# Patient Record
Sex: Male | Born: 1950
Health system: Southern US, Community
[De-identification: ages and names within clinical notes are randomized; demographics above are authoritative.]

## PROBLEM LIST (undated history)

## (undated) DIAGNOSIS — G8929 Other chronic pain: Secondary | ICD-10-CM

## (undated) DIAGNOSIS — I10 Essential (primary) hypertension: Secondary | ICD-10-CM

## (undated) DIAGNOSIS — Z9981 Dependence on supplemental oxygen: Secondary | ICD-10-CM

## (undated) DIAGNOSIS — F419 Anxiety disorder, unspecified: Secondary | ICD-10-CM

## (undated) DIAGNOSIS — Z87891 Personal history of nicotine dependence: Secondary | ICD-10-CM

## (undated) DIAGNOSIS — J449 Chronic obstructive pulmonary disease, unspecified: Secondary | ICD-10-CM

## (undated) DIAGNOSIS — F1120 Opioid dependence, uncomplicated: Secondary | ICD-10-CM

## (undated) DIAGNOSIS — J9690 Respiratory failure, unspecified, unspecified whether with hypoxia or hypercapnia: Secondary | ICD-10-CM

## (undated) DIAGNOSIS — E785 Hyperlipidemia, unspecified: Secondary | ICD-10-CM

## (undated) DIAGNOSIS — I2510 Atherosclerotic heart disease of native coronary artery without angina pectoris: Secondary | ICD-10-CM

## (undated) HISTORY — DX: Atherosclerotic heart disease of native coronary artery without angina pectoris: I25.10

## (undated) HISTORY — DX: Respiratory failure, unspecified, unspecified whether with hypoxia or hypercapnia: J96.90

## (undated) HISTORY — DX: Anxiety disorder, unspecified: F41.9

## (undated) HISTORY — DX: Opioid dependence, uncomplicated: F11.20

## (undated) HISTORY — DX: Hyperlipidemia, unspecified: E78.5

## (undated) HISTORY — DX: Essential (primary) hypertension: I10

## (undated) HISTORY — DX: Other chronic pain: G89.29

## (undated) HISTORY — DX: Personal history of nicotine dependence: Z87.891

---

## 2002-07-22 ENCOUNTER — Encounter: Payer: Self-pay | Admitting: Family Medicine

## 2002-07-22 ENCOUNTER — Ambulatory Visit (HOSPITAL_COMMUNITY): Admission: RE | Admit: 2002-07-22 | Discharge: 2002-07-22 | Payer: Self-pay | Admitting: Family Medicine

## 2002-12-10 ENCOUNTER — Emergency Department (HOSPITAL_COMMUNITY): Admission: EM | Admit: 2002-12-10 | Discharge: 2002-12-10 | Payer: Self-pay | Admitting: *Deleted

## 2003-11-08 ENCOUNTER — Emergency Department (HOSPITAL_COMMUNITY): Admission: EM | Admit: 2003-11-08 | Discharge: 2003-11-08 | Payer: Self-pay | Admitting: Emergency Medicine

## 2004-11-17 ENCOUNTER — Emergency Department (HOSPITAL_COMMUNITY): Admission: EM | Admit: 2004-11-17 | Discharge: 2004-11-17 | Payer: Self-pay | Admitting: Emergency Medicine

## 2005-05-05 ENCOUNTER — Ambulatory Visit: Payer: Self-pay | Admitting: Cardiology

## 2005-05-06 ENCOUNTER — Ambulatory Visit (HOSPITAL_COMMUNITY): Admission: AD | Admit: 2005-05-06 | Discharge: 2005-05-06 | Payer: Self-pay | Admitting: Cardiology

## 2005-05-06 ENCOUNTER — Ambulatory Visit: Payer: Self-pay | Admitting: Cardiology

## 2006-12-14 ENCOUNTER — Ambulatory Visit (HOSPITAL_COMMUNITY): Admission: RE | Admit: 2006-12-14 | Discharge: 2006-12-14 | Payer: Self-pay | Admitting: Family Medicine

## 2007-06-15 ENCOUNTER — Ambulatory Visit (HOSPITAL_COMMUNITY): Admission: RE | Admit: 2007-06-15 | Discharge: 2007-06-15 | Payer: Self-pay | Admitting: Family Medicine

## 2008-06-22 ENCOUNTER — Ambulatory Visit (HOSPITAL_COMMUNITY): Admission: RE | Admit: 2008-06-22 | Discharge: 2008-06-22 | Payer: Self-pay | Admitting: Family Medicine

## 2008-07-20 HISTORY — PX: NM MYOCAR PERF WALL MOTION: HXRAD629

## 2008-07-20 HISTORY — PX: TRANSTHORACIC ECHOCARDIOGRAM: SHX275

## 2008-08-10 ENCOUNTER — Ambulatory Visit (HOSPITAL_COMMUNITY): Admission: RE | Admit: 2008-08-10 | Discharge: 2008-08-10 | Payer: Self-pay | Admitting: Cardiology

## 2008-08-10 HISTORY — PX: CARDIAC CATHETERIZATION: SHX172

## 2008-08-16 ENCOUNTER — Ambulatory Visit: Payer: Self-pay | Admitting: Orthopedic Surgery

## 2008-08-16 DIAGNOSIS — M7580 Other shoulder lesions, unspecified shoulder: Secondary | ICD-10-CM

## 2008-08-16 DIAGNOSIS — M25519 Pain in unspecified shoulder: Secondary | ICD-10-CM

## 2008-08-21 ENCOUNTER — Encounter: Payer: Self-pay | Admitting: Orthopedic Surgery

## 2008-08-21 ENCOUNTER — Encounter (HOSPITAL_COMMUNITY): Admission: RE | Admit: 2008-08-21 | Discharge: 2008-09-29 | Payer: Self-pay | Admitting: Orthopedic Surgery

## 2008-08-23 ENCOUNTER — Encounter: Payer: Self-pay | Admitting: Orthopedic Surgery

## 2008-09-20 ENCOUNTER — Encounter: Payer: Self-pay | Admitting: Orthopedic Surgery

## 2008-10-02 ENCOUNTER — Encounter (HOSPITAL_COMMUNITY): Admission: RE | Admit: 2008-10-02 | Discharge: 2008-11-01 | Payer: Self-pay | Admitting: Orthopedic Surgery

## 2008-10-04 ENCOUNTER — Encounter: Payer: Self-pay | Admitting: Orthopedic Surgery

## 2008-10-19 ENCOUNTER — Ambulatory Visit: Payer: Self-pay | Admitting: Orthopedic Surgery

## 2009-02-08 ENCOUNTER — Ambulatory Visit (HOSPITAL_COMMUNITY): Admission: RE | Admit: 2009-02-08 | Discharge: 2009-02-08 | Payer: Self-pay | Admitting: Family Medicine

## 2009-07-23 ENCOUNTER — Ambulatory Visit (HOSPITAL_COMMUNITY): Admission: RE | Admit: 2009-07-23 | Discharge: 2009-07-23 | Payer: Self-pay | Admitting: Family Medicine

## 2009-09-17 ENCOUNTER — Emergency Department (HOSPITAL_COMMUNITY): Admission: EM | Admit: 2009-09-17 | Discharge: 2009-09-17 | Payer: Self-pay | Admitting: Emergency Medicine

## 2010-02-04 ENCOUNTER — Ambulatory Visit (HOSPITAL_COMMUNITY)
Admission: RE | Admit: 2010-02-04 | Discharge: 2010-02-04 | Payer: Self-pay | Source: Home / Self Care | Admitting: General Surgery

## 2010-04-29 ENCOUNTER — Ambulatory Visit (HOSPITAL_COMMUNITY)
Admission: RE | Admit: 2010-04-29 | Discharge: 2010-04-29 | Payer: Self-pay | Source: Home / Self Care | Attending: Family Medicine | Admitting: Family Medicine

## 2010-07-04 LAB — CBC
Hemoglobin: 13.5 g/dL (ref 13.0–17.0)
MCH: 31.4 pg (ref 26.0–34.0)
MCHC: 34 g/dL (ref 30.0–36.0)
MCV: 92.4 fL (ref 78.0–100.0)
RBC: 4.31 MIL/uL (ref 4.22–5.81)

## 2010-07-04 LAB — BASIC METABOLIC PANEL
CO2: 27 mEq/L (ref 19–32)
Chloride: 105 mEq/L (ref 96–112)
GFR calc Af Amer: >60 mL/min (ref >60)
Glucose, Bld: 98 mg/dL (ref 70–99)
Sodium: 139 mEq/L (ref 135–145)

## 2010-07-31 LAB — POCT I-STAT 3, ART BLOOD GAS (G3+)
Bicarbonate: 23.1 mEq/L (ref 20.0–24.0)
O2 Saturation: 97 %
TCO2: 24 mmol/L (ref 0–100)
pCO2 arterial: 42.3 mmHg (ref 35.0–45.0)
pO2, Arterial: 99 mmHg (ref 80.0–100.0)

## 2010-07-31 LAB — POCT I-STAT 3, VENOUS BLOOD GAS (G3P V)
Acid-base deficit: 4 mmol/L — ABNORMAL HIGH (ref 0.0–2.0)
O2 Saturation: 68 %
TCO2: 24 mmol/L (ref 0–100)

## 2010-09-06 NOTE — Cardiovascular Report (Signed)
NAME:  [name], [name] NO.:  [id number]   MEDICAL RECORD NO.:  [id number]          PATIENT TYPE:  INP   LOCATION:  2857                         FACILITY:  [facility]   PHYSICIAN:  Rollene Rotunda, M.D.   DATE OF BIRTH:  1950-11-11   DATE OF PROCEDURE:  05/06/2005  DATE OF DISCHARGE:                              CARDIAC CATHETERIZATION   PROCEDURE:  Left heart catheterization/coronary arteriography.   INDICATIONS FOR PROCEDURE:  Patient with a chest pain suggestive of unstable  angina.  He had multiple cardiovascular risk factors.   PROCEDURE NOTE:  Left heart catheterization performed via the right femoral  artery.  The artery was cannulated using anterior wall puncture.  A #6  French arterial sheath was inserted via modified Seldinger technique.  Preformed Judkins and a pigtail catheter were utilized.  The groin was  closed.  The patient tolerated the procedure well and left the lab in stable  condition.   RESULTS:  Hemodynamics:  LV 108/12, AO 113/82.   Coronaries:  The left main was normal.  The LAD was a large vessel wrapping  the apex.  It was normal.  There was a  mid diagonal which was moderate size  and normal.  The circumflex was moderate size and normal throughout its  course.  There was an OM1 and OM2 which were both moderate size and normal.  The right coronary artery was a large dominant vessel.  The PDA was large  and normal.  The posterolateral was small and normal.   Left ventriculogram:  The left ventriculogram was obtained in the RAO  projection.  The EF was 60% and normal.   CONCLUSION:  Normal coronaries.  Normal left ventricular function.   PLAN:  No further cardiac workup is suggested.  The patient will follow up  with Dr. Dimas Aguas for primary risk reduction and discussion of non-anginal  chest pain.           ______________________________  Rollene Rotunda, M.D.     JH/MEDQ  D:  05/06/2005  T:  05/06/2005  Job:  [id number]   cc:   Selinda Flavin, M.D.

## 2010-09-06 NOTE — Discharge Summary (Signed)
NAME:  [name], [name]                 ACCOUNT NO.:  [id number]   MEDICAL RECORD NO.:  [id number]          PATIENT TYPE:  INP   LOCATION:  2857                         FACILITY:  [facility]   PHYSICIAN:  Gene Serpe, P.A. [facility]   DATE OF BIRTH:  July 14, 1950   DATE OF ADMISSION:  05/06/2005  DATE OF DISCHARGE:  05/06/2005                                 DISCHARGE SUMMARY   PRIMARY CARDIOLOGIST:  Learta Codding, M.D. [facility]   PRINCIPAL DIAGNOSES:  1.  Non-cardiac chest pain.      1.  Normal coronary angiogram on May 06, 2005.   SECONDARY DIAGNOSES:  1.  Chronic obstructive pulmonary disease/emphysema.  2.  Ongoing tobacco.   REASON FOR ADMISSION:  Mr. Schloemer is a 60 year old male, with no prior  cardiac history, who initially presented to [facility] with a  complaint of dizziness and chest pain. He was referred to Dr. Lewayne Bunting in  consultation. Serial cardiac markers were negative, but resting  electrocardiogram was abnormal with question of prior anterior septal  myocardial infarction. Dr. Andee Lineman, therefore, recommended subsequent  transfer for diagnostic coronary angiography.   Following transfer, the patient underwent coronary angiography by Dr. Rollene Rotunda (see report for full details) revealing normal coronary arteries  and normal left ventricular function.   No further cardiac workup was recommended by Dr. Antoine Poche. The patient was  cleared for discharge later the same day.   The patient was not on any regular medications prior to admission; he will  be discharged on the same.   FOLLOWUP:  The patient will follow up with Dr. Lewayne Bunting in [place] on January  30th at 2:30 p.m.   The patient also instructed to arrange follow-up with Dr. Selinda Flavin, to  establish as her primary care physician.   The patient is strongly urged to stop smoking tobacco.   DISCHARGE DURATION:  Less than 30 minutes.      Gene Serpe, P.A. [facility]     GS/MEDQ  D:  05/06/2005  T:   05/06/2005  Job:  [id number]

## 2011-06-03 DIAGNOSIS — G8929 Other chronic pain: Secondary | ICD-10-CM | POA: Diagnosis not present

## 2011-06-03 DIAGNOSIS — Z6825 Body mass index (BMI) 25.0-25.9, adult: Secondary | ICD-10-CM | POA: Diagnosis not present

## 2011-06-25 DIAGNOSIS — J449 Chronic obstructive pulmonary disease, unspecified: Secondary | ICD-10-CM | POA: Diagnosis not present

## 2011-06-25 DIAGNOSIS — E782 Mixed hyperlipidemia: Secondary | ICD-10-CM | POA: Diagnosis not present

## 2011-06-30 DIAGNOSIS — M542 Cervicalgia: Secondary | ICD-10-CM | POA: Diagnosis not present

## 2011-06-30 DIAGNOSIS — Z6825 Body mass index (BMI) 25.0-25.9, adult: Secondary | ICD-10-CM | POA: Diagnosis not present

## 2011-06-30 DIAGNOSIS — I2510 Atherosclerotic heart disease of native coronary artery without angina pectoris: Secondary | ICD-10-CM | POA: Diagnosis not present

## 2011-06-30 DIAGNOSIS — J449 Chronic obstructive pulmonary disease, unspecified: Secondary | ICD-10-CM | POA: Diagnosis not present

## 2011-06-30 DIAGNOSIS — G8929 Other chronic pain: Secondary | ICD-10-CM | POA: Diagnosis not present

## 2011-09-22 DIAGNOSIS — Z6826 Body mass index (BMI) 26.0-26.9, adult: Secondary | ICD-10-CM | POA: Diagnosis not present

## 2011-09-22 DIAGNOSIS — I2510 Atherosclerotic heart disease of native coronary artery without angina pectoris: Secondary | ICD-10-CM | POA: Diagnosis not present

## 2011-09-22 DIAGNOSIS — J449 Chronic obstructive pulmonary disease, unspecified: Secondary | ICD-10-CM | POA: Diagnosis not present

## 2011-09-22 DIAGNOSIS — G8929 Other chronic pain: Secondary | ICD-10-CM | POA: Diagnosis not present

## 2011-11-28 DIAGNOSIS — M6200 Separation of muscle (nontraumatic), unspecified site: Secondary | ICD-10-CM | POA: Diagnosis not present

## 2011-11-28 DIAGNOSIS — I2510 Atherosclerotic heart disease of native coronary artery without angina pectoris: Secondary | ICD-10-CM | POA: Diagnosis not present

## 2011-12-23 DIAGNOSIS — J449 Chronic obstructive pulmonary disease, unspecified: Secondary | ICD-10-CM | POA: Diagnosis not present

## 2011-12-23 DIAGNOSIS — I2510 Atherosclerotic heart disease of native coronary artery without angina pectoris: Secondary | ICD-10-CM | POA: Diagnosis not present

## 2012-01-22 DIAGNOSIS — G8929 Other chronic pain: Secondary | ICD-10-CM | POA: Diagnosis not present

## 2012-01-22 DIAGNOSIS — Z23 Encounter for immunization: Secondary | ICD-10-CM | POA: Diagnosis not present

## 2012-01-22 DIAGNOSIS — B9789 Other viral agents as the cause of diseases classified elsewhere: Secondary | ICD-10-CM | POA: Diagnosis not present

## 2012-01-22 DIAGNOSIS — J069 Acute upper respiratory infection, unspecified: Secondary | ICD-10-CM | POA: Diagnosis not present

## 2012-03-29 DIAGNOSIS — I2510 Atherosclerotic heart disease of native coronary artery without angina pectoris: Secondary | ICD-10-CM | POA: Diagnosis not present

## 2012-03-29 DIAGNOSIS — R42 Dizziness and giddiness: Secondary | ICD-10-CM | POA: Diagnosis not present

## 2012-03-29 DIAGNOSIS — M549 Dorsalgia, unspecified: Secondary | ICD-10-CM | POA: Diagnosis not present

## 2012-03-29 DIAGNOSIS — G8929 Other chronic pain: Secondary | ICD-10-CM | POA: Diagnosis not present

## 2012-06-21 DIAGNOSIS — J0100 Acute maxillary sinusitis, unspecified: Secondary | ICD-10-CM | POA: Diagnosis not present

## 2012-06-21 DIAGNOSIS — J209 Acute bronchitis, unspecified: Secondary | ICD-10-CM | POA: Diagnosis not present

## 2012-07-13 DIAGNOSIS — E782 Mixed hyperlipidemia: Secondary | ICD-10-CM | POA: Diagnosis not present

## 2012-07-13 DIAGNOSIS — J449 Chronic obstructive pulmonary disease, unspecified: Secondary | ICD-10-CM | POA: Diagnosis not present

## 2012-07-14 DIAGNOSIS — E782 Mixed hyperlipidemia: Secondary | ICD-10-CM | POA: Diagnosis not present

## 2012-09-20 DIAGNOSIS — Z0000 Encounter for general adult medical examination without abnormal findings: Secondary | ICD-10-CM | POA: Diagnosis not present

## 2012-09-20 DIAGNOSIS — G43909 Migraine, unspecified, not intractable, without status migrainosus: Secondary | ICD-10-CM | POA: Diagnosis not present

## 2012-09-20 DIAGNOSIS — Z79899 Other long term (current) drug therapy: Secondary | ICD-10-CM | POA: Diagnosis not present

## 2012-09-20 DIAGNOSIS — J449 Chronic obstructive pulmonary disease, unspecified: Secondary | ICD-10-CM | POA: Diagnosis not present

## 2012-09-20 DIAGNOSIS — G8929 Other chronic pain: Secondary | ICD-10-CM | POA: Diagnosis not present

## 2012-09-20 DIAGNOSIS — J45909 Unspecified asthma, uncomplicated: Secondary | ICD-10-CM | POA: Diagnosis not present

## 2012-09-20 DIAGNOSIS — Z125 Encounter for screening for malignant neoplasm of prostate: Secondary | ICD-10-CM | POA: Diagnosis not present

## 2012-09-20 DIAGNOSIS — Z6827 Body mass index (BMI) 27.0-27.9, adult: Secondary | ICD-10-CM | POA: Diagnosis not present

## 2012-10-25 ENCOUNTER — Other Ambulatory Visit: Payer: Self-pay | Admitting: *Deleted

## 2012-10-25 MED ORDER — SIMVASTATIN 20 MG PO TABS: 20.0000 mg | ORAL_TABLET | Freq: Every day | ORAL | Status: DC

## 2013-01-04 DIAGNOSIS — I2510 Atherosclerotic heart disease of native coronary artery without angina pectoris: Secondary | ICD-10-CM | POA: Diagnosis not present

## 2013-01-04 DIAGNOSIS — J449 Chronic obstructive pulmonary disease, unspecified: Secondary | ICD-10-CM | POA: Diagnosis not present

## 2013-01-04 DIAGNOSIS — Z6826 Body mass index (BMI) 26.0-26.9, adult: Secondary | ICD-10-CM | POA: Diagnosis not present

## 2013-01-04 DIAGNOSIS — R259 Unspecified abnormal involuntary movements: Secondary | ICD-10-CM | POA: Diagnosis not present

## 2013-01-04 DIAGNOSIS — G259 Extrapyramidal and movement disorder, unspecified: Secondary | ICD-10-CM | POA: Diagnosis not present

## 2013-01-20 DIAGNOSIS — Z23 Encounter for immunization: Secondary | ICD-10-CM | POA: Diagnosis not present

## 2013-01-22 ENCOUNTER — Emergency Department (HOSPITAL_COMMUNITY)
Admission: EM | Admit: 2013-01-22 | Discharge: 2013-01-22 | Disposition: A | Discharge status: Home / Self Care | Payer: Medicare Other | Attending: Emergency Medicine | Admitting: Emergency Medicine

## 2013-01-22 ENCOUNTER — Encounter (HOSPITAL_COMMUNITY): Payer: Self-pay | Admitting: *Deleted

## 2013-01-22 DIAGNOSIS — Z79899 Other long term (current) drug therapy: Secondary | ICD-10-CM | POA: Insufficient documentation

## 2013-01-22 DIAGNOSIS — J449 Chronic obstructive pulmonary disease, unspecified: Secondary | ICD-10-CM | POA: Insufficient documentation

## 2013-01-22 DIAGNOSIS — Z87891 Personal history of nicotine dependence: Secondary | ICD-10-CM | POA: Insufficient documentation

## 2013-01-22 DIAGNOSIS — R42 Dizziness and giddiness: Secondary | ICD-10-CM | POA: Diagnosis not present

## 2013-01-22 DIAGNOSIS — J40 Bronchitis, not specified as acute or chronic: Secondary | ICD-10-CM

## 2013-01-22 DIAGNOSIS — J209 Acute bronchitis, unspecified: Secondary | ICD-10-CM | POA: Diagnosis not present

## 2013-01-22 DIAGNOSIS — J4489 Other specified chronic obstructive pulmonary disease: Secondary | ICD-10-CM | POA: Insufficient documentation

## 2013-01-22 HISTORY — DX: Chronic obstructive pulmonary disease, unspecified: J44.9

## 2013-01-22 MED ORDER — AZITHROMYCIN 250 MG PO TABS: ORAL_TABLET | ORAL | Status: DC

## 2013-01-22 MED ORDER — GUAIFENESIN-CODEINE 100-10 MG/5ML PO SYRP: 10.0000 mL | ORAL_SOLUTION | Freq: Three times a day (TID) | ORAL | Status: DC | PRN

## 2013-01-22 MED ORDER — ALBUTEROL SULFATE HFA 108 (90 BASE) MCG/ACT IN AERS
2.0000 | INHALATION_SPRAY | Freq: Once | RESPIRATORY_TRACT | Status: AC
  Administered 2013-01-22: 2 via RESPIRATORY_TRACT
  Filled 2013-01-22: qty 6.7

## 2013-01-22 NOTE — ED Notes (Signed)
Started w/cough Wednesday, went to [facility] Thursday and recv'd flu shot.  Denies fever, chills.  Productive cough w/yellow blood streaked phlegm. No night sweats.  Also reports dizziness.

## 2013-01-24 NOTE — ED Provider Notes (Signed)
CSN: [id number]     Arrival date & time 01/22/13  1315 History   First MD Initiated Contact with Patient 01/22/13 1347     Chief Complaint  Patient presents with  . Cough  . Dizziness   (Consider location/radiation/quality/duration/timing/severity/associated sxs/prior Treatment) Patient is a 62 y.o. male presenting with cough. The history is provided by the patient.  Cough Cough characteristics:  Productive Sputum characteristics:  Yellow (mixed with few streaks of brigh red blood) Severity:  Moderate Onset quality:  Gradual Duration:  3 days Timing:  Constant Progression:  Unchanged Chronicity:  New Smoker: former smoker.   Context: upper respiratory infection   Context: not sick contacts   Worsened by:  Activity Ineffective treatments:  None tried Associated symptoms: rhinorrhea   Associated symptoms: no chest pain, no chills, no diaphoresis, no ear pain, no fever, no headaches, no myalgias, no rash, no shortness of breath, no sinus congestion, no sore throat, no weight loss and no wheezing   Associated symptoms comment:  Patient also reports dizziness with excessive cough.  He denies night sweats, wt loss or hempytosis Rhinorrhea:    Quality:  Clear   Severity:  Mild   Timing:  Intermittent   Progression:  Unchanged   Past Medical History  Diagnosis Date  . COPD (chronic obstructive pulmonary disease)     emphysema   History reviewed. No pertinent past surgical history. History reviewed. No pertinent family history. History  Substance Use Topics  . Smoking status: Former Games developer  . Smokeless tobacco: Not on file  . Alcohol Use: No    Review of Systems  Constitutional: Negative for fever, chills, weight loss, diaphoresis, activity change and appetite change.  HENT: Positive for congestion and rhinorrhea. Negative for ear pain, sore throat, facial swelling, trouble swallowing, neck pain and neck stiffness.   Eyes: Negative for visual disturbance.  Respiratory:  Positive for cough. Negative for chest tightness, shortness of breath, wheezing and stridor.   Cardiovascular: Negative for chest pain and leg swelling.  Gastrointestinal: Negative for nausea, vomiting, abdominal pain and abdominal distention.  Genitourinary: Negative for flank pain.  Musculoskeletal: Negative for myalgias.  Skin: Negative.  Negative for rash.  Neurological: Positive for dizziness. Negative for seizures, syncope, weakness, numbness and headaches.  Hematological: Negative for adenopathy.  Psychiatric/Behavioral: Negative for confusion.  All other systems reviewed and are negative.    Allergies  Review of patient's allergies indicates no known allergies.  Home Medications   Current Outpatient Rx  Name  Route  Sig  Dispense  Refill  . albuterol (PROVENTIL HFA;VENTOLIN HFA) 108 (90 BASE) MCG/ACT inhaler   Inhalation   Inhale 2 puffs into the lungs every 6 (six) hours as needed for wheezing or shortness of breath.         Marland Kitchen albuterol (PROVENTIL) 2 MG tablet   Oral   Take 2 mg by mouth 2 (two) times daily.         Marland Kitchen omeprazole (PRILOSEC) 40 MG capsule   Oral   Take 40 mg by mouth daily.          Marland Kitchen oxyCODONE-acetaminophen (PERCOCET/ROXICET) 5-325 MG per tablet   Oral   Take 1-2 tablets by mouth every 6 (six) hours as needed.          . simvastatin (ZOCOR) 20 MG tablet   Oral   Take 1 tablet (20 mg total) by mouth at bedtime.   30 tablet   6   . zolpidem (AMBIEN) 10 MG tablet  Oral   Take 10 mg by mouth at bedtime as needed.          Marland Kitchen azithromycin (ZITHROMAX Z-PAK) 250 MG tablet      Take two tablets on day one, then one tab qd days 2-5   6 tablet   0   . guaiFENesin-codeine (ROBITUSSIN AC) 100-10 MG/5ML syrup   Oral   Take 10 mLs by mouth 3 (three) times daily as needed for cough.   120 mL   0    BP 125/86  Pulse 87  Temp(Src) 98.2 F (36.8 C) (Oral)  Resp 17  Ht 5\' 9"  (1.753 m)  Wt 183 lb (83.008 kg)  BMI 27.01 kg/m2  SpO2  94% Physical Exam  Nursing note and vitals reviewed. Constitutional: He is oriented to person, place, and time. He appears well-developed and well-nourished. No distress.  HENT:  Head: Normocephalic and atraumatic.  Right Ear: Tympanic membrane and ear canal normal.  Left Ear: Tympanic membrane and ear canal normal.  Nose: Rhinorrhea present. No mucosal edema. Right sinus exhibits no frontal sinus tenderness. Left sinus exhibits no frontal sinus tenderness.  Mouth/Throat: Uvula is midline, oropharynx is clear and moist and mucous membranes are normal. No oropharyngeal exudate.  Eyes: EOM are normal. Pupils are equal, round, and reactive to light.  Neck: Normal range of motion, full passive range of motion without pain and phonation normal. Neck supple.  Cardiovascular: Normal rate, regular rhythm, normal heart sounds and intact distal pulses.   No murmur heard. Pulmonary/Chest: Effort normal. No stridor. No respiratory distress. He has no wheezes. He has no rales. He exhibits no tenderness.  Coarse lungs sounds bilaterally. No wheezing or rales  Abdominal: Soft. He exhibits no distension and no mass. There is no tenderness. There is no rebound and no guarding.  Musculoskeletal: He exhibits no edema.  Lymphadenopathy:    He has no cervical adenopathy.  Neurological: He is alert and oriented to person, place, and time. He exhibits normal muscle tone. Coordination normal.  Skin: Skin is warm and dry.    ED Course  Procedures (including critical care time) Labs Review Labs Reviewed - No data to display Imaging Review No results found.  MDM   1. Bronchitis      VSS on recheck.  No concerning sx's for TB or PE.  Former smoker with hx of COPD and likely bronchitis.  Will treat with inhaler, zithromax and robitussin AC.  Pt agrees to care plan and appears stable for discharge.  Also agrees to close f/u with his PMD or to return here if his sx's worsen   Vernesha Talbot L. Airica Schwartzkopf,  PA-C 01/24/13 1506

## 2013-01-26 NOTE — ED Provider Notes (Signed)
Medical screening examination/treatment/procedure(s) were performed by non-physician practitioner and as supervising physician I was immediately available for consultation/collaboration.   Roney Marion, MD 01/26/13 [phone]

## 2013-04-05 DIAGNOSIS — J45909 Unspecified asthma, uncomplicated: Secondary | ICD-10-CM | POA: Diagnosis not present

## 2013-04-05 DIAGNOSIS — G4700 Insomnia, unspecified: Secondary | ICD-10-CM | POA: Diagnosis not present

## 2013-04-05 DIAGNOSIS — Z6826 Body mass index (BMI) 26.0-26.9, adult: Secondary | ICD-10-CM | POA: Diagnosis not present

## 2013-04-05 DIAGNOSIS — J449 Chronic obstructive pulmonary disease, unspecified: Secondary | ICD-10-CM | POA: Diagnosis not present

## 2013-04-05 DIAGNOSIS — I2510 Atherosclerotic heart disease of native coronary artery without angina pectoris: Secondary | ICD-10-CM | POA: Diagnosis not present

## 2013-04-12 DIAGNOSIS — Z6826 Body mass index (BMI) 26.0-26.9, adult: Secondary | ICD-10-CM | POA: Diagnosis not present

## 2013-04-12 DIAGNOSIS — I10 Essential (primary) hypertension: Secondary | ICD-10-CM | POA: Diagnosis not present

## 2013-05-20 DIAGNOSIS — Z6826 Body mass index (BMI) 26.0-26.9, adult: Secondary | ICD-10-CM | POA: Diagnosis not present

## 2013-05-20 DIAGNOSIS — G8929 Other chronic pain: Secondary | ICD-10-CM | POA: Diagnosis not present

## 2013-06-21 ENCOUNTER — Encounter: Payer: Self-pay | Admitting: *Deleted

## 2013-06-22 ENCOUNTER — Encounter: Payer: Self-pay | Admitting: Internal Medicine

## 2013-06-23 ENCOUNTER — Encounter: Payer: Self-pay | Admitting: Internal Medicine

## 2013-06-23 ENCOUNTER — Ambulatory Visit (INDEPENDENT_AMBULATORY_CARE_PROVIDER_SITE_OTHER): Payer: Medicare Other | Admitting: Internal Medicine

## 2013-06-23 VITALS — BP 144/86 | HR 71 | Ht 69.0 in | Wt 186.5 lb

## 2013-06-23 DIAGNOSIS — E785 Hyperlipidemia, unspecified: Secondary | ICD-10-CM | POA: Diagnosis not present

## 2013-06-23 DIAGNOSIS — J449 Chronic obstructive pulmonary disease, unspecified: Secondary | ICD-10-CM | POA: Diagnosis not present

## 2013-06-23 MED ORDER — SIMVASTATIN 20 MG PO TABS: 20.0000 mg | ORAL_TABLET | Freq: Every day | ORAL | Status: DC

## 2013-06-23 NOTE — Patient Instructions (Signed)
Your physician wants you to follow-up in: 1 year. You will receive a reminder letter in the mail two months in advance. If you don't receive a letter, please call our office to schedule the follow-up appointment.  Please have Dr. Sherwood GamblerFusco send us your lab results - fax is [phone]

## 2013-06-23 NOTE — Progress Notes (Signed)
OFFICE NOTE  Chief Complaint:  Occasional dyspnea  Primary Care Physician: Cassell SmilesFUSCO,[name], MD  HPI:  [name]  is a 63 year old gentleman with history of COPD, prior smoker who quit in 2009, also cardiac catheterization which was negative in 2010, and dyslipidemia. He has had problems with sleep at night and is on Ambien, basically dependent on that medication now. Otherwise, he has had no chest pain, worsening shortness of breath, palpitations, presyncope, or syncopal symptoms.   PMHx:  Past Medical History  Diagnosis Date  . COPD (chronic obstructive pulmonary disease)     emphysema  . History of tobacco abuse     80 pack year history   . Dyslipidemia     Past Surgical History  Procedure Laterality Date  . Cardiac catheterization  08/10/2008    right & left - normal coronaries (Dr. Claudia DesanctisH. Solomon)  . Transthoracic echocardiogram  07/2008    EF=>55%; RV mildly dilated; RA mild-mod dilated; mild mitral annular calcification & mild MR; AV mildly sclerotic  . Nm myocar perf wall motion  07/2008    bruce myoview - mild ischemia in basal inferoseptal & mid inferoseptal regions; EF 63%    FAMHx:  Family History  Problem Relation Age of Onset  . Leukemia Mother   . Suicidality Father     SOCHx:   reports that he quit smoking about 6 years ago. He has never used smokeless tobacco. He reports that he does not drink alcohol or use illicit drugs.  ALLERGIES:  No Known Allergies  ROS: A comprehensive review of systems was negative except for: Respiratory: positive for chronic bronchitis and dyspnea on exertion  HOME MEDS: Current Outpatient Prescriptions  Medication Sig Dispense Refill  . albuterol (PROVENTIL HFA;VENTOLIN HFA) 108 (90 BASE) MCG/ACT inhaler Inhale 2 puffs into the lungs every 6 (six) hours as needed for wheezing or shortness of breath.      Marland Kitchen. albuterol (PROVENTIL) 2 MG tablet Take 2 mg by mouth 2 (two) times daily.      Marland Kitchen. omeprazole (PRILOSEC) 40 MG  capsule Take 40 mg by mouth daily.       Marland Kitchen. oxyCODONE-acetaminophen (PERCOCET/ROXICET) 5-325 MG per tablet Take 1-2 tablets by mouth every 6 (six) hours as needed.       . simvastatin (ZOCOR) 20 MG tablet Take 1 tablet (20 mg total) by mouth at bedtime.  30 tablet  3  . zolpidem (AMBIEN) 10 MG tablet Take 10 mg by mouth at bedtime as needed.        No current facility-administered medications for this visit.    LABS/IMAGING: No results found for this or any previous visit (from the past 48 hour(s)). No results found.  VITALS: BP 144/86  Pulse 71  Ht 5\' 9"  (1.753 m)  Wt 186 lb 8 oz (84.596 kg)  BMI 27.53 kg/m2  EXAM: General appearance: alert and no distress Neck: no carotid bruit and no JVD Lungs: clear to auscultation bilaterally Heart: regular rate and rhythm, S1, S2 normal, no murmur, click, rub or gallop Extremities: extremities normal, atraumatic, no cyanosis or edema Pulses: 2+ and symmetric Skin: Skin color, texture, turgor normal. No rashes or lesions Neurologic: Grossly normal  EKG: Normal sinus rhythm at 71  ASSESSMENT: 1. Dyslipidemia 2. COPD  PLAN: 1.   Mr. Harriette BouillonStarnes is doing fairly well. He is scheduled to see his primary care provider next week and will have another lipid profile done. I've asked them to send us a copy of  those results. He is having no chest pain and is physically active. He reports his shortness of breath is much better than it was several years ago, but he was smoking at the time. Plan to see him back annually or sooner as necessary.  Chrystie Nose, MD, [facility] Attending Cardiologist [facility]  HILTY,Kenneth C 06/23/2013, 9:55 AM

## 2013-06-30 DIAGNOSIS — I2510 Atherosclerotic heart disease of native coronary artery without angina pectoris: Secondary | ICD-10-CM | POA: Diagnosis not present

## 2013-06-30 DIAGNOSIS — G8929 Other chronic pain: Secondary | ICD-10-CM | POA: Diagnosis not present

## 2013-06-30 DIAGNOSIS — G4700 Insomnia, unspecified: Secondary | ICD-10-CM | POA: Diagnosis not present

## 2013-06-30 DIAGNOSIS — Z6826 Body mass index (BMI) 26.0-26.9, adult: Secondary | ICD-10-CM | POA: Diagnosis not present

## 2013-09-17 ENCOUNTER — Emergency Department (HOSPITAL_COMMUNITY)
Admission: EM | Admit: 2013-09-17 | Discharge: 2013-09-17 | Disposition: A | Discharge status: Home / Self Care | Payer: Medicare HMO | Attending: Emergency Medicine | Admitting: Emergency Medicine

## 2013-09-17 ENCOUNTER — Other Ambulatory Visit: Payer: Self-pay

## 2013-09-17 ENCOUNTER — Encounter (HOSPITAL_COMMUNITY): Payer: Self-pay | Admitting: Emergency Medicine

## 2013-09-17 DIAGNOSIS — E785 Hyperlipidemia, unspecified: Secondary | ICD-10-CM | POA: Insufficient documentation

## 2013-09-17 DIAGNOSIS — F411 Generalized anxiety disorder: Secondary | ICD-10-CM | POA: Insufficient documentation

## 2013-09-17 DIAGNOSIS — J438 Other emphysema: Secondary | ICD-10-CM | POA: Insufficient documentation

## 2013-09-17 DIAGNOSIS — Z9889 Other specified postprocedural states: Secondary | ICD-10-CM | POA: Insufficient documentation

## 2013-09-17 DIAGNOSIS — R1012 Left upper quadrant pain: Secondary | ICD-10-CM | POA: Diagnosis not present

## 2013-09-17 DIAGNOSIS — R109 Unspecified abdominal pain: Secondary | ICD-10-CM

## 2013-09-17 DIAGNOSIS — R002 Palpitations: Secondary | ICD-10-CM | POA: Diagnosis not present

## 2013-09-17 DIAGNOSIS — F419 Anxiety disorder, unspecified: Secondary | ICD-10-CM

## 2013-09-17 DIAGNOSIS — Z79899 Other long term (current) drug therapy: Secondary | ICD-10-CM | POA: Insufficient documentation

## 2013-09-17 DIAGNOSIS — Z87891 Personal history of nicotine dependence: Secondary | ICD-10-CM | POA: Insufficient documentation

## 2013-09-17 LAB — COMPREHENSIVE METABOLIC PANEL
ALBUMIN: 3.7 g/dL (ref 3.5–5.2)
ALK PHOS: 90 U/L (ref 39–117)
ALT: 27 U/L (ref 0–53)
AST: 19 U/L (ref 0–37)
BILIRUBIN TOTAL: 0.4 mg/dL (ref 0.3–1.2)
BUN: 13 mg/dL (ref 6–23)
CHLORIDE: 103 meq/L (ref 96–112)
CO2: 23 mEq/L (ref 19–32)
CREATININE: 0.95 mg/dL (ref 0.50–1.35)
Calcium: 9.1 mg/dL (ref 8.4–10.5)
GFR calc non Af Amer: 87 mL/min — ABNORMAL LOW (ref >90)
GLUCOSE: 142 mg/dL — AB (ref 70–99)
POTASSIUM: 3.8 meq/L (ref 3.7–5.3)
Sodium: 140 mEq/L (ref 137–147)
Total Protein: 6.6 g/dL (ref 6.0–8.3)

## 2013-09-17 LAB — CBC WITH DIFFERENTIAL/PLATELET
BASOS PCT: 1 % (ref 0–1)
Basophils Absolute: 0 10*3/uL (ref 0.0–0.1)
EOS ABS: 0.1 10*3/uL (ref 0.0–0.7)
Eosinophils Relative: 2 % (ref 0–5)
HEMATOCRIT: 40 % (ref 39.0–52.0)
HEMOGLOBIN: 13.8 g/dL (ref 13.0–17.0)
LYMPHS ABS: 1 10*3/uL (ref 0.7–4.0)
Lymphocytes Relative: 19 % (ref 12–46)
MCH: 30.9 pg (ref 26.0–34.0)
MCHC: 34.5 g/dL (ref 30.0–36.0)
MCV: 89.7 fL (ref 78.0–100.0)
MONO ABS: 0.4 10*3/uL (ref 0.1–1.0)
MONOS PCT: 7 % (ref 3–12)
NEUTROS ABS: 3.9 10*3/uL (ref 1.7–7.7)
Neutrophils Relative %: 71 % (ref 43–77)
Platelets: 271 10*3/uL (ref 150–400)
RBC: 4.46 MIL/uL (ref 4.22–5.81)
RDW: 12.7 % (ref 11.5–15.5)
WBC: 5.5 10*3/uL (ref 4.0–10.5)

## 2013-09-17 LAB — LIPASE, BLOOD: LIPASE: 36 U/L (ref 11–59)

## 2013-09-17 LAB — TROPONIN I

## 2013-09-17 MED ORDER — LORAZEPAM 1 MG PO TABS: 1.0000 mg | ORAL_TABLET | Freq: Two times a day (BID) | ORAL | Status: DC | PRN

## 2013-09-17 MED ORDER — LORAZEPAM 1 MG PO TABS
1.0000 mg | ORAL_TABLET | Freq: Once | ORAL | Status: AC
  Administered 2013-09-17: 1 mg via ORAL
  Filled 2013-09-17: qty 1
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
## 2013-09-17 NOTE — Discharge Instructions (Signed)
Tests were normal. Small prescription for anxiety medicine.  Uses medication sparingly as it can be addictive.

## 2013-09-17 NOTE — ED Notes (Signed)
Pt c/o left upper quad abd pain that started thirty minutes ago with suden onset, pt also states that he felt like his heart was beating fast and became sob when the pain started,

## 2013-10-13 ENCOUNTER — Ambulatory Visit (INDEPENDENT_AMBULATORY_CARE_PROVIDER_SITE_OTHER): Payer: Medicare HMO | Admitting: Cardiovascular Disease

## 2013-10-13 ENCOUNTER — Encounter: Payer: Self-pay | Admitting: Cardiovascular Disease

## 2013-10-13 ENCOUNTER — Encounter: Payer: Self-pay | Admitting: *Deleted

## 2013-10-13 VITALS — BP 130/80 | HR 70 | Resp 11 | Ht 69.0 in | Wt 191.0 lb

## 2013-10-13 DIAGNOSIS — F411 Generalized anxiety disorder: Secondary | ICD-10-CM

## 2013-10-13 DIAGNOSIS — R079 Chest pain, unspecified: Secondary | ICD-10-CM

## 2013-10-13 DIAGNOSIS — E785 Hyperlipidemia, unspecified: Secondary | ICD-10-CM

## 2013-10-13 DIAGNOSIS — J42 Unspecified chronic bronchitis: Secondary | ICD-10-CM

## 2013-10-13 DIAGNOSIS — I2510 Atherosclerotic heart disease of native coronary artery without angina pectoris: Secondary | ICD-10-CM | POA: Insufficient documentation

## 2013-10-13 DIAGNOSIS — F419 Anxiety disorder, unspecified: Secondary | ICD-10-CM

## 2013-10-13 MED ORDER — SIMVASTATIN 20 MG PO TABS: 20.0000 mg | ORAL_TABLET | Freq: Every day | ORAL | Status: DC
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
## 2013-10-13 NOTE — Assessment & Plan Note (Signed)
Chronic issue  Continue ambian and valium  F/U [employer]co

## 2013-10-13 NOTE — Assessment & Plan Note (Signed)
Zocor not on primary med list.  No recent labs  Started by Dr Rennis GoldenHilty  Script called in will get fasting lipids when he comes for myovue

## 2013-10-13 NOTE — Assessment & Plan Note (Signed)
Clear lungs with no wheezing  Quit smoking Yearly CXR proair and albuterol as needed
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
## 2013-10-13 NOTE — Patient Instructions (Signed)
Your physician wants you to follow-up in:   1 year with Dr.Hilty You will receive a reminder letter in the mail two months in advance. If you don't receive a letter, please call our office to schedule the follow-up appointment.     Your physician recommends that you continue on your current medications as directed. Please refer to the Current Medication list given to you today.    Your physician has requested that you have en exercise stress myoview. For further information please visit [email or web address]. Please follow instruction sheet, as given.     Thank you for choosing [facility] !

## 2013-10-19 ENCOUNTER — Encounter (HOSPITAL_COMMUNITY)
Admission: RE | Admit: 2013-10-19 | Discharge: 2013-10-19 | Disposition: A | Discharge status: Home / Self Care | Payer: Medicare HMO | Source: Ambulatory Visit | Attending: Cardiovascular Disease | Admitting: Cardiovascular Disease

## 2013-10-19 ENCOUNTER — Encounter (HOSPITAL_COMMUNITY): Payer: Self-pay

## 2013-10-19 ENCOUNTER — Ambulatory Visit (HOSPITAL_COMMUNITY)
Admission: RE | Admit: 2013-10-19 | Discharge: 2013-10-19 | Disposition: A | Discharge status: Home / Self Care | Payer: Medicare HMO | Source: Ambulatory Visit | Attending: Cardiovascular Disease | Admitting: Cardiovascular Disease

## 2013-10-19 DIAGNOSIS — F411 Generalized anxiety disorder: Secondary | ICD-10-CM | POA: Insufficient documentation

## 2013-10-19 DIAGNOSIS — R079 Chest pain, unspecified: Secondary | ICD-10-CM

## 2013-10-19 LAB — LIPID PANEL
CHOL/HDL RATIO: 3.3 ratio
CHOLESTEROL: 137 mg/dL (ref 0–200)
HDL: 42 mg/dL (ref >39)
LDL Cholesterol: 77 mg/dL (ref 0–99)
TRIGLYCERIDES: 91 mg/dL (ref <150)
VLDL: 18 mg/dL (ref 0–40)

## 2013-10-19 MED ORDER — SODIUM CHLORIDE 0.9 % IJ SOLN
INTRAMUSCULAR | Status: AC
  Administered 2013-10-19: 10 mL via INTRAVENOUS
  Filled 2013-10-19: qty 10

## 2013-10-19 MED ORDER — REGADENOSON 0.4 MG/5ML IV SOLN
0.4000 mg | Freq: Once | INTRAVENOUS | Status: AC | PRN
  Administered 2013-10-19: 0.4 mg via INTRAVENOUS

## 2013-10-19 MED ORDER — TECHNETIUM TC 99M SESTAMIBI GENERIC - CARDIOLITE
10.0000 | Freq: Once | INTRAVENOUS | Status: AC | PRN
  Administered 2013-10-19: 10 via INTRAVENOUS

## 2013-10-19 MED ORDER — REGADENOSON 0.4 MG/5ML IV SOLN
INTRAVENOUS | Status: AC
  Administered 2013-10-19: 0.4 mg via INTRAVENOUS
  Filled 2013-10-19: qty 5

## 2013-10-19 MED ORDER — TECHNETIUM TC 99M SESTAMIBI - CARDIOLITE
30.0000 | Freq: Once | INTRAVENOUS | Status: AC | PRN
  Administered 2013-10-19: 11:00:00 30 via INTRAVENOUS

## 2013-10-19 MED ORDER — SODIUM CHLORIDE 0.9 % IJ SOLN
10.0000 mL | INTRAMUSCULAR | Status: DC | PRN
  Administered 2013-10-19: 10 mL via INTRAVENOUS

## 2013-10-19 NOTE — Progress Notes (Signed)
Stress Lab Nurses Notes - Jeani Hawkingnnie Penn  Fabio NeighborsRoy L Tarter 10/19/2013 Reason for doing test: Chest Pain and Anxiety Type of test: Marlane HatcherLexiscan Cardiolite Nurse performing test: Parke PoissonPhyllis Billingsly, RN Nuclear Medicine Tech: Lyndel Pleasureyan Liles Echo Tech: Not Applicable MD performing test: S. McDowell/M.Geni BersLenze PA Family MD: [name] Test explained and consent signed: Yes.   IV started: 22g jelco, Saline lock flushed, No redness or edema and Saline lock started in radiology Symptoms: Dizziness Treatment/Intervention: None Reason test stopped: protocol completed After recovery IV was: Discontinued via X-ray tech and No redness or edema Patient to return to Nuc. Med at :11:30 Patient discharged: Home Patient's Condition upon discharge was: stable Comments: During test BP 114/77 & HR 107.  Recovery BP 109/81 & HR 94.  Symptoms resolved in recovery. Erskine SpeedBillingsley, Lorijean Husser T

## 2014-06-23 ENCOUNTER — Ambulatory Visit (INDEPENDENT_AMBULATORY_CARE_PROVIDER_SITE_OTHER): Payer: Medicare HMO | Admitting: Internal Medicine

## 2014-06-23 ENCOUNTER — Encounter: Payer: Self-pay | Admitting: Internal Medicine

## 2014-06-23 VITALS — BP 130/96 | HR 84 | Ht 69.0 in | Wt 193.5 lb

## 2014-06-23 DIAGNOSIS — F419 Anxiety disorder, unspecified: Secondary | ICD-10-CM | POA: Diagnosis not present

## 2014-06-23 DIAGNOSIS — J438 Other emphysema: Secondary | ICD-10-CM | POA: Diagnosis not present

## 2014-06-23 DIAGNOSIS — I2583 Coronary atherosclerosis due to lipid rich plaque: Principal | ICD-10-CM

## 2014-06-23 DIAGNOSIS — E785 Hyperlipidemia, unspecified: Secondary | ICD-10-CM | POA: Diagnosis not present

## 2014-06-23 DIAGNOSIS — I2510 Atherosclerotic heart disease of native coronary artery without angina pectoris: Secondary | ICD-10-CM

## 2014-06-23 MED ORDER — SIMVASTATIN 20 MG PO TABS: 20.0000 mg | ORAL_TABLET | Freq: Every day | ORAL | Status: DC

## 2014-06-23 NOTE — Patient Instructions (Addendum)
Your physician wants you to follow-up in: 1 year with Dr. Rennis GoldenHilty. You will receive a reminder letter in the mail two months in advance. If you don't receive a letter, please call our office to schedule the follow-up appointment.  Your simvastatin has been refilled.  Dr. Rennis GoldenHilty recommends trying magnesium oxide over the counter for cramps.

## 2014-06-23 NOTE — Progress Notes (Signed)
OFFICE NOTE  Chief Complaint:  Occasional dyspnea  Primary Care Physician: Cassell Smiles., MD  HPI:  [name]  is a 64 year old gentleman with history of COPD, prior smoker who quit in 2009, also cardiac catheterization which was negative in 2010, and dyslipidemia. He has had problems with sleep at night and is on Ambien, basically dependent on that medication now. Otherwise, he has had no chest pain, worsening shortness of breath, palpitations, presyncope, or syncopal symptoms.   I saw [name] back in the office today. He really has no significant coronary disease. He is on simvastatin for cholesterol reduction and had a very reasonable cholesterol profile which was a goal this past summer. For some reason he saw Dr. Eden Emms, but I followed him in the past. He recently was in the hospital for an anxiety attack and was placed on Valium which she's taking 3 times a day. Although its said as needed, he takes it very irregularly. He denies any complaints. He does occasionally gets some leg cramps.  PMHx:  Past Medical History  Diagnosis Date  . COPD (chronic obstructive pulmonary disease)     emphysema  . History of tobacco abuse     80 pack year history   . Dyslipidemia     Past Surgical History  Procedure Laterality Date  . Cardiac catheterization  08/10/2008    right & left - normal coronaries (Dr. Claudia Desanctis)  . Transthoracic echocardiogram  07/2008    EF=>55%; RV mildly dilated; RA mild-mod dilated; mild mitral annular calcification & mild MR; AV mildly sclerotic  . Nm myocar perf wall motion  07/2008    bruce myoview - mild ischemia in basal inferoseptal & mid inferoseptal regions; EF 63%    FAMHx:  Family History  Problem Relation Age of Onset  . Leukemia Mother   . Suicidality Father     SOCHx:   reports that he quit smoking about 7 years ago. He has never used smokeless tobacco. He reports that he does not drink alcohol or use illicit drugs.  ALLERGIES:    Allergies  Allergen Reactions  . Ativan [Lorazepam] Other (See Comments)    Dry mouth sinus and breathing problems     ROS: A comprehensive review of systems was negative except for: Respiratory: positive for chronic bronchitis and dyspnea on exertion  HOME MEDS: Current Outpatient Prescriptions  Medication Sig Dispense Refill  . albuterol (PROVENTIL HFA;VENTOLIN HFA) 108 (90 BASE) MCG/ACT inhaler Inhale 2 puffs into the lungs every 6 (six) hours as needed for wheezing or shortness of breath.    Marland Kitchen albuterol (PROVENTIL) 2 MG tablet Take 2 mg by mouth 2 (two) times daily.    . diazepam (VALIUM) 10 MG tablet Take 10 mg by mouth every 8 (eight) hours as needed for anxiety.     Marland Kitchen omeprazole (PRILOSEC) 40 MG capsule Take 40 mg by mouth 2 (two) times daily as needed.     Marland Kitchen oxyCODONE-acetaminophen (PERCOCET) 7.5-325 MG per tablet Take 1 tablet by mouth every 4 (four) hours as needed for pain.    . simvastatin (ZOCOR) 20 MG tablet Take 1 tablet (20 mg total) by mouth at bedtime. 90 tablet 3   No current facility-administered medications for this visit.    LABS/IMAGING: No results found for this or any previous visit (from the past 48 hour(s)). No results found.  VITALS: BP 130/96 mmHg  Pulse 84  Ht  (1.753 m)  Wt 193 lb 8 oz (87.771  kg)  BMI 28.56 kg/m2  EXAM: General appearance: alert and no distress Neck: no carotid bruit and no JVD Lungs: clear to auscultation bilaterally Heart: regular rate and rhythm, S1, S2 normal, no murmur, click, rub or gallop Extremities: extremities normal, atraumatic, no cyanosis or edema Pulses: 2+ and symmetric Skin: Skin color, texture, turgor normal. No rashes or lesions Neurologic: Grossly normal  EKG: Normal sinus rhythm at 63  ASSESSMENT: 1. Dyslipidemia 2. COPD 3. Leg cramps 4. Anxiety  PLAN: 1.   [name] says that his shortness of breath is improved with treatment for his anxiety. He is also using inhalers for COPD. His  cholesterol is well controlled on simvastatin and we'll refill that medication today. His leg cramps are mostly at night and I recommended him taking magnesium for that. From a cardiac standpoint there is no active coronary disease and I'm aware of. Plan to see him back annually or sooner as necessary.  Chrystie NoseKenneth C. Bryann Gentz, MD, [facility] Center[facility]ACC Attending Cardiologist [facility]  Eveline Sauve C 06/23/2014, 9:52 AM

## 2014-07-04 DIAGNOSIS — F419 Anxiety disorder, unspecified: Secondary | ICD-10-CM | POA: Diagnosis not present

## 2014-07-04 DIAGNOSIS — M2662 Arthralgia of temporomandibular joint: Secondary | ICD-10-CM | POA: Diagnosis not present

## 2014-07-04 DIAGNOSIS — R52 Pain, unspecified: Secondary | ICD-10-CM | POA: Diagnosis not present

## 2014-07-04 DIAGNOSIS — Z6827 Body mass index (BMI) 27.0-27.9, adult: Secondary | ICD-10-CM | POA: Diagnosis not present

## 2014-07-10 DIAGNOSIS — J069 Acute upper respiratory infection, unspecified: Secondary | ICD-10-CM | POA: Diagnosis not present

## 2014-07-10 DIAGNOSIS — J209 Acute bronchitis, unspecified: Secondary | ICD-10-CM | POA: Diagnosis not present

## 2014-07-10 DIAGNOSIS — Z6827 Body mass index (BMI) 27.0-27.9, adult: Secondary | ICD-10-CM | POA: Diagnosis not present

## 2014-07-27 ENCOUNTER — Telehealth: Payer: Self-pay | Admitting: Internal Medicine

## 2014-07-27 NOTE — Telephone Encounter (Signed)
Advised magnesium oxide per last OV note. Pt voiced understanding

## 2014-07-27 NOTE — Telephone Encounter (Signed)
Pt said Dr Rennis GoldenHilty told him a medicine he could get over the counter for cramps. Pt says he forgot what he said.

## 2015-03-22 ENCOUNTER — Encounter (HOSPITAL_COMMUNITY): Payer: Self-pay

## 2015-03-22 ENCOUNTER — Emergency Department (HOSPITAL_COMMUNITY): Payer: Medicare Other

## 2015-03-22 ENCOUNTER — Emergency Department (HOSPITAL_COMMUNITY)
Admission: EM | Admit: 2015-03-22 | Discharge: 2015-03-22 | Discharge status: Against Medical Advice | Payer: Medicare Other | Attending: Emergency Medicine | Admitting: Emergency Medicine

## 2015-03-22 DIAGNOSIS — J441 Chronic obstructive pulmonary disease with (acute) exacerbation: Secondary | ICD-10-CM | POA: Diagnosis not present

## 2015-03-22 DIAGNOSIS — Z79899 Other long term (current) drug therapy: Secondary | ICD-10-CM | POA: Diagnosis not present

## 2015-03-22 DIAGNOSIS — Z9889 Other specified postprocedural states: Secondary | ICD-10-CM | POA: Insufficient documentation

## 2015-03-22 DIAGNOSIS — E785 Hyperlipidemia, unspecified: Secondary | ICD-10-CM | POA: Insufficient documentation

## 2015-03-22 DIAGNOSIS — Z87891 Personal history of nicotine dependence: Secondary | ICD-10-CM | POA: Diagnosis not present

## 2015-03-22 DIAGNOSIS — R05 Cough: Secondary | ICD-10-CM | POA: Diagnosis not present

## 2015-03-22 LAB — CBC WITH DIFFERENTIAL/PLATELET
BASOS PCT: 0 %
Basophils Absolute: 0 10*3/uL (ref 0.0–0.1)
Eosinophils Absolute: 0.1 10*3/uL (ref 0.0–0.7)
Eosinophils Relative: 1 %
HCT: 43 % (ref 39.0–52.0)
HEMOGLOBIN: 14.8 g/dL (ref 13.0–17.0)
LYMPHS ABS: 1.5 10*3/uL (ref 0.7–4.0)
Lymphocytes Relative: 13 %
MCH: 31 pg (ref 26.0–34.0)
MCHC: 34.4 g/dL (ref 30.0–36.0)
MCV: 90.1 fL (ref 78.0–100.0)
MONOS PCT: 8 %
Monocytes Absolute: 0.9 10*3/uL (ref 0.1–1.0)
NEUTROS ABS: 8.6 10*3/uL — AB (ref 1.7–7.7)
NEUTROS PCT: 78 %
Platelets: 265 10*3/uL (ref 150–400)
RBC: 4.77 MIL/uL (ref 4.22–5.81)
RDW: 12.5 % (ref 11.5–15.5)
WBC: 11.1 10*3/uL — ABNORMAL HIGH (ref 4.0–10.5)

## 2015-03-22 LAB — COMPREHENSIVE METABOLIC PANEL
ALBUMIN: 4 g/dL (ref 3.5–5.0)
ALT: 20 U/L (ref 17–63)
ANION GAP: 6 (ref 5–15)
AST: 16 U/L (ref 15–41)
Alkaline Phosphatase: 110 U/L (ref 38–126)
BUN: 9 mg/dL (ref 6–20)
CHLORIDE: 105 mmol/L (ref 101–111)
CO2: 27 mmol/L (ref 22–32)
Calcium: 8.9 mg/dL (ref 8.9–10.3)
Creatinine, Ser: 1.14 mg/dL (ref 0.61–1.24)
GFR calc Af Amer: >60 mL/min (ref >60)
GFR calc non Af Amer: >60 mL/min (ref >60)
GLUCOSE: 110 mg/dL — AB (ref 65–99)
POTASSIUM: 4.1 mmol/L (ref 3.5–5.1)
SODIUM: 138 mmol/L (ref 135–145)
Total Bilirubin: 0.8 mg/dL (ref 0.3–1.2)
Total Protein: 7.2 g/dL (ref 6.5–8.1)

## 2015-03-22 MED ORDER — ALBUTEROL (5 MG/ML) CONTINUOUS INHALATION SOLN
15.0000 mg/h | INHALATION_SOLUTION | Freq: Once | RESPIRATORY_TRACT | Status: AC
  Administered 2015-03-22: 15 mg/h via RESPIRATORY_TRACT
  Filled 2015-03-22: qty 20

## 2015-03-22 MED ORDER — PREDNISONE 50 MG PO TABS
60.0000 mg | ORAL_TABLET | Freq: Once | ORAL | Status: AC
  Administered 2015-03-22: 60 mg via ORAL
  Filled 2015-03-22: qty 1

## 2015-03-22 MED ORDER — IPRATROPIUM BROMIDE 0.02 % IN SOLN
0.5000 mg | Freq: Once | RESPIRATORY_TRACT | Status: AC
  Administered 2015-03-22: 0.5 mg via RESPIRATORY_TRACT
  Filled 2015-03-22: qty 2.5

## 2015-03-22 NOTE — ED Notes (Signed)
Pt ambulated on RA sats 88-91%. Findings reported to Dr. Clarene DukeMcManus.

## 2015-03-22 NOTE — ED Notes (Signed)
C/o productive cough X4 days.

## 2015-03-22 NOTE — ED Provider Notes (Signed)
CSN: [id number]     Arrival date & time 03/22/15  0536 History   First MD Initiated Contact with Patient 03/22/15 [phone]   Chief Complaint  Patient presents with  . Cough     (Consider location/radiation/quality/duration/timing/severity/associated sxs/prior Treatment) HPI patient reports on November 28 he started having a cough with yellow sputum production. He denies feeling short of breath but states he does have some wheezing. He states if he uses his inhaler or takes his albuterol pills his breathing improves. He denies any fever or chills, sore throat, nausea or vomiting, diarrhea, or chest pain. He states he has a mild sore throat. He has mild chest tightness. He does report some posttussive gagging but no actual vomiting. Patient states he's not on oxygen at home and he's never had to be admitted for breathing difficulty. He states he did get the flu shot in September and he has had the pneumonia shot but he does not recall how long ago.  PCP Dr Sherwood GamblerFusco  Past Medical History  Diagnosis Date  . 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 History of tobacco abuse     80 pack year history   . Dyslipidemia    Past Surgical History  Procedure Laterality Date  . Cardiac catheterization  08/10/2008    right & left - normal coronaries (Dr. Claudia DesanctisH. Solomon)  . Transthoracic echocardiogram  07/2008    EF=>55%; RV mildly dilated; RA mild-mod dilated; mild mitral annular calcification & mild MR; AV mildly sclerotic  . Nm myocar perf wall motion  07/2008    bruce myoview - mild ischemia in basal inferoseptal & mid inferoseptal regions; EF 63%   Family History  Problem Relation Age of Onset  . Leukemia Mother   . Suicidality Father    Social History  Substance Use Topics  . Smoking status: Former Smoker    Quit date: 06/22/2007  . Smokeless tobacco: Never Used  . Alcohol Use: No  lives at home Lives with spouse who is also in the ED with similar symptoms  Review of  Systems  All other systems reviewed and are negative.     Allergies  Ativan  Home Medications   Prior to Admission medications   Medication Sig Start Date End Date Taking? Authorizing Provider  albuterol (PROVENTIL HFA;VENTOLIN HFA) 108 (90 BASE) MCG/ACT inhaler Inhale 2 puffs into the lungs every 6 (six) hours as needed for wheezing or shortness of breath.   Yes Historical Provider, MD  albuterol (PROVENTIL) 2 MG tablet Take 2 mg by mouth 2 (two) times daily.   Yes Historical Provider, MD  diazepam (VALIUM) 10 MG tablet Take 10 mg by mouth every 8 (eight) hours as needed for anxiety.    Yes Historical Provider, MD  omeprazole (PRILOSEC) 40 MG capsule Take 40 mg by mouth 2 (two) times daily as needed.  01/10/13  Yes Historical Provider, MD  oxyCODONE-acetaminophen (PERCOCET) 7.5-325 MG per tablet Take 1 tablet by mouth every 4 (four) hours as needed for pain.   Yes Historical Provider, MD  simvastatin (ZOCOR) 20 MG tablet Take 1 tablet (20 mg total) by mouth at bedtime. 06/23/14  Yes Chrystie NoseKenneth C Hilty, MD   BP 160/94 mmHg  Pulse 125  Temp(Src) 98.4 F (36.9 C) (Oral)  Resp 24  Ht 5\' 9"  (1.753 m)  Wt 190 lb (86.183 kg)  BMI 28.05 kg/m2  SpO2 94%  Vital signs normal except for tachycardia and hypertension  Physical Exam  Constitutional: He is oriented to person, place, and time. He appears well-developed and well-nourished.  Non-toxic appearance. He does not appear ill. No distress.  HENT:  Head: Normocephalic and atraumatic.  Right Ear: External ear normal.  Left Ear: External ear normal.  Nose: Nose normal. No mucosal edema or rhinorrhea.  Mouth/Throat: Oropharynx is clear and moist and mucous membranes are normal. No dental abscesses or uvula swelling.  Eyes: Conjunctivae and EOM are normal. Pupils are equal, round, and reactive to light.  Neck: Normal range of motion and full passive range of motion without pain. Neck supple.  Cardiovascular: Normal rate, regular rhythm and  normal heart sounds.  Exam reveals no gallop and no friction rub.   No murmur heard. Pulmonary/Chest: He is in respiratory distress. He has decreased breath sounds in the right upper field. He has no wheezes. He has no rhonchi. He has no rales. He exhibits no tenderness and no crepitus.  Has tight cough  Abdominal: Soft. Normal appearance and bowel sounds are normal. He exhibits no distension. There is no tenderness. There is no rebound and no guarding.  Musculoskeletal: Normal range of motion. He exhibits no edema or tenderness.  Moves all extremities well.   Neurological: He is alert and oriented to person, place, and time. He has normal strength. No cranial nerve deficit.  Skin: Skin is warm, dry and intact. No rash noted. No erythema. No pallor.  Psychiatric: He has a normal mood and affect. His speech is normal and behavior is normal. His mood appears not anxious.  Nursing note and vitals reviewed.   ED Course  Procedures (including critical care time)  Medications  albuterol (PROVENTIL,VENTOLIN) solution continuous neb (15 mg/hr Nebulization Given 03/22/15 0633)  ipratropium (ATROVENT) nebulizer solution 0.5 mg (0.5 mg Nebulization Given 03/22/15 0633)  predniSONE (DELTASONE) tablet 60 mg (60 mg Oral Given 03/22/15 0630)    Patient was started on a continuous nebulizer and was given oral prednisone.  Recheck at 6:53 AM patient is in the middle of his continuous nebulizer treatment. He states he's feeling better. On lung exam he is able to take bigger deeper breaths without coughing. I do not hear any more wheezing or rhonchi. He has not had his blood drawn or his chest x-ray done yet.   Labs Review  Pending   Imaging Review  pending  I have personally reviewed and evaluated these images and lab results as part of my medical decision-making.   EKG Interpretation None      MDM   Final diagnoses:  COPD with exacerbation (HCC)    Disposition pending  Devoria Albe, MD,  Concha Pyo, MD 03/22/15 0700

## 2015-03-22 NOTE — ED Provider Notes (Signed)
Pt received at sign out. Continuous neb completed. Lungs diminished bilat, Sats 94% R/A at rest. Pt ambulated with Sats dropping to 88% R/A. Admission recommended. Pt states he "needs to leave" and "will come back." Pt informed re: dx testing results, including hypoxia with ambulation, and that I recommend observation admission for further evaluation.  Pt refuses admission.  I encouraged pt to stay, continues to refuse.  Pt makes his own medical decisions.  Risks of AMA explained to pt, including, but not limited to:  stroke, heart attack, cardiac arrythmia ("irregular heart rate/beat"), "passing out," temporary and/or permanent disability, death.  Pt verb understanding and continues to refuse admission, understanding the consequences of his decision.  I encouraged pt to follow up with his PMD today and return to the ED immediately if symptoms return, he changes his mind, or for any other concerns.  Pt verb understanding, agreeable.   Samuel JesterKathleen Cian Costanzo, DO 03/22/15 [phone]

## 2015-03-22 NOTE — ED Notes (Signed)
Pt made aware to return if symptoms worsen or if any life threatening symptoms occur.   

## 2015-03-23 ENCOUNTER — Encounter (HOSPITAL_COMMUNITY): Payer: Self-pay

## 2015-03-23 ENCOUNTER — Emergency Department (HOSPITAL_COMMUNITY)
Admission: EM | Admit: 2015-03-23 | Discharge: 2015-03-23 | Disposition: A | Discharge status: Home / Self Care | Payer: Medicare Other | Attending: Emergency Medicine | Admitting: Emergency Medicine

## 2015-03-23 ENCOUNTER — Emergency Department (HOSPITAL_COMMUNITY): Payer: Medicare Other

## 2015-03-23 DIAGNOSIS — R0602 Shortness of breath: Secondary | ICD-10-CM | POA: Diagnosis present

## 2015-03-23 DIAGNOSIS — Z9889 Other specified postprocedural states: Secondary | ICD-10-CM | POA: Diagnosis not present

## 2015-03-23 DIAGNOSIS — E785 Hyperlipidemia, unspecified: Secondary | ICD-10-CM | POA: Diagnosis not present

## 2015-03-23 DIAGNOSIS — J441 Chronic obstructive pulmonary disease with (acute) exacerbation: Secondary | ICD-10-CM | POA: Diagnosis not present

## 2015-03-23 DIAGNOSIS — R0902 Hypoxemia: Secondary | ICD-10-CM | POA: Diagnosis not present

## 2015-03-23 DIAGNOSIS — Z79899 Other long term (current) drug therapy: Secondary | ICD-10-CM | POA: Insufficient documentation

## 2015-03-23 DIAGNOSIS — Z87891 Personal history of nicotine dependence: Secondary | ICD-10-CM | POA: Insufficient documentation

## 2015-03-23 MED ORDER — IOHEXOL 300 MG/ML  SOLN: 100.0000 mL | Freq: Once | INTRAMUSCULAR | Status: DC | PRN

## 2015-03-23 MED ORDER — IOHEXOL 350 MG/ML SOLN
100.0000 mL | Freq: Once | INTRAVENOUS | Status: AC | PRN
  Administered 2015-03-23: 100 mL via INTRAVENOUS

## 2015-03-23 MED ORDER — PREDNISONE 20 MG PO TABS: 40.0000 mg | ORAL_TABLET | Freq: Every day | ORAL | Status: DC

## 2015-03-23 MED ORDER — METHYLPREDNISOLONE SODIUM SUCC 125 MG IJ SOLR
125.0000 mg | Freq: Once | INTRAMUSCULAR | Status: AC
  Administered 2015-03-23: 125 mg via INTRAVENOUS
  Filled 2015-03-23: qty 2

## 2015-03-23 MED ORDER — IPRATROPIUM-ALBUTEROL 0.5-2.5 (3) MG/3ML IN SOLN
3.0000 mL | Freq: Once | RESPIRATORY_TRACT | Status: AC
  Administered 2015-03-23: 3 mL via RESPIRATORY_TRACT
  Filled 2015-03-23: qty 3

## 2015-03-23 NOTE — ED Notes (Signed)
Pt reports was here yesterday for SOB and reports was told he needed to be admitted.  Pt says he had to take wife home so he couldn't stay.

## 2015-03-23 NOTE — ED Notes (Signed)
MD at bedside. 

## 2015-03-23 NOTE — Discharge Instructions (Signed)
Chronic Obstructive Pulmonary Disease Chronic obstructive pulmonary disease (COPD) is a common lung condition in which airflow from the lungs is limited. COPD is a general term that can be used to describe many different lung problems that limit airflow, including both chronic bronchitis and emphysema. If you have COPD, your lung function will probably never return to normal, but there are measures you can take to improve lung function and make yourself feel better. CAUSES   Smoking (common).  Exposure to secondhand smoke.  Genetic problems.  Chronic inflammatory lung diseases or recurrent infections. SYMPTOMS  Shortness of breath, especially with physical activity.  Deep, persistent (chronic) cough with a large amount of thick mucus.  Wheezing.  Rapid breaths (tachypnea).  Gray or bluish discoloration (cyanosis) of the skin, especially in your fingers, toes, or lips.  Fatigue.  Weight loss.  Frequent infections or episodes when breathing symptoms become much worse (exacerbations).  Chest tightness. DIAGNOSIS Your health care provider will take a medical history and perform a physical examination to diagnose COPD. Additional tests for COPD may include:  Lung (pulmonary) function tests.  Chest X-ray.  CT scan.  Blood tests. TREATMENT  Treatment for COPD may include:  Inhaler and nebulizer medicines. These help manage the symptoms of COPD and make your breathing more comfortable.  Supplemental oxygen. Supplemental oxygen is only helpful if you have a low oxygen level in your blood.  Exercise and physical activity. These are beneficial for nearly all people with COPD.  Lung surgery or transplant.  Nutrition therapy to gain weight, if you are underweight.  Pulmonary rehabilitation. This may involve working with a team of health care providers and specialists, such as respiratory, occupational, and physical therapists. HOME CARE INSTRUCTIONS  Take all medicines  (inhaled or pills) as directed by your health care provider.  Avoid over-the-counter medicines or cough syrups that dry up your airway (such as antihistamines) and slow down the elimination of secretions unless instructed otherwise by your health care provider.  If you are a smoker, the most important thing that you can do is stop smoking. Continuing to smoke will cause further lung damage and breathing trouble. Ask your health care provider for help with quitting smoking. He or she can direct you to community resources or hospitals that provide support.  Avoid exposure to irritants such as smoke, chemicals, and fumes that aggravate your breathing.  Use oxygen therapy and pulmonary rehabilitation if directed by your health care provider. If you require home oxygen therapy, ask your health care provider whether you should purchase a pulse oximeter to measure your oxygen level at home.  Avoid contact with individuals who have a contagious illness.  Avoid extreme temperature and humidity changes.  Eat healthy foods. Eating smaller, more frequent meals and resting before meals may help you maintain your strength.  Stay active, but balance activity with periods of rest. Exercise and physical activity will help you maintain your ability to do things you want to do.  Preventing infection and hospitalization is very important when you have COPD. Make sure to receive all the vaccines your health care provider recommends, especially the pneumococcal and influenza vaccines. Ask your health care provider whether you need a pneumonia vaccine.  Learn and use relaxation techniques to manage stress.  Learn and use controlled breathing techniques as directed by your health care provider. Controlled breathing techniques include:  Pursed lip breathing. Start by breathing in (inhaling) through your nose for 1 second. Then, purse your lips as if you were   going to whistle and breathe out (exhale) through the  pursed lips for 2 seconds.  Diaphragmatic breathing. Start by putting one hand on your abdomen just above your waist. Inhale slowly through your nose. The hand on your abdomen should move out. Then purse your lips and exhale slowly. You should be able to feel the hand on your abdomen moving in as you exhale.  Learn and use controlled coughing to clear mucus from your lungs. Controlled coughing is a series of short, progressive coughs. The steps of controlled coughing are: 1. Lean your head slightly forward. 2. Breathe in deeply using diaphragmatic breathing. 3. Try to hold your breath for 3 seconds. 4. Keep your mouth slightly open while coughing twice. 5. Spit any mucus out into a tissue. 6. Rest and repeat the steps once or twice as needed. SEEK MEDICAL CARE IF:  You are coughing up more mucus than usual.  There is a change in the color or thickness of your mucus.  Your breathing is more labored than usual.  Your breathing is faster than usual. SEEK IMMEDIATE MEDICAL CARE IF:  You have shortness of breath while you are resting.  You have shortness of breath that prevents you from:  Being able to talk.  Performing your usual physical activities.  You have chest pain lasting longer than 5 minutes.  Your skin color is more cyanotic than usual.  You measure low oxygen saturations for longer than 5 minutes with a pulse oximeter. MAKE SURE YOU:  Understand these instructions.  Will watch your condition.  Will get help right away if you are not doing well or get worse.   This information is not intended to replace advice given to you by your health care provider. Make sure you discuss any questions you have with your health care provider.   Document Released: 01/15/2005 Document Revised: 04/28/2014 Document Reviewed: 12/02/2012 Elsevier Interactive Patient Education 2016 [employer]  

## 2015-03-23 NOTE — ED Provider Notes (Signed)
CSN: [id number]     Arrival date & time 03/23/15  1014 History  By signing my name below, I, Elon SpannerGarrett Cook, attest that this documentation has been prepared under the direction and in the presence of Raeford RazorStephen Yeraldi Fidler, MD. Electronically Signed: Elon SpannerGarrett Cook, ED Scribe. 03/23/2015. 10:44 AM.    Chief Complaint  Patient presents with  . Shortness of Breath   The history is provided by the patient. No language interpreter was used.   HPI Comments: Fabio NeighborsRoy L Pinnix is a 64 y.o. male with hx of COPD who presents to the Emergency Department complaining of constant, unchanged SOB onset 5 days ago.  Associated symptoms include cough productive of yellow phlegm, wheezing.  He has used his albuterol inhaler at home without relief. The patient report he was seen in the ED yesterday and declined admission.  Patient is a former smoker; quite date: 2009.  He denies CP, fever, chills.   Past Medical History  Diagnosis Date  . 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 History of tobacco abuse     80 pack year history   . Dyslipidemia    Past Surgical History  Procedure Laterality Date  . Cardiac catheterization  08/10/2008    right & left - normal coronaries (Dr. Claudia DesanctisH. Solomon)  . Transthoracic echocardiogram  07/2008    EF=>55%; RV mildly dilated; RA mild-mod dilated; mild mitral annular calcification & mild MR; AV mildly sclerotic  . Nm myocar perf wall motion  07/2008    bruce myoview - mild ischemia in basal inferoseptal & mid inferoseptal regions; EF 63%   Family History  Problem Relation Age of Onset  . Leukemia Mother   . Suicidality Father    Social History  Substance Use Topics  . Smoking status: Former Smoker    Quit date: 06/22/2007  . Smokeless tobacco: Never Used  . Alcohol Use: No    Review of Systems 10 Systems reviewed and all are negative for acute change except as noted in the HPI.  Allergies  Ativan  Home Medications   Prior to Admission medications    Medication Sig Start Date End Date Taking? Authorizing Provider  albuterol (PROVENTIL HFA;VENTOLIN HFA) 108 (90 BASE) MCG/ACT inhaler Inhale 2 puffs into the lungs every 6 (six) hours as needed for wheezing or shortness of breath.   Yes Historical Provider, MD  albuterol (PROVENTIL) 2 MG tablet Take 2 mg by mouth 2 (two) times daily.   Yes Historical Provider, MD  diazepam (VALIUM) 10 MG tablet Take 10 mg by mouth every 8 (eight) hours as needed for anxiety.    Yes Historical Provider, MD  omeprazole (PRILOSEC) 40 MG capsule Take 40 mg by mouth 2 (two) times daily as needed (heartburn).  01/10/13  Yes Historical Provider, MD  oxyCODONE-acetaminophen (PERCOCET) 7.5-325 MG per tablet Take 1 tablet by mouth every 4 (four) hours as needed for pain.   Yes Historical Provider, MD  simvastatin (ZOCOR) 20 MG tablet Take 1 tablet (20 mg total) by mouth at bedtime. 06/23/14  Yes Chrystie NoseKenneth C Hilty, MD   BP 161/108 mmHg  Pulse 125  Temp(Src) 98.1 F (36.7 C) (Oral)  Resp 28  Ht 5\' 9"  (1.753 m)  Wt 190 lb (86.183 kg)  BMI 28.05 kg/m2  SpO2 94% Physical Exam  Constitutional: He is oriented to person, place, and time. He appears well-developed and well-nourished.  HENT:  Head: Normocephalic and atraumatic.  Eyes: EOM are normal.  Neck: Normal range  of motion.  Cardiovascular: Normal rate, regular rhythm, normal heart sounds and intact distal pulses.   Pulmonary/Chest: Effort normal and breath sounds normal. No respiratory distress.  Mild tachypnea.  Mild inspiratory wheezing.  Prolonged expiratory phase.  Distant breath sounds but no discernable wheezing on inspiration.    Abdominal: Soft. He exhibits no distension. There is no tenderness.  Musculoskeletal: Normal range of motion.  Neurological: He is alert and oriented to person, place, and time.  Skin: Skin is warm and dry.  Psychiatric: He has a normal mood and affect. Judgment normal.  Nursing note and vitals reviewed.   ED Course  Procedures  (including critical care time)  DIAGNOSTIC STUDIES: Oxygen Saturation is 94% on RA, normal by my interpretation.    COORDINATION OF CARE:  10:45 AM Will order breathing treatment.  Patient acknowledges and agrees with plan.    Labs Review Labs Reviewed - No data to display  Imaging Review Dg Chest 2 View  03/22/2015  CLINICAL DATA:  Productive cough for 4 days. EXAM: CHEST - 2 VIEW COMPARISON:  Two-view chest x-ray 04/29/2010 FINDINGS: The heart size is normal. There is no edema or effusion to suggest failure. An azygos fissure is noted. Emphysematous changes are again noted. No focal airspace disease is present. Degenerative changes are again seen in the thoracic spine. IMPRESSION: 1. Emphysema. 2. No acute cardiopulmonary disease. Electronically Signed   By: Marin Roberts M.D.   On: 03/22/2015 08:21   I have personally reviewed and evaluated these images and lab results as part of my medical decision-making.   EKG Interpretation   Date/Time:  Friday March 23 2015 10:28:19 EST Ventricular Rate:  123 PR Interval:  142 QRS Duration: 114 QT Interval:  331 QTC Calculation: 473 R Axis:   -44 Text Interpretation:  Sinus tachycardia Multiform ventricular premature  complexes Borderline IVCD with LAD Anterior infarct, old ED PHYSICIAN  INTERPRETATION AVAILABLE IN [facility] Confirmed by TEST, Record  ([id number]) on 03/25/2015 8:59:55 AM      MDM   Final diagnoses:  COPD exacerbation (HCC)    64 year male with dyspnea. He was seen the emergency room yesterday for the same. He declined admission at that time. Continued treatment for COPD exacerbation today. He is maintaining his oxygen saturations. He does not have significant increased work of breathing. At this point I don't feel that he requires admission to the hospital. Patient is comfortable with discharge home. Course of steroids. Return precautions were discussed.  I personally preformed the services scribed in my  presence. The recorded information has been reviewed is accurate. Raeford Razor, MD.    Raeford Razor, MD 03/30/15 [phone]

## 2015-04-09 DIAGNOSIS — F419 Anxiety disorder, unspecified: Secondary | ICD-10-CM | POA: Diagnosis not present

## 2015-04-09 DIAGNOSIS — Z1389 Encounter for screening for other disorder: Secondary | ICD-10-CM | POA: Diagnosis not present

## 2015-04-09 DIAGNOSIS — G894 Chronic pain syndrome: Secondary | ICD-10-CM | POA: Diagnosis not present

## 2015-04-09 DIAGNOSIS — J209 Acute bronchitis, unspecified: Secondary | ICD-10-CM | POA: Diagnosis not present

## 2015-04-09 DIAGNOSIS — Z6827 Body mass index (BMI) 27.0-27.9, adult: Secondary | ICD-10-CM | POA: Diagnosis not present

## 2015-04-09 DIAGNOSIS — J449 Chronic obstructive pulmonary disease, unspecified: Secondary | ICD-10-CM | POA: Diagnosis not present

## 2015-04-21 ENCOUNTER — Other Ambulatory Visit: Payer: Self-pay | Admitting: Internal Medicine

## 2015-04-24 NOTE — Telephone Encounter (Signed)
Rx request sent to pharmacy.  

## 2015-07-03 ENCOUNTER — Encounter (HOSPITAL_COMMUNITY): Payer: Self-pay | Admitting: Emergency Medicine

## 2015-07-03 ENCOUNTER — Emergency Department (HOSPITAL_COMMUNITY): Payer: Medicare Other

## 2015-07-03 ENCOUNTER — Inpatient Hospital Stay (HOSPITAL_COMMUNITY)
Admission: EM | Admit: 2015-07-03 | Discharge: 2015-07-06 | DRG: 871 | Disposition: A | Discharge status: Home / Self Care | Payer: Medicare Other | Attending: Family Medicine | Admitting: Family Medicine

## 2015-07-03 DIAGNOSIS — K219 Gastro-esophageal reflux disease without esophagitis: Secondary | ICD-10-CM | POA: Diagnosis present

## 2015-07-03 DIAGNOSIS — J438 Other emphysema: Secondary | ICD-10-CM | POA: Diagnosis not present

## 2015-07-03 DIAGNOSIS — A419 Sepsis, unspecified organism: Secondary | ICD-10-CM | POA: Diagnosis not present

## 2015-07-03 DIAGNOSIS — Z79891 Long term (current) use of opiate analgesic: Secondary | ICD-10-CM | POA: Diagnosis not present

## 2015-07-03 DIAGNOSIS — Z87891 Personal history of nicotine dependence: Secondary | ICD-10-CM | POA: Diagnosis not present

## 2015-07-03 DIAGNOSIS — F419 Anxiety disorder, unspecified: Secondary | ICD-10-CM | POA: Diagnosis present

## 2015-07-03 DIAGNOSIS — J9621 Acute and chronic respiratory failure with hypoxia: Secondary | ICD-10-CM | POA: Diagnosis present

## 2015-07-03 DIAGNOSIS — R531 Weakness: Secondary | ICD-10-CM | POA: Diagnosis not present

## 2015-07-03 DIAGNOSIS — E785 Hyperlipidemia, unspecified: Secondary | ICD-10-CM | POA: Diagnosis not present

## 2015-07-03 DIAGNOSIS — Z23 Encounter for immunization: Secondary | ICD-10-CM

## 2015-07-03 DIAGNOSIS — R509 Fever, unspecified: Secondary | ICD-10-CM | POA: Diagnosis not present

## 2015-07-03 DIAGNOSIS — J440 Chronic obstructive pulmonary disease with acute lower respiratory infection: Secondary | ICD-10-CM | POA: Diagnosis present

## 2015-07-03 DIAGNOSIS — I2510 Atherosclerotic heart disease of native coronary artery without angina pectoris: Secondary | ICD-10-CM | POA: Diagnosis present

## 2015-07-03 DIAGNOSIS — J449 Chronic obstructive pulmonary disease, unspecified: Secondary | ICD-10-CM | POA: Diagnosis present

## 2015-07-03 DIAGNOSIS — J9601 Acute respiratory failure with hypoxia: Secondary | ICD-10-CM | POA: Diagnosis not present

## 2015-07-03 DIAGNOSIS — Z806 Family history of leukemia: Secondary | ICD-10-CM | POA: Diagnosis not present

## 2015-07-03 DIAGNOSIS — J189 Pneumonia, unspecified organism: Secondary | ICD-10-CM | POA: Diagnosis not present

## 2015-07-03 DIAGNOSIS — R000 Tachycardia, unspecified: Secondary | ICD-10-CM | POA: Diagnosis not present

## 2015-07-03 DIAGNOSIS — J441 Chronic obstructive pulmonary disease with (acute) exacerbation: Secondary | ICD-10-CM | POA: Diagnosis not present

## 2015-07-03 DIAGNOSIS — R110 Nausea: Secondary | ICD-10-CM | POA: Diagnosis not present

## 2015-07-03 DIAGNOSIS — Z7982 Long term (current) use of aspirin: Secondary | ICD-10-CM

## 2015-07-03 DIAGNOSIS — R05 Cough: Secondary | ICD-10-CM | POA: Diagnosis not present

## 2015-07-03 DIAGNOSIS — R0602 Shortness of breath: Secondary | ICD-10-CM | POA: Diagnosis not present

## 2015-07-03 DIAGNOSIS — R404 Transient alteration of awareness: Secondary | ICD-10-CM | POA: Diagnosis not present

## 2015-07-03 DIAGNOSIS — J439 Emphysema, unspecified: Secondary | ICD-10-CM | POA: Diagnosis not present

## 2015-07-03 LAB — COMPREHENSIVE METABOLIC PANEL
ALK PHOS: 84 U/L (ref 38–126)
ALT: 32 U/L (ref 17–63)
AST: 27 U/L (ref 15–41)
Albumin: 4.2 g/dL (ref 3.5–5.0)
Anion gap: 9 (ref 5–15)
BUN: 14 mg/dL (ref 6–20)
CALCIUM: 8.9 mg/dL (ref 8.9–10.3)
CHLORIDE: 105 mmol/L (ref 101–111)
CO2: 23 mmol/L (ref 22–32)
CREATININE: 1.22 mg/dL (ref 0.61–1.24)
Glucose, Bld: 115 mg/dL — ABNORMAL HIGH (ref 65–99)
Potassium: 4.4 mmol/L (ref 3.5–5.1)
Sodium: 137 mmol/L (ref 135–145)
Total Bilirubin: 1 mg/dL (ref 0.3–1.2)
Total Protein: 7.1 g/dL (ref 6.5–8.1)

## 2015-07-03 LAB — INFLUENZA PANEL BY PCR (TYPE A & B)
H1N1FLUPCR: NOT DETECTED
INFLBPCR: NEGATIVE
Influenza A By PCR: NEGATIVE

## 2015-07-03 LAB — EXPECTORATED SPUTUM ASSESSMENT W REFEX TO RESP CULTURE

## 2015-07-03 LAB — GLUCOSE, CAPILLARY: GLUCOSE-CAPILLARY: 175 mg/dL — AB (ref 65–99)

## 2015-07-03 LAB — URINALYSIS, ROUTINE W REFLEX MICROSCOPIC
BILIRUBIN URINE: NEGATIVE
GLUCOSE, UA: NEGATIVE mg/dL
HGB URINE DIPSTICK: NEGATIVE
Ketones, ur: NEGATIVE mg/dL
Leukocytes, UA: NEGATIVE
Nitrite: NEGATIVE
Protein, ur: NEGATIVE mg/dL
pH: 5.5 (ref 5.0–8.0)

## 2015-07-03 LAB — CBC WITH DIFFERENTIAL/PLATELET
BASOS PCT: 0 %
Basophils Absolute: 0 10*3/uL (ref 0.0–0.1)
EOS ABS: 0 10*3/uL (ref 0.0–0.7)
Eosinophils Relative: 0 %
HCT: 48.5 % (ref 39.0–52.0)
HEMOGLOBIN: 16.7 g/dL (ref 13.0–17.0)
Lymphocytes Relative: 4 %
Lymphs Abs: 0.5 10*3/uL — ABNORMAL LOW (ref 0.7–4.0)
MCH: 30.9 pg (ref 26.0–34.0)
MCHC: 34.4 g/dL (ref 30.0–36.0)
MCV: 89.6 fL (ref 78.0–100.0)
Monocytes Absolute: 1.2 10*3/uL — ABNORMAL HIGH (ref 0.1–1.0)
Monocytes Relative: 8 %
NEUTROS PCT: 88 %
Neutro Abs: 13.9 10*3/uL — ABNORMAL HIGH (ref 1.7–7.7)
Platelets: 268 10*3/uL (ref 150–400)
RBC: 5.41 MIL/uL (ref 4.22–5.81)
RDW: 12.9 % (ref 11.5–15.5)
WBC: 15.6 10*3/uL — AB (ref 4.0–10.5)

## 2015-07-03 LAB — EXPECTORATED SPUTUM ASSESSMENT W GRAM STAIN, RFLX TO RESP C

## 2015-07-03 LAB — MRSA PCR SCREENING: MRSA by PCR: NEGATIVE

## 2015-07-03 LAB — BRAIN NATRIURETIC PEPTIDE: B Natriuretic Peptide: 20 pg/mL (ref 0.0–100.0)

## 2015-07-03 LAB — I-STAT CG4 LACTIC ACID, ED: Lactic Acid, Venous: 2.6 mmol/L (ref 0.5–2.0)

## 2015-07-03 LAB — TROPONIN I: Troponin I: <0.03 ng/mL (ref <0.031)

## 2015-07-03 LAB — LACTIC ACID, PLASMA: Lactic Acid, Venous: 1.9 mmol/L (ref 0.5–2.0)

## 2015-07-03 MED ORDER — PANTOPRAZOLE SODIUM 40 MG PO TBEC
40.0000 mg | DELAYED_RELEASE_TABLET | Freq: Two times a day (BID) | ORAL | Status: DC
  Administered 2015-07-03 – 2015-07-06 (×6): 40 mg via ORAL
  Filled 2015-07-03 (×6): qty 1

## 2015-07-03 MED ORDER — PIPERACILLIN-TAZOBACTAM 3.375 G IVPB 30 MIN
3.3750 g | Freq: Once | INTRAVENOUS | Status: AC
  Administered 2015-07-03: 3.375 g via INTRAVENOUS
  Filled 2015-07-03: qty 50

## 2015-07-03 MED ORDER — MAGNESIUM OXIDE 400 (241.3 MG) MG PO TABS
400.0000 mg | ORAL_TABLET | Freq: Every day | ORAL | Status: DC
  Administered 2015-07-03 – 2015-07-06 (×4): 400 mg via ORAL
  Filled 2015-07-03 (×4): qty 1

## 2015-07-03 MED ORDER — ENOXAPARIN SODIUM 40 MG/0.4ML ~~LOC~~ SOLN
40.0000 mg | SUBCUTANEOUS | Status: DC
  Administered 2015-07-03 – 2015-07-06 (×4): 40 mg via SUBCUTANEOUS
  Filled 2015-07-03 (×4): qty 0.4

## 2015-07-03 MED ORDER — SODIUM CHLORIDE 0.9 % IV SOLN: INTRAVENOUS | Status: DC

## 2015-07-03 MED ORDER — OXYCODONE-ACETAMINOPHEN 7.5-325 MG PO TABS
1.0000 | ORAL_TABLET | ORAL | Status: DC | PRN
  Administered 2015-07-03 – 2015-07-05 (×2): 1 via ORAL
  Filled 2015-07-03 (×3): qty 1

## 2015-07-03 MED ORDER — VANCOMYCIN HCL IN DEXTROSE 1-5 GM/200ML-% IV SOLN
1000.0000 mg | Freq: Once | INTRAVENOUS | Status: AC
  Administered 2015-07-03: 1000 mg via INTRAVENOUS
  Filled 2015-07-03: qty 200

## 2015-07-03 MED ORDER — SODIUM CHLORIDE 0.9 % IV SOLN: 1000.0000 mL | INTRAVENOUS | Status: DC

## 2015-07-03 MED ORDER — ASPIRIN EC 81 MG PO TBEC
81.0000 mg | DELAYED_RELEASE_TABLET | Freq: Every day | ORAL | Status: DC
  Administered 2015-07-03 – 2015-07-06 (×4): 81 mg via ORAL
  Filled 2015-07-03 (×4): qty 1

## 2015-07-03 MED ORDER — DEXTROSE 5 % IV SOLN
500.0000 mg | INTRAVENOUS | Status: DC
  Administered 2015-07-03 – 2015-07-05 (×3): 500 mg via INTRAVENOUS
  Filled 2015-07-03 (×4): qty 500

## 2015-07-03 MED ORDER — SODIUM CHLORIDE 0.9 % IV BOLUS (SEPSIS)
1000.0000 mL | Freq: Once | INTRAVENOUS | Status: AC
  Administered 2015-07-03: 1000 mL via INTRAVENOUS

## 2015-07-03 MED ORDER — POLYVINYL ALCOHOL 1.4 % OP SOLN
1.0000 [drp] | OPHTHALMIC | Status: DC | PRN
  Filled 2015-07-03: qty 15

## 2015-07-03 MED ORDER — SODIUM CHLORIDE 0.9 % IV SOLN
INTRAVENOUS | Status: DC
  Administered 2015-07-03: 12:00:00 via INTRAVENOUS

## 2015-07-03 MED ORDER — IPRATROPIUM-ALBUTEROL 0.5-2.5 (3) MG/3ML IN SOLN
3.0000 mL | Freq: Four times a day (QID) | RESPIRATORY_TRACT | Status: DC
  Administered 2015-07-03 – 2015-07-05 (×6): 3 mL via RESPIRATORY_TRACT
  Filled 2015-07-03 (×6): qty 3

## 2015-07-03 MED ORDER — PROPYLENE GLYCOL 0.6 % OP SOLN: 1.0000 [drp] | Freq: Two times a day (BID) | OPHTHALMIC | Status: DC | PRN

## 2015-07-03 MED ORDER — SIMVASTATIN 20 MG PO TABS
20.0000 mg | ORAL_TABLET | Freq: Every day | ORAL | Status: DC
  Administered 2015-07-03 – 2015-07-05 (×3): 20 mg via ORAL
  Filled 2015-07-03 (×3): qty 1

## 2015-07-03 MED ORDER — GUAIFENESIN ER 600 MG PO TB12
1200.0000 mg | ORAL_TABLET | Freq: Two times a day (BID) | ORAL | Status: DC
  Administered 2015-07-03 – 2015-07-06 (×7): 1200 mg via ORAL
  Filled 2015-07-03 (×7): qty 2

## 2015-07-03 MED ORDER — PNEUMOCOCCAL VAC POLYVALENT 25 MCG/0.5ML IJ INJ
0.5000 mL | INJECTION | INTRAMUSCULAR | Status: AC
  Administered 2015-07-04: 0.5 mL via INTRAMUSCULAR
  Filled 2015-07-03: qty 0.5

## 2015-07-03 MED ORDER — CETYLPYRIDINIUM CHLORIDE 0.05 % MT LIQD
7.0000 mL | Freq: Two times a day (BID) | OROMUCOSAL | Status: DC
  Administered 2015-07-03 – 2015-07-05 (×3): 7 mL via OROMUCOSAL

## 2015-07-03 MED ORDER — ACETAMINOPHEN 500 MG PO TABS
1000.0000 mg | ORAL_TABLET | Freq: Once | ORAL | Status: AC
  Administered 2015-07-03: 1000 mg via ORAL
  Filled 2015-07-03: qty 2

## 2015-07-03 MED ORDER — DEXTROSE 5 % IV SOLN
1.0000 g | INTRAVENOUS | Status: DC
  Administered 2015-07-03 – 2015-07-05 (×3): 1 g via INTRAVENOUS
  Filled 2015-07-03 (×4): qty 10

## 2015-07-03 MED ORDER — DIAZEPAM 5 MG PO TABS
10.0000 mg | ORAL_TABLET | Freq: Three times a day (TID) | ORAL | Status: DC
  Administered 2015-07-03 – 2015-07-06 (×10): 10 mg via ORAL
  Filled 2015-07-03 (×11): qty 2

## 2015-07-03 MED ORDER — IBUPROFEN 800 MG PO TABS
800.0000 mg | ORAL_TABLET | Freq: Once | ORAL | Status: AC
  Administered 2015-07-03: 800 mg via ORAL
  Filled 2015-07-03: qty 1

## 2015-07-03 MED ORDER — SODIUM CHLORIDE 0.9 % IV BOLUS (SEPSIS)
1000.0000 mL | INTRAVENOUS | Status: AC
  Administered 2015-07-03 (×2): 1000 mL via INTRAVENOUS

## 2015-07-03 NOTE — ED Notes (Signed)
Attempted report x1. 

## 2015-07-03 NOTE — Progress Notes (Signed)
Pt arrived to unit rm# [id number].  [facility].

## 2015-07-03 NOTE — Progress Notes (Signed)
Report called to Bear StearnsJason RN. Patient going to room 304.

## 2015-07-03 NOTE — ED Notes (Signed)
Asked pt for urine x3.

## 2015-07-03 NOTE — ED Notes (Signed)
Pt c/o cough, fever and weakness that started last night.

## 2015-07-03 NOTE — Progress Notes (Signed)
Pharmacy:  Renal monitoring for antibiotics ordered.  Currently receiving Rocephin and Zithromax which do not require any adjustment or pharmacokinetic monitoring.  Sign off. Mady GemmaHayes, Kaliopi Blyden R, [facility] Asc[facility]PH 07/03/2015 10:59 AM

## 2015-07-03 NOTE — H&P (Signed)
[facility] History and Physical  [name] ZOX:[id number] DOB: November 16, 1950 DOA: 07/03/2015  Referring physician: Erick Blinks PCP: Cassell Smiles., MD    Chief Complaint: Fever, cough, generalized weakness  HPI: [name] is a 65 y.o. male with a hx of COPD, CAD, and anxiety, who presents with complaints of fever and cough onset last night. He reports that has has had an influenza vaccination in September of last year. He has COPD and has nebs at home. He reports that he was in his usual state of health yesterday when he went to bed. He woke up at 10:30 with complaints of SOB, cough, and fever. His SOB continued to persist and progress. Results ultimately resulted in his ER visit. He denies any associated chest pain or discomfort, V/D, diaphoresis, or rash. He does not require any home O2. He no longer smokes.    In ED, he was found to be febrile and tachycardic and tachypnic. Tachypnea required supplemental O2. Workup revealed WBC 15.6, lactic acid 2.6, and a CXR revealed multilobar right lung PNA. Hospitalist was asked to admit for tx of sepsis and CAP.   Review of Systems:  Constitutional:  No weight loss, night sweats, chills, fatigue. Positive for fever and generalized weakness. HEENT:  No headaches, Difficulty swallowing,Tooth/dental problems,Sore throat,  No sneezing, itching, ear ache, nasal congestion, post nasal drip,  Cardio-vascular:  No chest pain, Orthopnea, PND, swelling in lower extremities, anasarca, dizziness, palpitations  GI:  No heartburn, indigestion, abdominal pain, nausea, vomiting, diarrhea, change in bowel habits, loss of appetite  Resp:  No excess mucus, no productive cough, No non-productive cough, No coughing up of blood.No change in color of mucus.No wheezing.No chest wall deformity. Positive for SOB  Skin:  no rash or lesions.  GU:  no dysuria, change in color of urine, no urgency or frequency. No flank pain.  Musculoskeletal:  No  joint pain or swelling. No decreased range of motion. No back pain.  Psych:  No change in mood or affect. No depression or anxiety. No memory loss.   Past Medical History  Diagnosis Date  . 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 History of tobacco abuse     80 pack year history   . Dyslipidemia    Past Surgical History  Procedure Laterality Date  . Cardiac catheterization  08/10/2008    right & left - normal coronaries (Dr. Claudia Desanctis)  . Transthoracic echocardiogram  07/2008    EF=>55%; RV mildly dilated; RA mild-mod dilated; mild mitral annular calcification & mild MR; AV mildly sclerotic  . Nm myocar perf wall motion  07/2008    bruce myoview - mild ischemia in basal inferoseptal & mid inferoseptal regions; EF 63%   Social History:  reports that he quit smoking about 8 years ago. He has never used smokeless tobacco. He reports that he does not drink alcohol or use illicit drugs.  Allergies  Allergen Reactions  . Ativan [Lorazepam] Other (See Comments)    Dry mouth sinus and breathing problems     Family History  Problem Relation Age of Onset  . Leukemia Mother   . Suicidality Father     do not leave blank  Prior to Admission medications   Medication Sig Start Date End Date Taking? Authorizing Provider  albuterol (PROVENTIL HFA;VENTOLIN HFA) 108 (90 BASE) MCG/ACT inhaler Inhale 2 puffs into the lungs every 6 (six) hours as needed for wheezing or shortness of breath.  Yes Historical Provider, MD  albuterol (PROVENTIL) 2 MG tablet Take 2 mg by mouth 2 (two) times daily.   Yes Historical Provider, MD  Aromatic Inhalants (VICKS VAPOR INHALER IN) Place 1 puff into both nostrils daily as needed (congestion).   Yes Historical Provider, MD  aspirin EC 81 MG tablet Take 81 mg by mouth daily.   Yes Historical Provider, MD  diazepam (VALIUM) 10 MG tablet Take 10 mg by mouth 3 (three) times daily.    Yes Historical Provider, MD  magnesium oxide (MAG-OX) 400  MG tablet Take 400 mg by mouth daily.   Yes Historical Provider, MD  omeprazole (PRILOSEC) 40 MG capsule Take 40 mg by mouth 2 (two) times daily as needed (heartburn).  01/10/13  Yes Historical Provider, MD  oxyCODONE-acetaminophen (PERCOCET) 7.5-325 MG per tablet Take 1 tablet by mouth every 4 (four) hours as needed for pain.   Yes Historical Provider, MD  Propylene Glycol (SYSTANE BALANCE) 0.6 % SOLN Place 1 drop into both eyes 2 (two) times daily as needed (dry eyes).   Yes Historical Provider, MD  simvastatin (ZOCOR) 20 MG tablet TAKE 1 TABLET BY MOUTH DAILY AT BEDTIME. 04/24/15  Yes Chrystie NoseKenneth C Hilty, MD  predniSONE (DELTASONE) 20 MG tablet Take 2 tablets (40 mg total) by mouth daily. Patient not taking: Reported on 07/03/2015 03/23/15   Raeford RazorStephen Kohut, MD   Physical Exam: Filed Vitals:   07/03/15 0845 07/03/15 0900 07/03/15 0925 07/03/15 1000  BP:  112/84  127/82  Pulse: 109 110  108  Temp:      TempSrc:      Resp: 25 21  14   Height:   5\' 9"  (1.753 m)   Weight:   91.4 kg (201 lb 8 oz)   SpO2: 96% 97%  97%    Wt Readings from Last 3 Encounters:  07/03/15 91.4 kg (201 lb 8 oz)  03/23/15 86.183 kg (190 lb)  03/22/15 86.183 kg (190 lb)    General:  Appears calm and comfortable Eyes: PERRL, normal lids, irises & conjunctiva ENT: grossly normal hearing, lips & tongue Neck: no LAD, masses or thyromegaly Cardiovascular: RRR, no m/r/g. No LE edema. Telemetry: SR, no arrhythmias  Respiratory: faint wheeze bilaterally Abdomen: soft, ntnd Skin: no rash or induration seen on limited exam Musculoskeletal: grossly normal tone BUE/BLE Psychiatric: grossly normal mood and affect, speech fluent and appropriate Neurologic: grossly non-focal.          Labs on Admission:  Basic Metabolic Panel:  Recent Labs Lab 07/03/15 0618  NA 137  K 4.4  CL 105  CO2 23  GLUCOSE 115*  BUN 14  CREATININE 1.22  CALCIUM 8.9   Liver Function Tests:  Recent Labs Lab 07/03/15 0618  AST 27  ALT 32    ALKPHOS 84  BILITOT 1.0  PROT 7.1  ALBUMIN 4.2   No results for input(s): LIPASE, AMYLASE in the last 168 hours. No results for input(s): AMMONIA in the last 168 hours. CBC:  Recent Labs Lab 07/03/15 0618  WBC 15.6*  NEUTROABS 13.9*  HGB 16.7  HCT 48.5  MCV 89.6  PLT 268   Cardiac Enzymes:  Recent Labs Lab 07/03/15 0618  TROPONINI <0.03    BNP (last 3 results)  Recent Labs  07/03/15 0618  BNP 20.0    ProBNP (last 3 results) No results for input(s): PROBNP in the last 8760 hours.  CBG: No results for input(s): GLUCAP in the last 168 hours.  Radiological Exams on Admission: Dg  Chest Port 1 View  07/03/2015  CLINICAL DATA:  Cough, fever and shortness of Breath. No time course given. EXAM: PORTABLE CHEST 1 VIEW COMPARISON:  Chest x-ray 03/22/2015. FINDINGS: The cardiac silhouette, mediastinal and hilar contours are within normal limits and stable. There is mild tortuosity of the thoracic aorta. Right lung airspace process most likely pneumonia. No pleural effusion. The left lung is clear. IMPRESSION: Multilobar right lung pneumonia. Electronically Signed   By: Rudie Meyer M.D.   On: 07/03/2015 07:10    EKG: Independently reviewed. Sinus tachycardia  Assessment/Plan Principal Problem:   CAP (community acquired pneumonia) Active Problems:   Dyslipidemia   COPD (chronic obstructive pulmonary disease) (HCC)   Anxiety   Sepsis (HCC)   Acute respiratory failure with hypoxia (HCC)   GERD (gastroesophageal reflux disease)   1. Sepsis, secondary to PNA. WBC 15.6 and lactic acid 2.6. Febrile and tachycardic. Blood cx results pending. Influenza tests negative. He has received 2L IV fluids in ER, and will be continued on maintenance fluids. Start on abx. Tachycardia has improved with resolution of fever and IV fluids.  2. CAP. CXR revealed multilobar right lung PNA. WBC 15.6 and lactic acid 2.6. Start on abx and pulmonary hygiene. Check urinary antigens. 3. Acute  respiratory failure. Continue on [place] and wean O2 as tolerated. 4. COPD. Stable. Continue on bronchodilators and pulmonary hygiene. 5. Anxiety. Continue current meds 6. HLD. Continue statins 7. GERD. Continue PPI  Code Status: Full  DVT Prophylaxis: Lovenox Family Communication: no family at bedside Disposition Plan: anticipate discharge once improved  Time spent: 55 minutes  Erick Blinks, MD. [facility] Pager [phone]   By signing my name below, I, Adron Bene, attest that this documentation has been prepared under the direction and in the presence of Erick Blinks, MD. Electronically Signed: Adron Bene 07/03/2015  10:30am  I, Dr. Erick Blinks, personally performed the services described in this documentaiton. All medical record entries made by the scribe were at my direction and in my presence. I have reviewed the chart and agree that the record reflects my personal performance and is accurate and complete  Erick Blinks, MD, 07/03/2015 10:52 AM

## 2015-07-03 NOTE — ED Provider Notes (Signed)
TIME SEEN: 6:10 AM  CHIEF COMPLAINT: Fever, cough, generalized weakness  HPI: Pt is a 65 y.o. male with history of COPD who presents to the emergency department with complaints of fever and cough that started last night. Denies chest pain or chest discomfort. No vomiting or diarrhea. No rash. Did have an influenza vaccination in September last year. No known sick contacts or recent travel.  States he does not wear oxygen at home.  PCP [name]  ROS: See HPI Constitutional:  fever  Eyes: no drainage  ENT: no runny nose   Cardiovascular:  no chest pain  Resp:  SOB  GI: no vomiting GU: no dysuria Integumentary: no rash  Allergy: no hives  Musculoskeletal: no leg swelling  Neurological: no slurred speech ROS otherwise negative  PAST MEDICAL HISTORY/PAST SURGICAL HISTORY:  Past Medical History  Diagnosis Date  . 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 History of tobacco abuse     80 pack year history   . Dyslipidemia     MEDICATIONS:  Prior to Admission medications   Medication Sig Start Date End Date Taking? Authorizing Provider  albuterol (PROVENTIL HFA;VENTOLIN HFA) 108 (90 BASE) MCG/ACT inhaler Inhale 2 puffs into the lungs every 6 (six) hours as needed for wheezing or shortness of breath.   Yes Historical Provider, MD  albuterol (PROVENTIL) 2 MG tablet Take 2 mg by mouth 2 (two) times daily.   Yes Historical Provider, MD  diazepam (VALIUM) 10 MG tablet Take 10 mg by mouth every 8 (eight) hours as needed for anxiety.    Yes Historical Provider, MD  omeprazole (PRILOSEC) 40 MG capsule Take 40 mg by mouth 2 (two) times daily as needed (heartburn).  01/10/13  Yes Historical Provider, MD  oxyCODONE-acetaminophen (PERCOCET) 7.5-325 MG per tablet Take 1 tablet by mouth every 4 (four) hours as needed for pain.   Yes Historical Provider, MD  simvastatin (ZOCOR) 20 MG tablet TAKE 1 TABLET BY MOUTH DAILY AT BEDTIME. 04/24/15  Yes Chrystie NoseKenneth C Hilty, MD  predniSONE  (DELTASONE) 20 MG tablet Take 2 tablets (40 mg total) by mouth daily. 03/23/15   Raeford RazorStephen Kohut, MD    ALLERGIES:  Allergies  Allergen Reactions  . Ativan [Lorazepam] Other (See Comments)    Dry mouth sinus and breathing problems     SOCIAL HISTORY:  Social History  Substance Use Topics  . Smoking status: Former Smoker    Quit date: 06/22/2007  . Smokeless tobacco: Never Used  . Alcohol Use: No    FAMILY HISTORY: Family History  Problem Relation Age of Onset  . Leukemia Mother   . Suicidality Father     EXAM: BP 130/94 mmHg  Pulse 161  Temp(Src) 103.3 F (39.6 C) (Rectal)  Resp 21  Ht 5\' 9"  (1.753 m)  Wt 195 lb (88.451 kg)  BMI 28.78 kg/m2  SpO2 92% CONSTITUTIONAL: Alert and oriented and responds appropriately to questions. Chronically ill-appearing, febrile, nontoxic HEAD: Normocephalic EYES: Conjunctivae clear, PERRL ENT: normal nose; no rhinorrhea; moist mucous membranes; No pharyngeal erythema or petechiae, no tonsillar hypertrophy or exudate, no uvular deviation, no trismus or drooling, normal phonation, no stridor, no dental caries or abscess noted, no Ludwig's angina, tongue sits flat in the bottom of the mouth; patient has yellow mucus in his mouth NECK: Supple, no meningismus, no LAD  CARD: Regular and tachycardic; S1 and S2 appreciated; no murmurs, no clicks, no rubs, no gallops RESP: Normal chest excursion without splinting or tachypnea;  breath sounds clear and equal bilaterally; no wheezes, no rhonchi, no rales, no hypoxia or respiratory distress, speaking full sentences ABD/GI: Normal bowel sounds; non-distended; soft, non-tender, no rebound, no guarding, no peritoneal signs BACK:  The back appears normal and is non-tender to palpation, there is no CVA tenderness EXT: Normal ROM in all joints; non-tender to palpation; no edema; normal capillary refill; no cyanosis, no calf tenderness or swelling    SKIN: Normal color for age and race; warm; no rash NEURO:  Moves all extremities equally, sensation to light touch intact diffusely, cranial nerves II through XII intact PSYCH: The patient's mood and manner are appropriate. Grooming and personal hygiene are appropriate.  MEDICAL DECISION MAKING: Patient here with fever, tachycardia. Code sepsis initiated. We'll give broad-spectrum antibiotics. Concern for possible flu versus pneumonia. I feel he will need admission. PCP is Dr. Sherwood Gambler. Will obtain labs, cultures, chest x-ray, flu swab, urine. Will give IV fluids.  ED PROGRESS: 7:15 AM  Pt's labs show leukocytosis of 15.6 with left shift. Lactate elevated at 2.6. Chest x-ray shows multilobar right lung pneumonia. He is normotensive. Heart rate is improving after antipyretics and IV fluids. Will discuss with hospitalist for admission.  8:20 AM  Pt's heart rate is improving as his temperature is coming down. Blood pressure is still stable. He has received 2 L of IV fluids. We'll give a third liter bolus. He has received broad-spectrum antibiotics. Discussed patient's case with hospitalist, Dr. Kerry Hough.  Recommend admission to step down, inpatient bed.  I will place holding orders per their request. Patient and family (if present) updated with plan. Care transferred to hospitalist service.     EKG Interpretation  Date/Time:  Tuesday July 03 2015 06:07:40 EDT Ventricular Rate:  154 PR Interval:  152 QRS Duration: 97 QT Interval:  255 QTC Calculation: 408 R Axis:   -93 Text Interpretation:  Sinus tachycardia Left anterior fascicular block Anterior infarct, old No significant change since last tracing other than rate is faster Confirmed by WARD,  DO, KRISTEN ([id number]) on 07/03/2015 6:13:14 AM        CRITICAL CARE Performed by: Raelyn Number   Total critical care time: 40 minutes  Critical care time was exclusive of separately billable procedures and treating other patients.  Critical care was necessary to treat or prevent imminent or life-threatening  deterioration.  Critical care was time spent personally by me on the following activities: development of treatment plan with patient and/or surrogate as well as nursing, discussions with consultants, evaluation of patient's response to treatment, examination of patient, obtaining history from patient or surrogate, ordering and performing treatments and interventions, ordering and review of laboratory studies, ordering and review of radiographic studies, pulse oximetry and re-evaluation of patient's condition.   Layla Maw Ward, DO 07/03/15 [phone]

## 2015-07-04 DIAGNOSIS — K219 Gastro-esophageal reflux disease without esophagitis: Secondary | ICD-10-CM

## 2015-07-04 DIAGNOSIS — A419 Sepsis, unspecified organism: Secondary | ICD-10-CM | POA: Diagnosis not present

## 2015-07-04 LAB — CBC
HCT: 42.5 % (ref 39.0–52.0)
Hemoglobin: 14.1 g/dL (ref 13.0–17.0)
MCH: 30.5 pg (ref 26.0–34.0)
MCHC: 33.2 g/dL (ref 30.0–36.0)
MCV: 91.8 fL (ref 78.0–100.0)
PLATELETS: 231 10*3/uL (ref 150–400)
RBC: 4.63 MIL/uL (ref 4.22–5.81)
RDW: 13.3 % (ref 11.5–15.5)
WBC: 18.4 10*3/uL — ABNORMAL HIGH (ref 4.0–10.5)

## 2015-07-04 LAB — COMPREHENSIVE METABOLIC PANEL
ALT: 21 U/L (ref 17–63)
ANION GAP: 7 (ref 5–15)
AST: 16 U/L (ref 15–41)
Albumin: 3.6 g/dL (ref 3.5–5.0)
Alkaline Phosphatase: 67 U/L (ref 38–126)
BUN: 12 mg/dL (ref 6–20)
CHLORIDE: 103 mmol/L (ref 101–111)
CO2: 26 mmol/L (ref 22–32)
Calcium: 8.5 mg/dL — ABNORMAL LOW (ref 8.9–10.3)
Creatinine, Ser: 1.05 mg/dL (ref 0.61–1.24)
Glucose, Bld: 93 mg/dL (ref 65–99)
Potassium: 3.9 mmol/L (ref 3.5–5.1)
Sodium: 136 mmol/L (ref 135–145)
Total Bilirubin: 1.1 mg/dL (ref 0.3–1.2)
Total Protein: 6.2 g/dL — ABNORMAL LOW (ref 6.5–8.1)

## 2015-07-04 LAB — URINE CULTURE: Culture: NO GROWTH

## 2015-07-04 LAB — HIV ANTIBODY (ROUTINE TESTING W REFLEX): HIV Screen 4th Generation wRfx: NONREACTIVE

## 2015-07-04 LAB — STREP PNEUMONIAE URINARY ANTIGEN: Strep Pneumo Urinary Antigen: NEGATIVE

## 2015-07-04 MED ORDER — SODIUM CHLORIDE 0.9 % IV SOLN
INTRAVENOUS | Status: DC
  Administered 2015-07-04 – 2015-07-05 (×2): via INTRAVENOUS

## 2015-07-04 NOTE — Plan of Care (Signed)
End of shift. Reported off to Megan Bullins, RN. 

## 2015-07-04 NOTE — Plan of Care (Signed)
Received report from night nurse. Assumed care of pt. [name], [place].

## 2015-07-04 NOTE — Care Management Note (Signed)
Case Management Note  Patient Details  Name: [name] MRN: [id number] Date of Birth: 1950/12/14  Subjective/Objective:                  Pt admitted with CAP. Pt is from home, lives with wife and is ind with ADL's. Pt has PCP, transportation and no problems obtaining medications. Pt has no home O2, HH or other DME prior to admission. Pt says he uses inhalers. He used to have  A neb machine and is unsure if he still has one or not. Pt plans to return home with self care at DC.   Action/Plan: No CM needs anticipated. Will cont to follow.   Expected Discharge Date:      07/06/2015            Expected Discharge Plan:  Home/Self Care  In-House Referral:  NA  Discharge planning Services  CM Consult  Post Acute Care Choice:  NA Choice offered to:  NA  DME Arranged:    DME Agency:     HH Arranged:    HH Agency:     Status of Service:  In process, will continue to follow  Medicare Important Message Given:    Date Medicare IM Given:    Medicare IM give by:    Date Additional Medicare IM Given:    Additional Medicare Important Message give by:     If discussed at Long Length of Stay Meetings, dates discussed:    Additional Comments:  [name], Edward Gholson Demske, RN 07/04/2015, 10:37 AM

## 2015-07-04 NOTE — Progress Notes (Signed)
[facility] PROGRESS NOTE  [name]:[id number] DOB: 1950-08-11 DOA: 07/03/2015 PCP: Cassell Smiles., MD  Assessment/Plan: 1. Sepsis. Related to pneumonia. Lactic acid is trended down. Hemodynamics are stabilizing. Fever is resolving. Follow blood cultures. Influenza test was negative. Continue antibiotics. 2. Community-acquired pneumonia. Chest x-ray revealed multilobar right lung pneumonia. Continue on intravenous antibiotics. Follow-up urinary antigens. 3. Acute respiratory failure with hypoxia. Continue to wean off oxygen as tolerated. 4. Anxiety. Continue diazepam. 5. Hyperlipidemia. Continue statin. 6. GERD. Continue PPI.  Code Status: full code Family Communication: discussed with family at the bedside Disposition Plan: discharge home in the next 1-2 days   Consultants:    Procedures:    Antibiotics:  Rocephin 3/14  Azithromycin 3/14  HPI/Subjective: Overall breathing is improving. He has productive cough, wheezing is improving  Objective: Filed Vitals:   07/03/15 2210 07/04/15 0445  BP: 151/92 150/88  Pulse: 114 114  Temp: 98.7 F (37.1 C) 98.6 F (37 C)  Resp: 22 20    Intake/Output Summary (Last 24 hours) at 07/04/15 1151 Last data filed at 07/04/15 1123  Gross per 24 hour  Intake    240 ml  Output   2275 ml  Net  -2035 ml   Filed Weights   07/03/15 0616 07/03/15 0925  Weight: 88.451 kg (195 lb) 91.4 kg (201 lb 8 oz)    Exam:   General:  NAD  Cardiovascular: S1, S2 RRR  Respiratory: cta b  Abdomen: soft, nt, nd, bs+  Musculoskeletal: no edema b/l   Data Reviewed: Basic Metabolic Panel:  Recent Labs Lab 07/03/15 0618 07/04/15 0420  NA 137 136  K 4.4 3.9  CL 105 103  CO2 23 26  GLUCOSE 115* 93  BUN 14 12  CREATININE 1.22 1.05  CALCIUM 8.9 8.5*   Liver Function Tests:  Recent Labs Lab 07/03/15 0618 07/04/15 0420  AST 27 16  ALT 32 21  ALKPHOS 84 67  BILITOT 1.0 1.1  PROT 7.1 6.2*  ALBUMIN 4.2 3.6    No results for input(s): LIPASE, AMYLASE in the last 168 hours. No results for input(s): AMMONIA in the last 168 hours. CBC:  Recent Labs Lab 07/03/15 0618 07/04/15 0420  WBC 15.6* 18.4*  NEUTROABS 13.9*  --   HGB 16.7 14.1  HCT 48.5 42.5  MCV 89.6 91.8  PLT 268 231   Cardiac Enzymes:  Recent Labs Lab 07/03/15 0618  TROPONINI <0.03   BNP (last 3 results)  Recent Labs  07/03/15 0618  BNP 20.0    ProBNP (last 3 results) No results for input(s): PROBNP in the last 8760 hours.  CBG:  Recent Labs Lab 07/03/15 1149  GLUCAP 175*    Recent Results (from the past 240 hour(s))  Blood Culture (routine x 2)     Status: None (Preliminary result)   Collection Time: 07/03/15  6:23 AM  Result Value Ref Range Status   Specimen Description BLOOD LEFT ANTECUBITAL  Final   Special Requests BOTTLES DRAWN AEROBIC AND ANAEROBIC 10CC EACH  Final   Culture NO GROWTH 1 DAY  Final   Report Status PENDING  Incomplete  Blood Culture (routine x 2)     Status: None (Preliminary result)   Collection Time: 07/03/15  6:26 AM  Result Value Ref Range Status   Specimen Description BLOOD LEFT HAND  Final   Special Requests BOTTLES DRAWN AEROBIC AND ANAEROBIC 10CC  Final   Culture NO GROWTH 1 DAY  Final   Report Status PENDING  Incomplete  MRSA PCR Screening     Status: None   Collection Time: 07/03/15  9:30 AM  Result Value Ref Range Status   MRSA by PCR NEGATIVE NEGATIVE Final    Comment:        The GeneXpert MRSA Assay (FDA approved for NASAL specimens only), is one component of a comprehensive MRSA colonization surveillance program. It is not intended to diagnose MRSA infection nor to guide or monitor treatment for MRSA infections.   Urine culture     Status: None   Collection Time: 07/03/15 10:28 AM  Result Value Ref Range Status   Specimen Description URINE, CLEAN CATCH  Final   Special Requests NONE  Final   Culture   Final    NO GROWTH 1 DAY [facility]    Report Status 07/04/2015 FINAL  Final  Culture, sputum-assessment     Status: None   Collection Time: 07/03/15  6:11 PM  Result Value Ref Range Status   Specimen Description SPUTUM EXPECTORATED  Final   Special Requests NONE  Final   Sputum evaluation   Final    THIS SPECIMEN IS ACCEPTABLE. RESPIRATORY CULTURE REPORT TO FOLLOW. Performed at [facility]    Report Status 07/03/2015 FINAL  Final     Studies: Dg Chest Port 1 View  07/03/2015  CLINICAL DATA:  Cough, fever and shortness of Breath. No time course given. EXAM: PORTABLE CHEST 1 VIEW COMPARISON:  Chest x-ray 03/22/2015. FINDINGS: The cardiac silhouette, mediastinal and hilar contours are within normal limits and stable. There is mild tortuosity of the thoracic aorta. Right lung airspace process most likely pneumonia. No pleural effusion. The left lung is clear. IMPRESSION: Multilobar right lung pneumonia. Electronically Signed   By: Rudie MeyerP.  Gallerani M.D.   On: 07/03/2015 07:10    Scheduled Meds: . antiseptic oral rinse  7 mL Mouth Rinse BID  . aspirin EC  81 mg Oral Daily  . azithromycin  500 mg Intravenous Q24H  . cefTRIAXone (ROCEPHIN)  IV  1 g Intravenous Q24H  . diazepam  10 mg Oral TID  . enoxaparin (LOVENOX) injection  40 mg Subcutaneous Q24H  . guaiFENesin  1,200 mg Oral BID  . ipratropium-albuterol  3 mL Nebulization Q6H  . magnesium oxide  400 mg Oral Daily  . pantoprazole  40 mg Oral BID AC  . simvastatin  20 mg Oral q1800   Continuous Infusions: . sodium chloride      Principal Problem:   CAP (community acquired pneumonia) Active Problems:   Dyslipidemia   COPD (chronic obstructive pulmonary disease) (HCC)   Anxiety   Sepsis (HCC)   Acute respiratory failure with hypoxia (HCC)   GERD (gastroesophageal reflux disease)    Time spent: 25mins    [facility] HospitalMEMON,JEHANZEB  [facility] Pager [phone]. If 7PM-7AM, please contact night-coverage at [email or web address], password [email or web address] 07/04/2015,  11:51 AM  LOS: 1 day

## 2015-07-05 DIAGNOSIS — J439 Emphysema, unspecified: Secondary | ICD-10-CM

## 2015-07-05 DIAGNOSIS — J441 Chronic obstructive pulmonary disease with (acute) exacerbation: Secondary | ICD-10-CM | POA: Insufficient documentation

## 2015-07-05 LAB — BASIC METABOLIC PANEL
ANION GAP: 7 (ref 5–15)
BUN: 11 mg/dL (ref 6–20)
CALCIUM: 8.3 mg/dL — AB (ref 8.9–10.3)
CO2: 27 mmol/L (ref 22–32)
Chloride: 104 mmol/L (ref 101–111)
Creatinine, Ser: 0.97 mg/dL (ref 0.61–1.24)
GLUCOSE: 106 mg/dL — AB (ref 65–99)
Potassium: 3.8 mmol/L (ref 3.5–5.1)
SODIUM: 138 mmol/L (ref 135–145)

## 2015-07-05 LAB — CBC
HCT: 38.6 % — ABNORMAL LOW (ref 39.0–52.0)
HCT: 41.7 % (ref 39.0–52.0)
HEMOGLOBIN: 13 g/dL (ref 13.0–17.0)
HEMOGLOBIN: 14 g/dL (ref 13.0–17.0)
MCH: 30.8 pg (ref 26.0–34.0)
MCH: 31 pg (ref 26.0–34.0)
MCHC: 33.7 g/dL (ref 30.0–36.0)
MCHC: 33.6 g/dL (ref 30.0–36.0)
MCV: 91.5 fL (ref 78.0–100.0)
MCV: 92.5 fL (ref 78.0–100.0)
PLATELETS: 200 10*3/uL (ref 150–400)
Platelets: 170 10*3/uL (ref 150–400)
RBC: 4.22 MIL/uL (ref 4.22–5.81)
RBC: 4.51 MIL/uL (ref 4.22–5.81)
RDW: 12.9 % (ref 11.5–15.5)
RDW: 12.9 % (ref 11.5–15.5)
WBC: 11.5 10*3/uL — ABNORMAL HIGH (ref 4.0–10.5)
WBC: 7.9 10*3/uL (ref 4.0–10.5)

## 2015-07-05 LAB — LEGIONELLA PNEUMOPHILA SEROGP 1 UR AG: L. PNEUMOPHILA SEROGP 1 UR AG: NEGATIVE

## 2015-07-05 MED ORDER — LEVALBUTEROL HCL 0.63 MG/3ML IN NEBU: 0.6300 mg | INHALATION_SOLUTION | RESPIRATORY_TRACT | Status: DC | PRN

## 2015-07-05 MED ORDER — ARFORMOTEROL TARTRATE 15 MCG/2ML IN NEBU
15.0000 ug | INHALATION_SOLUTION | Freq: Two times a day (BID) | RESPIRATORY_TRACT | Status: DC
  Administered 2015-07-05 – 2015-07-06 (×3): 15 ug via RESPIRATORY_TRACT
  Filled 2015-07-05 (×3): qty 2

## 2015-07-05 MED ORDER — METHYLPREDNISOLONE SODIUM SUCC 125 MG IJ SOLR
80.0000 mg | Freq: Once | INTRAMUSCULAR | Status: AC
  Administered 2015-07-05: 80 mg via INTRAVENOUS
  Filled 2015-07-05: qty 2

## 2015-07-05 MED ORDER — BUDESONIDE 0.25 MG/2ML IN SUSP
0.2500 mg | Freq: Two times a day (BID) | RESPIRATORY_TRACT | Status: DC
  Administered 2015-07-05 – 2015-07-06 (×3): 0.25 mg via RESPIRATORY_TRACT
  Filled 2015-07-05 (×3): qty 2

## 2015-07-05 MED ORDER — LORATADINE 10 MG PO TABS
10.0000 mg | ORAL_TABLET | Freq: Every day | ORAL | Status: DC
  Administered 2015-07-05 – 2015-07-06 (×2): 10 mg via ORAL
  Filled 2015-07-05 (×2): qty 1

## 2015-07-05 MED ORDER — LEVOFLOXACIN 750 MG PO TABS: 750.0000 mg | ORAL_TABLET | Freq: Every day | ORAL | Status: DC

## 2015-07-05 MED ORDER — IPRATROPIUM BROMIDE 0.02 % IN SOLN: 0.5000 mg | RESPIRATORY_TRACT | Status: DC | PRN

## 2015-07-05 MED ORDER — LEVALBUTEROL HCL 0.63 MG/3ML IN NEBU
0.6300 mg | INHALATION_SOLUTION | Freq: Four times a day (QID) | RESPIRATORY_TRACT | Status: DC
  Administered 2015-07-05 – 2015-07-06 (×4): 0.63 mg via RESPIRATORY_TRACT
  Filled 2015-07-05 (×5): qty 3

## 2015-07-05 MED ORDER — IPRATROPIUM BROMIDE 0.02 % IN SOLN
0.5000 mg | Freq: Four times a day (QID) | RESPIRATORY_TRACT | Status: DC
  Administered 2015-07-05 – 2015-07-06 (×4): 0.5 mg via RESPIRATORY_TRACT
  Filled 2015-07-05 (×5): qty 2.5

## 2015-07-05 MED ORDER — PREDNISONE 20 MG PO TABS
60.0000 mg | ORAL_TABLET | Freq: Every day | ORAL | Status: DC
  Administered 2015-07-06: 60 mg via ORAL
  Filled 2015-07-05: qty 3

## 2015-07-05 NOTE — Progress Notes (Signed)
Pt. Ambulated to nurses station and back to room. Minimal SOB. O2 sat's were consistent between 88-91 without O2 while ambulating. [place]ittany Elika God[place], [place].

## 2015-07-05 NOTE — Progress Notes (Signed)
[facility] PROGRESS NOTE  Fabio NeighborsRoy L Hett ZOX:[id number] DOB: 09/16/1950 DOA: 07/03/2015 PCP: Cassell SmilesFUSCO,[name], MD  Assessment/Plan: #1 sepsis secondary to community-acquired multilobar pneumonia patient was some clinical improvement. Fevers trending down. Influenza PCR negative. Blood cultures pending. Sputum Gram stain and culture negative. Urine strep pneumococcus antigen negative. WBC trending down. Lactic acid level trending down. Continue oxygen, IV Rocephin and IV azithromycin. Will change duo nebs to Xopenex and Atrovent nebs secondary to tachycardia. Will add Pulmicort and Brovana. Continue Mucinex. Transition to oral Levaquin tomorrow.  #2 mild COPD exacerbation Patient noted with wheezing on examination. Continue oxygen, IV Rocephin and IV azithromycin. Change nebulizers to Xopenex and Atrovent nebs. At Pulmicort and Brovana. Will give a dose of IV Solu-Medrol today and start a quick steroid taper tomorrow. Continue Mucinex. Follow.  #3 anxiety Stable. Continue diazepam.  #4 hyperlipidemia Continue statin.  #5 gastroesophageal reflux disease PPI.  Code Status: Full Family Communication: Updated patient and wife at bedside. Disposition Plan: Home when medically stable and on oral antibiotics with significant clinical improvement and no hypoxia, hopefully 1-2 days.    Consultants:  None  Procedures:  Chest x-ray 07/03/2015  Antibiotics:  IV azithromycin 07/03/2015>>>> 07/05/2015   IV Rocephin 07/03/2015>>>>> 07/05/2015    oral Levaquin 07/06/2015  HPI/Subjective: Patient states feels a little bit better than on admission. Still with some shortness of breath. No chest pain.  Objective: Filed Vitals:   07/05/15 0445 07/05/15 1000  BP: 137/90 110/75  Pulse: 112 93  Temp: 99 F (37.2 C) 97.5 F (36.4 C)  Resp: 20     Intake/Output Summary (Last 24 hours) at 07/05/15 1242 Last data filed at 07/05/15 0824  Gross per 24 hour  Intake    320 ml   Output   2625 ml  Net  -2305 ml   Filed Weights   07/03/15 0616 07/03/15 0925  Weight: 88.451 kg (195 lb) 91.4 kg (201 lb 8 oz)    Exam:   General:  NAD  Cardiovascular: RRR  Respiratory: Expiratory wheezing. No crackles. Some coarse sounds.   Abdomen: soft, nontender, nondistended, positive bowel sounds.   Musculoskeletal:  no clubbing cyanosis or edema.   Data Reviewed: Basic Metabolic Panel:  Recent Labs Lab 07/03/15 0618 07/04/15 0420 07/05/15 0420  NA 137 136 138  K 4.4 3.9 3.8  CL 105 103 104  CO2 23 26 27   GLUCOSE 115* 93 106*  BUN 14 12 11   CREATININE 1.22 1.05 0.97  CALCIUM 8.9 8.5* 8.3*   Liver Function Tests:  Recent Labs Lab 07/03/15 0618 07/04/15 0420  AST 27 16  ALT 32 21  ALKPHOS 84 67  BILITOT 1.0 1.1  PROT 7.1 6.2*  ALBUMIN 4.2 3.6   No results for input(s): LIPASE, AMYLASE in the last 168 hours. No results for input(s): AMMONIA in the last 168 hours. CBC:  Recent Labs Lab 07/03/15 0618 07/04/15 0420 07/05/15 0420  WBC 15.6* 18.4* 11.5*  NEUTROABS 13.9*  --   --   HGB 16.7 14.1 13.0  HCT 48.5 42.5 38.6*  MCV 89.6 91.8 91.5  PLT 268 231 200   Cardiac Enzymes:  Recent Labs Lab 07/03/15 0618  TROPONINI <0.03   BNP (last 3 results)  Recent Labs  07/03/15 0618  BNP 20.0    ProBNP (last 3 results) No results for input(s): PROBNP in the last 8760 hours.  CBG:  Recent Labs Lab 07/03/15 1149  GLUCAP 175*    Recent Results (from the past 240 hour(s))  Blood Culture (routine x 2)     Status: None (Preliminary result)   Collection Time: 07/03/15  6:23 AM  Result Value Ref Range Status   Specimen Description BLOOD LEFT ANTECUBITAL  Final   Special Requests BOTTLES DRAWN AEROBIC AND ANAEROBIC 10CC EACH  Final   Culture NO GROWTH 2 DAYS  Final   Report Status PENDING  Incomplete  Blood Culture (routine x 2)     Status: None (Preliminary result)   Collection Time: 07/03/15  6:26 AM  Result Value Ref Range Status    Specimen Description BLOOD LEFT HAND  Final   Special Requests BOTTLES DRAWN AEROBIC AND ANAEROBIC 10CC  Final   Culture NO GROWTH 2 DAYS  Final   Report Status PENDING  Incomplete  MRSA PCR Screening     Status: None   Collection Time: 07/03/15  9:30 AM  Result Value Ref Range Status   MRSA by PCR NEGATIVE NEGATIVE Final    Comment:        The GeneXpert MRSA Assay (FDA approved for NASAL specimens only), is one component of a comprehensive MRSA colonization surveillance program. It is not intended to diagnose MRSA infection nor to guide or monitor treatment for MRSA infections.   Urine culture     Status: None   Collection Time: 07/03/15 10:28 AM  Result Value Ref Range Status   Specimen Description URINE, CLEAN CATCH  Final   Special Requests NONE  Final   Culture   Final    NO GROWTH 1 DAY Performed at [facility]    Report Status 07/04/2015 FINAL  Final  Culture, sputum-assessment     Status: None   Collection Time: 07/03/15  6:11 PM  Result Value Ref Range Status   Specimen Description SPUTUM EXPECTORATED  Final   Special Requests NONE  Final   Sputum evaluation   Final    THIS SPECIMEN IS ACCEPTABLE. RESPIRATORY CULTURE REPORT TO FOLLOW. Performed at [facility]    Report Status 07/03/2015 FINAL  Final  Culture, respiratory (NON-Expectorated)     Status: None (Preliminary result)   Collection Time: 07/03/15  6:11 PM  Result Value Ref Range Status   Specimen Description SPUTUM EXPECTORATED  Final   Special Requests NONE  Final   Gram Stain PENDING  Incomplete   Culture   Final    NORMAL OROPHARYNGEAL FLORA Performed at [facility]    Report Status PENDING  Incomplete     Studies: No results found.  Scheduled Meds: . antiseptic oral rinse  7 mL Mouth Rinse BID  . arformoterol  15 mcg Nebulization BID  . aspirin EC  81 mg Oral Daily  . azithromycin  500 mg Intravenous Q24H  . budesonide (PULMICORT) nebulizer solution  0.25 mg  Nebulization BID  . cefTRIAXone (ROCEPHIN)  IV  1 g Intravenous Q24H  . diazepam  10 mg Oral TID  . enoxaparin (LOVENOX) injection  40 mg Subcutaneous Q24H  . guaiFENesin  1,200 mg Oral BID  . ipratropium  0.5 mg Nebulization Q6H  . levalbuterol  0.63 mg Nebulization Q6H  . loratadine  10 mg Oral Daily  . magnesium oxide  400 mg Oral Daily  . methylPREDNISolone (SOLU-MEDROL) injection  80 mg Intravenous Once  . pantoprazole  40 mg Oral BID AC  . simvastatin  20 mg Oral q1800   Continuous Infusions: . sodium chloride 75 mL/hr at 07/05/15 0818    Principal Problem:   CAP (community acquired pneumonia) Active  Problems:   Dyslipidemia   COPD (chronic obstructive pulmonary disease) (HCC)   Anxiety   Sepsis (HCC)   Acute respiratory failure with hypoxia (HCC)   GERD (gastroesophageal reflux disease)    Time spent: 35 minutes    Lucianne Smestad M.D. [facility] Pager [phone]. If 7PM-7AM, please contact night-coverage at [email or web address], password [facility] 07/05/2015, 12:42 PM  LOS: 2 days

## 2015-07-06 DIAGNOSIS — A419 Sepsis, unspecified organism: Principal | ICD-10-CM

## 2015-07-06 DIAGNOSIS — J441 Chronic obstructive pulmonary disease with (acute) exacerbation: Secondary | ICD-10-CM

## 2015-07-06 DIAGNOSIS — J9601 Acute respiratory failure with hypoxia: Secondary | ICD-10-CM

## 2015-07-06 DIAGNOSIS — J189 Pneumonia, unspecified organism: Secondary | ICD-10-CM

## 2015-07-06 LAB — CULTURE, RESPIRATORY W GRAM STAIN: Culture: NORMAL

## 2015-07-06 LAB — BASIC METABOLIC PANEL
ANION GAP: 8 (ref 5–15)
BUN: 14 mg/dL (ref 6–20)
CHLORIDE: 105 mmol/L (ref 101–111)
CO2: 27 mmol/L (ref 22–32)
Calcium: 8.9 mg/dL (ref 8.9–10.3)
Creatinine, Ser: 0.86 mg/dL (ref 0.61–1.24)
GFR calc non Af Amer: >60 mL/min (ref >60)
Glucose, Bld: 164 mg/dL — ABNORMAL HIGH (ref 65–99)
POTASSIUM: 4.4 mmol/L (ref 3.5–5.1)
SODIUM: 140 mmol/L (ref 135–145)

## 2015-07-06 LAB — CULTURE, RESPIRATORY

## 2015-07-06 MED ORDER — CEFUROXIME AXETIL 250 MG PO TABS
500.0000 mg | ORAL_TABLET | Freq: Two times a day (BID) | ORAL | Status: DC
  Administered 2015-07-06: 500 mg via ORAL
  Filled 2015-07-06: qty 2

## 2015-07-06 MED ORDER — AZITHROMYCIN 500 MG PO TABS: 500.0000 mg | ORAL_TABLET | Freq: Every day | ORAL | Status: DC

## 2015-07-06 MED ORDER — CEFUROXIME AXETIL 500 MG PO TABS: 500.0000 mg | ORAL_TABLET | Freq: Two times a day (BID) | ORAL | Status: DC

## 2015-07-06 MED ORDER — PREDNISONE 10 MG PO TABS: ORAL_TABLET | ORAL | Status: DC

## 2015-07-06 MED ORDER — AZITHROMYCIN 250 MG PO TABS
500.0000 mg | ORAL_TABLET | Freq: Every day | ORAL | Status: DC
  Administered 2015-07-06: 500 mg via ORAL
  Filled 2015-07-06: qty 2

## 2015-07-06 NOTE — Care Management Important Message (Signed)
Important Message  Patient Details  Name: [name] MRN: [id number] Date of Birth: [birth year]   Medicare Important Message Given:  Yes    Malcolm MetroChildress, Carlon Chaloux Demske, RN 07/06/2015, 11:56 AM

## 2015-07-06 NOTE — Progress Notes (Signed)
Discharged to home with family and O2

## 2015-07-06 NOTE — Discharge Summary (Signed)
Physician Discharge Summary  [name] NWG:[id number] DOB: August 05, 1950 DOA: 07/03/2015  PCP: Cassell Smiles., MD  Admit date: 07/03/2015 Discharge date: 07/06/2015  Recommendations for Outpatient Follow-up:  1. Follow-up resolution of pneumonia. 2. Started on oxygen for hypoxic respiratory failure secondary to pneumonia and COPD. May be able to wean as an outpatient.   Follow-up Information    Follow up with Cassell Smiles., MD In 1 week.   Specialty:  Internal Medicine   Why:  Spoke with office who will contact patient to schedule.  Could not schedule at this time.   Contact information:   [address] [place] [address] [phone]      Discharge Diagnoses:  1. Sepsis, secondary to Pneumonia 2. Multilobar right-sided community-acquired pneumonia 3. Acute hypoxic respiratory failure 4. COPD exacerbation 5. Tobacco use disorder in remission  Discharge Condition: Improved Disposition: Discharge home with 2L O2 and abx  Diet recommendation: regular  Filed Weights   07/03/15 0616 07/03/15 0925  Weight: 88.451 kg (195 lb) 91.4 kg (201 lb 8 oz)    History of present illness:  65 year old man PMH COPD without hypoxic respiratory failure presented with shortness of breath cough and fever. Admitted for sepsis secondary to pneumonia, acute hypoxic respiratory failure.  Hospital Course:  Pt presented to the hospital with complaints of a fever and cough. He was found to have sepsis, secondary to CAP. He was subsequently started on abx. His COPD was mildly exacerbated, for which he was started on nebs, steroids, and abx. Other chronic issues were treated accordingly. He appeared improved as of 3/17 and was ready for discharge. He will be discharged home with 2L O2 and abx. He will need to follow up for PNA with his PCP.  1. Sepsis secondary to pneumonia. Resolved. Influenza PCR negative. Sputum culture run with normal oropharyngeal flora. Pneumococcus antigen screen  negative. Leukocytosis has resolved and remains afebrile now greater than 48 hours. 2. Multilobar right community-acquired pneumonia. Much improved.  3. Acute hypoxic respiratory failure. Stable. 4. COPD with acute exacerbation. Does not use oxygen at home. 5. Tobacco use disorder in remission.  Consultants:  none  Procedures:  none  Antibiotics:  Azithromycin 3/14 >> 3/16  Rocephin 3/14 >> 3/16  Levaquin 3/17 >> 3/17  Zithromax 3/17 >> 3/18  Ceftin 3/17 >> 20   Discharge Instructions  Discharge Instructions    Activity as tolerated - No restrictions    Complete by:  As directed      Diet general    Complete by:  As directed      Discharge instructions    Complete by:  As directed   Call your physician or seek immediate medical attention for shortness of breath, fever or worsening of condition.          Discharge Medication List as of 07/06/2015  1:27 PM    START taking these medications   Details  azithromycin (ZITHROMAX) 500 MG tablet Take 1 tablet (500 mg total) by mouth daily. Start 3/18 in AM, Starting 07/06/2015, Until Discontinued, Normal    cefUROXime (CEFTIN) 500 MG tablet Take 1 tablet (500 mg total) by mouth 2 (two) times daily with a meal., Starting 07/06/2015, Until Discontinued, Normal      CONTINUE these medications which have CHANGED   Details  predniSONE (DELTASONE) 10 MG tablet 40 mg by mouth daily x3 days, then 20 mg by mouth daily x3 days, then 10 mg by mouth daily x3 days, then stop., Normal  CONTINUE these medications which have NOT CHANGED   Details  albuterol (PROVENTIL HFA;VENTOLIN HFA) 108 (90 BASE) MCG/ACT inhaler Inhale 2 puffs into the lungs every 6 (six) hours as needed for wheezing or shortness of breath., Until Discontinued, Historical Med    albuterol (PROVENTIL) 2 MG tablet Take 2 mg by mouth 2 (two) times daily., Until Discontinued, Historical Med    Aromatic Inhalants (VICKS VAPOR INHALER IN) Place 1 puff into both  nostrils daily as needed (congestion)., Until Discontinued, Historical Med    aspirin EC 81 MG tablet Take 81 mg by mouth daily., Until Discontinued, Historical Med    diazepam (VALIUM) 10 MG tablet Take 10 mg by mouth 3 (three) times daily. , Until Discontinued, Historical Med    magnesium oxide (MAG-OX) 400 MG tablet Take 400 mg by mouth daily., Until Discontinued, Historical Med    omeprazole (PRILOSEC) 40 MG capsule Take 40 mg by mouth 2 (two) times daily as needed (heartburn). , Starting 01/10/2013, Until Discontinued, Historical Med    oxyCODONE-acetaminophen (PERCOCET) 7.5-325 MG per tablet Take 1 tablet by mouth every 4 (four) hours as needed for pain., Until Discontinued, Historical Med    Propylene Glycol (SYSTANE BALANCE) 0.6 % SOLN Place 1 drop into both eyes 2 (two) times daily as needed (dry eyes)., Until Discontinued, Historical Med    simvastatin (ZOCOR) 20 MG tablet TAKE 1 TABLET BY MOUTH DAILY AT BEDTIME., Normal       Allergies  Allergen Reactions  . Ativan [Lorazepam] Other (See Comments)    Dry mouth sinus and breathing problems     The results of significant diagnostics from this hospitalization (including imaging, microbiology, ancillary and laboratory) are listed below for reference.    Significant Diagnostic Studies: Dg Chest Port 1 View  07/03/2015  CLINICAL DATA:  Cough, fever and shortness of Breath. No time course given. EXAM: PORTABLE CHEST 1 VIEW COMPARISON:  Chest x-ray 03/22/2015. FINDINGS: The cardiac silhouette, mediastinal and hilar contours are within normal limits and stable. There is mild tortuosity of the thoracic aorta. Right lung airspace process most likely pneumonia. No pleural effusion. The left lung is clear. IMPRESSION: Multilobar right lung pneumonia. Electronically Signed   By: Rudie MeyerP.  Gallerani M.D.   On: 07/03/2015 07:10    Microbiology: Recent Results (from the past 240 hour(s))  Blood Culture (routine x 2)     Status: None (Preliminary  result)   Collection Time: 07/03/15  6:23 AM  Result Value Ref Range Status   Specimen Description BLOOD LEFT ANTECUBITAL  Final   Special Requests BOTTLES DRAWN AEROBIC AND ANAEROBIC 10CC EACH  Final   Culture NO GROWTH 2 DAYS  Final   Report Status PENDING  Incomplete  Blood Culture (routine x 2)     Status: None (Preliminary result)   Collection Time: 07/03/15  6:26 AM  Result Value Ref Range Status   Specimen Description BLOOD LEFT HAND  Final   Special Requests BOTTLES DRAWN AEROBIC AND ANAEROBIC 10CC  Final   Culture NO GROWTH 2 DAYS  Final   Report Status PENDING  Incomplete  MRSA PCR Screening     Status: None   Collection Time: 07/03/15  9:30 AM  Result Value Ref Range Status   MRSA by PCR NEGATIVE NEGATIVE Final    Comment:        The GeneXpert MRSA Assay (FDA approved for NASAL specimens only), is one component of a comprehensive MRSA colonization surveillance program. It is not intended to diagnose MRSA infection  nor to guide or monitor treatment for MRSA infections.   Urine culture     Status: None   Collection Time: 07/03/15 10:28 AM  Result Value Ref Range Status   Specimen Description URINE, CLEAN CATCH  Final   Special Requests NONE  Final   Culture   Final    NO GROWTH 1 DAY Performed at [facility]    Report Status 07/04/2015 FINAL  Final  Culture, sputum-assessment     Status: None   Collection Time: 07/03/15  6:11 PM  Result Value Ref Range Status   Specimen Description SPUTUM EXPECTORATED  Final   Special Requests NONE  Final   Sputum evaluation   Final    THIS SPECIMEN IS ACCEPTABLE. RESPIRATORY CULTURE REPORT TO FOLLOW. Performed at [facility]    Report Status 07/03/2015 FINAL  Final  Culture, respiratory (NON-Expectorated)     Status: None   Collection Time: 07/03/15  6:11 PM  Result Value Ref Range Status   Specimen Description SPUTUM EXPECTORATED  Final   Special Requests NONE  Final   Gram Stain   Final    ABUNDANT  WBC PRESENT,BOTH PMN AND MONONUCLEAR NO SQUAMOUS EPITHELIAL CELLS SEEN FEW GRAM POSITIVE COCCI IN PAIRS Performed at [facility]    Culture   Final    NORMAL OROPHARYNGEAL FLORA Performed at [facility]    Report Status 07/06/2015 FINAL  Final     Labs: Basic Metabolic Panel:  Recent Labs Lab 07/03/15 0618 07/04/15 0420 07/05/15 0420 07/06/15 0438  NA 137 136 138 140  K 4.4 3.9 3.8 4.4  CL 105 103 104 105  CO2 GLUCOSE 115* 93 106* 164*  BUN CREATININE 1.22 1.05 0.97 0.86  CALCIUM 8.9 8.5* 8.3* 8.9   Liver Function Tests:  Recent Labs Lab 07/03/15 0618 07/04/15 0420  AST 27 16  ALT 32 21  ALKPHOS 84 67  BILITOT 1.0 1.1  PROT 7.1 6.2*  ALBUMIN 4.2 3.6     CBC:  Recent Labs Lab 07/03/15 0618 07/04/15 0420 07/05/15 0420 07/05/15 1843  WBC 15.6* 18.4* 11.5* 7.9  NEUTROABS 13.9*  --   --   --   HGB 16.7 14.1 13.0 14.0  HCT 48.5 42.5 38.6* 41.7  MCV 89.6 91.8 91.5 92.5  PLT 268 231 200 170   Cardiac Enzymes:  Recent Labs Lab 07/03/15 0618  TROPONINI <0.03      Recent Labs  07/03/15 0618  BNP 20.0      CBG:  Recent Labs Lab 07/03/15 1149  GLUCAP 175*    Principal Problem:   CAP (community acquired pneumonia) Active Problems:   Dyslipidemia   COPD (chronic obstructive pulmonary disease) (HCC)   Anxiety   Sepsis (HCC)   Acute respiratory failure with hypoxia (HCC)   GERD (gastroesophageal reflux disease)   COPD exacerbation (HCC)   Time coordinating discharge: 35 minutes  Signed:  Brendia Sacks, MD [facility] 07/06/2015, 7:40 PM   By signing my name below, I, Adron Bene, attest that this documentation has been prepared under the direction and in the presence of Daanya Lanphier P. Irene Limbo, MD. Electronically Signed: Adron Bene, Scribe.  07/06/2015 11:20am  I personally performed the services described in this documentation. All medical record entries made by the  scribe were at my direction. I have reviewed the chart and agree that the record reflects my personal performance and is accurate and complete. Brendia Sacks, MD

## 2015-07-06 NOTE — Progress Notes (Signed)
SATURATION QUALIFICATIONS: (This note is used to comply with regulatory documentation for home oxygen)  Patient Saturations on Room Air at Rest = 97%  Patient Saturations on Room Air while Ambulating = 86%  Patient Saturations on 0 Liters of oxygen while Ambulating = 86%  Please briefly explain why patient needs home oxygen: pt ambulated in halls on room air O2 dropped after some distance to 86%  Pt returned to room O2 2l initiated Pt recovered to 97% after 3 minutes

## 2015-07-06 NOTE — Progress Notes (Signed)
Pt ambulated in hall without O2 Sat dropped to dropped to 86% after some distance.  Pt returned to room O2 applied

## 2015-07-06 NOTE — Care Management Note (Signed)
Case Management Note  Patient Details  Name: Fabio NeighborsRoy L Cubit MRN: [id number] Date of Birth: 05-06-1950  Expected Discharge Date:   07/06/2015               Expected Discharge Plan:  Home/Self Care  In-House Referral:  NA  Discharge planning Services  CM Consult  Post Acute Care Choice:  Durable Medical Equipment Choice offered to:  Patient  DME Arranged:  Oxygen DME Agency:  [facility]  [facility] Agency:     Status of Service:  Completed, signed off  Medicare Important Message Given:  Yes Date Medicare IM Given:    Medicare IM give by:    Date Additional Medicare IM Given:    Additional Medicare Important Message give by:     If discussed at Long Length of Stay Meetings, dates discussed:    Additional Comments: Pt discharging home today with self care. Pt has qualified for home O2. [facility]. Alroy BailiffLinda Lothian, of [facility], made aware of referral and will obtain pt info from chart. Pt will have port O2 delivered to room prior to DC.  Malcolm Metrohildress, Claryssa Sandner Demske, RN 07/06/2015, 11:57 AM

## 2015-07-06 NOTE — Progress Notes (Signed)
PROGRESS NOTE  Edward NeighborsRoy L Cirigliano ZOX:[id number] DOB: 03-Sep-1950 DOA: 07/03/2015 PCP: Cassell SmilesFUSCO,[name], MD  Summary: 65 year old man PMH COPD without hypoxic respiratory failure presented with shortness of breath cough and fever. Admitted for sepsis secondary to pneumonia, acute hypoxic respiratory failure.  Assessment/Plan: 1. Sepsis secondary to pneumonia. Resolved. Influenza PCR negative. Sputum culture run with normal oropharyngeal flora. Pneumococcus antigen screen negative. Leukocytosis has resolved and remains afebrile now greater than 48 hours. 2. Multilobar right community-acquired pneumonia. Much improved.  3. Acute hypoxic respiratory failure. Stable. 4. COPD with acute exacerbation. Does not use oxygen at home. 5. Tobacco use disorder in remission.   Much improved. Plan discharge home today on oxygen. Complete antibiotics as an outpatient. Steroid taper.  Discussed with wife at bedside  Brendia Sacksaniel Mariachristina Holle, MD  [facility]  Pager [phone] If 7PM-7AM, please contact night-coverage at [email or web address], password [email or web address] 07/06/2015, 8:46 AM  LOS: 3 days   Consultants:  none  Procedures:  none  Antibiotics:  Azithromycin 3/14 >> 3/16  Rocephin 3/14 >> 3/16  Levaquin 3/17 >> 3/17  Zithromax 3/17 >> 3/18  Ceftin 3/17 >> 20  HPI/Subjective: Feels much better. Breathing is fine. Denies N/V. Coughing a little. Is eating okay and is able to ambulate to the restroom.   Objective: Filed Vitals:   07/05/15 1956 07/05/15 2023 07/06/15 0543 07/06/15 0756  BP:  137/94 114/66   Pulse:  113 81 102  Temp:  98.2 F (36.8 C) 97.6 F (36.4 C)   TempSrc:  Oral Oral   Resp:  21 18 17   Height:      Weight:      SpO2: 95% 100% 99% 93%    Intake/Output Summary (Last 24 hours) at 07/06/15 0846 Last data filed at 07/06/15 0544  Gross per 24 hour  Intake    480 ml  Output   1750 ml  Net  -1270 ml     Filed Weights   07/03/15 0616 07/03/15 0925  Weight: 88.451 kg (195  lb) 91.4 kg (201 lb 8 oz)    Exam:    General:  Appears calm and comfortable Eyes: PERRL, normal lids, irises & conjunctiva ENT: grossly normal hearing, lips & tongue Cardiovascular: RRR, no m/r/g. No LE edema.  Respiratory: CTA bilaterally, no w/r/r. Normal respiratory effort. Psychiatric: grossly normal mood and affect, speech fluent and appropriate  New data reviewed:  BMP unremarkable  Glucose 164  Scheduled Meds: . antiseptic oral rinse  7 mL Mouth Rinse BID  . arformoterol  15 mcg Nebulization BID  . aspirin EC  81 mg Oral Daily  . azithromycin  500 mg Oral Daily  . budesonide (PULMICORT) nebulizer solution  0.25 mg Nebulization BID  . cefUROXime  500 mg Oral BID WC  . diazepam  10 mg Oral TID  . enoxaparin (LOVENOX) injection  40 mg Subcutaneous Q24H  . guaiFENesin  1,200 mg Oral BID  . ipratropium  0.5 mg Nebulization Q6H  . levalbuterol  0.63 mg Nebulization Q6H  . loratadine  10 mg Oral Daily  . magnesium oxide  400 mg Oral Daily  . pantoprazole  40 mg Oral BID AC  . predniSONE  60 mg Oral QAC breakfast  . simvastatin  20 mg Oral q1800   Continuous Infusions:   Principal Problem:   CAP (community acquired pneumonia) Active Problems:   Dyslipidemia   COPD (chronic obstructive pulmonary disease) (HCC)   Anxiety   Sepsis (HCC)   Acute respiratory failure with hypoxia (HCC)  GERD (gastroesophageal reflux disease)   COPD exacerbation (HCC)   By signing my name below, I, Adron Bene, attest that this documentation has been prepared under the direction and in the presence of Olisa Quesnel P. Irene Limbo, MD. Electronically Signed: Adron Bene, Scribe.  07/06/2015 11:19am  I personally performed the services described in this documentation. All medical record entries made by the scribe were at my direction. I have reviewed the chart and agree that the record reflects my personal performance and is accurate and complete. Brendia Sacks, MD

## 2015-07-08 LAB — CULTURE, BLOOD (ROUTINE X 2)
Culture: NO GROWTH
Culture: NO GROWTH

## 2015-07-10 ENCOUNTER — Ambulatory Visit: Payer: Medicare Other | Admitting: Internal Medicine

## 2015-07-11 ENCOUNTER — Encounter: Payer: Self-pay | Admitting: *Deleted

## 2015-07-11 DIAGNOSIS — J189 Pneumonia, unspecified organism: Secondary | ICD-10-CM | POA: Diagnosis not present

## 2015-07-11 DIAGNOSIS — Z1389 Encounter for screening for other disorder: Secondary | ICD-10-CM | POA: Diagnosis not present

## 2015-07-11 DIAGNOSIS — J449 Chronic obstructive pulmonary disease, unspecified: Secondary | ICD-10-CM | POA: Diagnosis not present

## 2015-07-11 DIAGNOSIS — J9600 Acute respiratory failure, unspecified whether with hypoxia or hypercapnia: Secondary | ICD-10-CM | POA: Diagnosis not present

## 2015-07-11 DIAGNOSIS — Z6828 Body mass index (BMI) 28.0-28.9, adult: Secondary | ICD-10-CM | POA: Diagnosis not present

## 2015-07-15 ENCOUNTER — Emergency Department (HOSPITAL_COMMUNITY): Payer: Medicare Other

## 2015-07-15 ENCOUNTER — Emergency Department (HOSPITAL_COMMUNITY)
Admission: EM | Admit: 2015-07-15 | Discharge: 2015-07-15 | Disposition: A | Discharge status: Home / Self Care | Payer: Medicare Other | Attending: Emergency Medicine | Admitting: Emergency Medicine

## 2015-07-15 ENCOUNTER — Encounter (HOSPITAL_COMMUNITY): Payer: Self-pay | Admitting: Emergency Medicine

## 2015-07-15 DIAGNOSIS — Z87891 Personal history of nicotine dependence: Secondary | ICD-10-CM | POA: Diagnosis not present

## 2015-07-15 DIAGNOSIS — R05 Cough: Secondary | ICD-10-CM | POA: Diagnosis not present

## 2015-07-15 DIAGNOSIS — Z7982 Long term (current) use of aspirin: Secondary | ICD-10-CM | POA: Insufficient documentation

## 2015-07-15 DIAGNOSIS — Z79899 Other long term (current) drug therapy: Secondary | ICD-10-CM | POA: Insufficient documentation

## 2015-07-15 DIAGNOSIS — R069 Unspecified abnormalities of breathing: Secondary | ICD-10-CM | POA: Diagnosis not present

## 2015-07-15 DIAGNOSIS — J441 Chronic obstructive pulmonary disease with (acute) exacerbation: Secondary | ICD-10-CM | POA: Diagnosis not present

## 2015-07-15 DIAGNOSIS — R0602 Shortness of breath: Secondary | ICD-10-CM | POA: Diagnosis not present

## 2015-07-15 LAB — CBC WITH DIFFERENTIAL/PLATELET
BASOS ABS: 0 10*3/uL (ref 0.0–0.1)
BASOS PCT: 0 %
EOS PCT: 1 %
Eosinophils Absolute: 0.1 10*3/uL (ref 0.0–0.7)
HCT: 40.5 % (ref 39.0–52.0)
Hemoglobin: 13.3 g/dL (ref 13.0–17.0)
Lymphocytes Relative: 18 %
Lymphs Abs: 1.6 10*3/uL (ref 0.7–4.0)
MCH: 30.6 pg (ref 26.0–34.0)
MCHC: 32.8 g/dL (ref 30.0–36.0)
MCV: 93.1 fL (ref 78.0–100.0)
MONO ABS: 1.2 10*3/uL — AB (ref 0.1–1.0)
MONOS PCT: 13 %
NEUTROS ABS: 6.1 10*3/uL (ref 1.7–7.7)
Neutrophils Relative %: 68 %
PLATELETS: 243 10*3/uL (ref 150–400)
RBC: 4.35 MIL/uL (ref 4.22–5.81)
RDW: 13.1 % (ref 11.5–15.5)
WBC: 9 10*3/uL (ref 4.0–10.5)

## 2015-07-15 LAB — BASIC METABOLIC PANEL
Anion gap: 8 (ref 5–15)
BUN: 11 mg/dL (ref 6–20)
CALCIUM: 7.9 mg/dL — AB (ref 8.9–10.3)
CO2: 30 mmol/L (ref 22–32)
CREATININE: 0.95 mg/dL (ref 0.61–1.24)
Chloride: 95 mmol/L — ABNORMAL LOW (ref 101–111)
Glucose, Bld: 104 mg/dL — ABNORMAL HIGH (ref 65–99)
Potassium: 3.9 mmol/L (ref 3.5–5.1)
SODIUM: 133 mmol/L — AB (ref 135–145)

## 2015-07-15 MED ORDER — LEVOFLOXACIN 500 MG PO TABS
500.0000 mg | ORAL_TABLET | Freq: Once | ORAL | Status: AC
  Administered 2015-07-15: 500 mg via ORAL
  Filled 2015-07-15: qty 1

## 2015-07-15 MED ORDER — IPRATROPIUM-ALBUTEROL 0.5-2.5 (3) MG/3ML IN SOLN
3.0000 mL | Freq: Once | RESPIRATORY_TRACT | Status: AC
  Administered 2015-07-15: 3 mL via RESPIRATORY_TRACT
  Filled 2015-07-15: qty 3

## 2015-07-15 MED ORDER — METHYLPREDNISOLONE SODIUM SUCC 125 MG IJ SOLR: 125.0000 mg | Freq: Once | INTRAMUSCULAR | Status: DC

## 2015-07-15 MED ORDER — PREDNISONE 20 MG PO TABS: 20.0000 mg | ORAL_TABLET | Freq: Two times a day (BID) | ORAL | Status: DC

## 2015-07-15 MED ORDER — LEVOFLOXACIN 500 MG PO TABS: 500.0000 mg | ORAL_TABLET | Freq: Every day | ORAL | Status: DC

## 2015-07-15 NOTE — ED Notes (Addendum)
Pt c/o increasing sob since 0100 this am. Productive cough-yellow sputum. Pt recently admitted with bilateral pneumonia x 2 weeks ago.  Pt received 2 albuterol neb tx and 125mg  iv solumedrol in route by EMS.

## 2015-07-15 NOTE — Discharge Instructions (Signed)
Chest x-ray is improving. Prescription for antibiotic and prednisone. Follow-up your primary care doctor or pulmonologist.

## 2015-07-15 NOTE — ED Provider Notes (Signed)
CSN: [id number]     Arrival date & time 07/15/15  1336 History   First MD Initiated Contact with Patient 07/15/15 1345     Chief Complaint  Patient presents with  . Shortness of Breath     (Consider location/radiation/quality/duration/timing/severity/associated sxs/prior Treatment) HPI..... Patient with known COPD and recent diagnosis of pneumonia presents with increasing cough, wheezing, dyspnea. He has been using his wife's albuterol nebulizer medication at home.  He quit smoking several years ago. No fever, sweats, chills, rusty sputum. Severity symptoms moderate. Exertion makes symptoms worse. He is on home oxygen.  Past Medical History  Diagnosis Date  . 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 History of tobacco abuse     80 pack year history   . Dyslipidemia    Past Surgical History  Procedure Laterality Date  . Cardiac catheterization  08/10/2008    right & left - normal coronaries (Dr. Claudia DesanctisH. Solomon)  . Transthoracic echocardiogram  07/2008    EF=>55%; RV mildly dilated; RA mild-mod dilated; mild mitral annular calcification & mild MR; AV mildly sclerotic  . Nm myocar perf wall motion  07/2008    bruce myoview - mild ischemia in basal inferoseptal & mid inferoseptal regions; EF 63%   Family History  Problem Relation Age of Onset  . Leukemia Mother   . Suicidality Father    Social History  Substance Use Topics  . Smoking status: Former Smoker    Quit date: 06/22/2007  . Smokeless tobacco: Never Used  . Alcohol Use: No    Review of Systems  All other systems reviewed and are negative.     Allergies  Ativan  Home Medications   Prior to Admission medications   Medication Sig Start Date End Date Taking? Authorizing Provider  albuterol (PROVENTIL HFA;VENTOLIN HFA) 108 (90 BASE) MCG/ACT inhaler Inhale 2 puffs into the lungs every 6 (six) hours as needed for wheezing or shortness of breath.   Yes Historical Provider, MD  albuterol  (PROVENTIL) 2 MG tablet Take 2 mg by mouth 2 (two) times daily.   Yes Historical Provider, MD  Aromatic Inhalants (VICKS VAPOR INHALER IN) Place 1 puff into both nostrils daily as needed (congestion).   Yes Historical Provider, MD  aspirin EC 81 MG tablet Take 81 mg by mouth daily.   Yes Historical Provider, MD  diazepam (VALIUM) 10 MG tablet Take 10 mg by mouth 3 (three) times daily. Reported on 07/15/2015   Yes Historical Provider, MD  magnesium oxide (MAG-OX) 400 MG tablet Take 400 mg by mouth daily.   Yes Historical Provider, MD  omeprazole (PRILOSEC) 40 MG capsule Take 40 mg by mouth 2 (two) times daily as needed (heartburn).  01/10/13  Yes Historical Provider, MD  oxyCODONE-acetaminophen (PERCOCET) 7.5-325 MG per tablet Take 1 tablet by mouth every 4 (four) hours as needed for pain.   Yes Historical Provider, MD  Propylene Glycol (SYSTANE BALANCE) 0.6 % SOLN Place 1 drop into both eyes 2 (two) times daily as needed (dry eyes).   Yes Historical Provider, MD  simvastatin (ZOCOR) 20 MG tablet TAKE 1 TABLET BY MOUTH DAILY AT BEDTIME. 04/24/15  Yes Chrystie NoseKenneth C Hilty, MD  SPIRIVA HANDIHALER 18 MCG inhalation capsule Place 1 capsule into inhaler and inhale daily. 07/11/15  Yes Historical Provider, MD  azithromycin (ZITHROMAX) 500 MG tablet Take 1 tablet (500 mg total) by mouth daily. Start 3/18 in AM Patient not taking: Reported on 07/15/2015 07/06/15   Melton Alaraniel P  Irene Limbo, MD  cefUROXime (CEFTIN) 500 MG tablet Take 1 tablet (500 mg total) by mouth 2 (two) times daily with a meal. Patient not taking: Reported on 07/15/2015 07/06/15   Standley Brooking, MD  levofloxacin (LEVAQUIN) 500 MG tablet Take 1 tablet (500 mg total) by mouth daily. 07/15/15   Donnetta Hutching, MD  predniSONE (DELTASONE) 20 MG tablet Take 1 tablet (20 mg total) by mouth 2 (two) times daily with a meal. 07/15/15   Donnetta Hutching, MD   BP 127/89 mmHg  Pulse 110  Temp(Src) 98.3 F (36.8 C) (Oral)  Resp 16  Ht  (1.753 m)  Wt 192 lb (87.091 kg)   BMI 28.34 kg/m2  SpO2 96% Physical Exam  Constitutional: He is oriented to person, place, and time.  Alert, not moving air well  HENT:  Head: Normocephalic and atraumatic.  Eyes: Conjunctivae and EOM are normal. Pupils are equal, round, and reactive to light.  Neck: Normal range of motion. Neck supple.  Cardiovascular: Normal rate and regular rhythm.   Pulmonary/Chest: Effort normal.  Bilateral expiratory wheeze  Abdominal: Soft. Bowel sounds are normal.  Musculoskeletal: Normal range of motion.  Neurological: He is alert and oriented to person, place, and time.  Skin: Skin is warm and dry.  Psychiatric: He has a normal mood and affect. His behavior is normal.  Nursing note and vitals reviewed.   ED Course  Procedures (including critical care time) Labs Review Labs Reviewed  CBC WITH DIFFERENTIAL/PLATELET - Abnormal; Notable for the following:    Monocytes Absolute 1.2 (*)    All other components within normal limits  BASIC METABOLIC PANEL - Abnormal; Notable for the following:    Sodium 133 (*)    Chloride 95 (*)    Glucose, Bld 104 (*)    Calcium 7.9 (*)    All other components within normal limits    Imaging Review Dg Chest 2 View  07/15/2015  CLINICAL DATA:  Shortness of breath, cough EXAM: CHEST  2 VIEW COMPARISON:  07/03/2015 FINDINGS: Chronic interstitial markings. No focal consolidation. Prior right upper and lower lobe pneumonia has essentially resolved. Prominent epicardial fat along the left hemidiaphragm. Azygos fissure. No pleural effusion or pneumothorax. The heart is normal in size. IMPRESSION: No evidence of acute cardiopulmonary disease. Prior right upper and lower lobe pneumonia has essentially resolved. Electronically Signed   By: Charline Bills M.D.   On: 07/15/2015 15:31   I have personally reviewed and evaluated these images and lab results as part of my medical decision-making.   EKG Interpretation   Date/Time:  Sunday July 15 2015 13:36:40  EDT Ventricular Rate:  104 PR Interval:  144 QRS Duration: 101 QT Interval:  335 QTC Calculation: 441 R Axis:   -5 Text Interpretation:  Sinus tachycardia Multiple ventricular premature  complexes Anteroseptal infarct, age indeterminate Confirmed by Sander Speckman  MD,  Thatcher Doberstein ([id number]) on 07/15/2015 1:51:25 PM      MDM   Final diagnoses:  COPD exacerbation (HCC)    Patient feels better after IV steroids and 2 nebulizer treatments. Chest x-ray shows no obvious pneumonia. His previous right upper and lower lobe pneumonia has resolved. We'll continue antibiotics and prednisone for several more days.    Donnetta Hutching, MD 07/15/15 [phone]

## 2015-07-17 ENCOUNTER — Ambulatory Visit: Payer: Medicare Other | Admitting: Internal Medicine

## 2015-07-19 ENCOUNTER — Encounter: Payer: Self-pay | Admitting: *Deleted

## 2015-07-23 ENCOUNTER — Telehealth: Payer: Self-pay | Admitting: Internal Medicine

## 2015-07-23 MED ORDER — SIMVASTATIN 20 MG PO TABS: 20.0000 mg | ORAL_TABLET | Freq: Every day | ORAL | Status: DC

## 2015-07-23 NOTE — Telephone Encounter (Signed)
Rx(s) sent to pharmacy electronically.  

## 2015-07-23 NOTE — Telephone Encounter (Signed)
Pt [facility] [place]

## 2015-07-26 DIAGNOSIS — Z1389 Encounter for screening for other disorder: Secondary | ICD-10-CM | POA: Diagnosis not present

## 2015-07-26 DIAGNOSIS — E782 Mixed hyperlipidemia: Secondary | ICD-10-CM | POA: Diagnosis not present

## 2015-07-26 DIAGNOSIS — J189 Pneumonia, unspecified organism: Secondary | ICD-10-CM | POA: Diagnosis not present

## 2015-07-26 DIAGNOSIS — J449 Chronic obstructive pulmonary disease, unspecified: Secondary | ICD-10-CM | POA: Diagnosis not present

## 2015-07-26 DIAGNOSIS — Z6826 Body mass index (BMI) 26.0-26.9, adult: Secondary | ICD-10-CM | POA: Diagnosis not present

## 2015-08-20 ENCOUNTER — Ambulatory Visit (INDEPENDENT_AMBULATORY_CARE_PROVIDER_SITE_OTHER): Payer: Medicare Other | Admitting: Cardiovascular Disease

## 2015-08-20 ENCOUNTER — Encounter: Payer: Self-pay | Admitting: Cardiovascular Disease

## 2015-08-20 VITALS — BP 124/90 | HR 101 | Ht 69.0 in | Wt 188.0 lb

## 2015-08-20 DIAGNOSIS — J438 Other emphysema: Secondary | ICD-10-CM | POA: Diagnosis not present

## 2015-08-20 DIAGNOSIS — E785 Hyperlipidemia, unspecified: Secondary | ICD-10-CM | POA: Diagnosis not present

## 2015-08-20 DIAGNOSIS — Z87898 Personal history of other specified conditions: Secondary | ICD-10-CM

## 2015-08-20 DIAGNOSIS — R079 Chest pain, unspecified: Secondary | ICD-10-CM

## 2015-08-20 DIAGNOSIS — Z9289 Personal history of other medical treatment: Secondary | ICD-10-CM

## 2015-08-20 NOTE — Patient Instructions (Signed)
Your physician recommends that you schedule a follow-up appointment in: as needed     Your physician recommends that you continue on your current medications as directed. Please refer to the Current Medication list given to you today.   Thank you for choosing [facility] !         

## 2015-08-20 NOTE — Progress Notes (Addendum)
Patient ID: [name], male   DOB: 1950/06/19, 65 y.o.   MRN: [id number]      SUBJECTIVE: The patient presents for annual follow-up. He saw Dr. Rennis Golden last year for shortness of breath. He has COPD and was recently hospitalized for pneumonia and sepsis. He has no significant coronary artery disease. He underwent a low risk nuclear stress test on 10/19/13, LVEF 68%, with normal wall motion. There were no large ischemic stones detected.  ECG performed last month which I personally interpreted demonstrates sinus tachycardia with PVCs.  Denies chest pain. Quit smoking in 2009. Chronic exertional dyspnea is stable. Using 2L oxygen continuously. Denies leg swelling.  Review of Systems: As per "subjective", otherwise negative.  Allergies  Allergen Reactions  . Ativan [Lorazepam] Other (See Comments)    Dry mouth sinus and breathing problems     Current Outpatient Prescriptions  Medication Sig Dispense Refill  . albuterol (PROVENTIL HFA;VENTOLIN HFA) 108 (90 BASE) MCG/ACT inhaler Inhale 2 puffs into the lungs every 6 (six) hours as needed for wheezing or shortness of breath.    Marland Kitchen albuterol (PROVENTIL) 2 MG tablet Take 2 mg by mouth 2 (two) times daily.    . Aromatic Inhalants (VICKS VAPOR INHALER IN) Place 1 puff into both nostrils daily as needed (congestion).    Marland Kitchen aspirin EC 81 MG tablet Take 81 mg by mouth daily.    . diazepam (VALIUM) 10 MG tablet Take 10 mg by mouth 3 (three) times daily. Reported on 07/15/2015    . magnesium oxide (MAG-OX) 400 MG tablet Take 400 mg by mouth daily.    Marland Kitchen omeprazole (PRILOSEC) 40 MG capsule Take 40 mg by mouth 2 (two) times daily as needed (heartburn).     Marland Kitchen oxyCODONE-acetaminophen (PERCOCET) 7.5-325 MG per tablet Take 1 tablet by mouth every 4 (four) hours as needed for pain.    Marland Kitchen Propylene Glycol (SYSTANE BALANCE) 0.6 % SOLN Place 1 drop into both eyes 2 (two) times daily as needed (dry eyes).    . simvastatin (ZOCOR) 20 MG tablet Take 1 tablet (20 mg  total) by mouth daily at 6 PM. 30 tablet 1   No current facility-administered medications for this visit.    Past Medical History  Diagnosis Date  . 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 History of tobacco abuse     80 pack year history   . Dyslipidemia     Past Surgical History  Procedure Laterality Date  . Cardiac catheterization  08/10/2008    right & left - normal coronaries (Dr. Claudia Desanctis)  . Transthoracic echocardiogram  07/2008    EF=>55%; RV mildly dilated; RA mild-mod dilated; mild mitral annular calcification & mild MR; AV mildly sclerotic  . Nm myocar perf wall motion  07/2008    bruce myoview - mild ischemia in basal inferoseptal & mid inferoseptal regions; EF 63%    Social History   Social History  . Marital Status: Married    Spouse Name: N/A  . Number of Children: 3  . Years of Education: 9   Occupational History  .     Social History Main Topics  . Smoking status: Former Smoker    Start date: 04/21/1974    Quit date: 06/22/2007  . Smokeless tobacco: Never Used  . Alcohol Use: No  . Drug Use: No  . Sexual Activity: Not on file   Other Topics Concern  . Not on file   Social History  Narrative     Filed Vitals:   08/20/15 1013  BP: 124/90  Pulse: 101  Height: 5\' 9"  (1.753 m)  Weight: 188 lb (85.276 kg)  SpO2: 95%    PHYSICAL EXAM General: NAD, using O2 by nasal cannula HEENT: Normal. Neck: No JVD, no thyromegaly. Lungs: Diminished throughout, no rales. CV: Nondisplaced PMI.  Regular rate and rhythm, normal S1/S2, no S3/S4, no murmur. No pretibial or periankle edema.    Abdomen: Soft, nontender, no distention.  Neurologic: Alert and oriented.  Psych: Normal affect. Skin: Normal. Musculoskeletal: No gross deformities.  ECG: Most recent ECG reviewed.      ASSESSMENT AND PLAN: 1. COPD: Stable. No changes.  2. Dyslipidemia: Continue statin.  3. SOB/chest pain: No chest pain. SOB is chronic and due to  COPD. Nuclear MPI study reviewed above (normal).  Dispo: fu prn.  Prentice DockerSuresh Mclean Moya, M.D., F.A.C.C.

## 2015-08-22 ENCOUNTER — Other Ambulatory Visit: Payer: Self-pay

## 2015-08-22 NOTE — Patient Outreach (Signed)
[facility] Care Management  08/22/2015  Edward NeighborsRoy L [name] Aug 29, 1950 [id number]   Unsuccessful attempt to reach patient referred for screening from the [employer] list.  HIPPA appropriate message left requesting call back.  RN will make another attempt within one week if no call back received.  Edward Deisonnie Babe Anthis, RN, MSN RN [facility] [employer] [phone] Fax [phone]

## 2015-08-28 ENCOUNTER — Ambulatory Visit: Payer: Self-pay

## 2015-08-29 ENCOUNTER — Other Ambulatory Visit: Payer: Self-pay

## 2015-08-29 NOTE — Patient Outreach (Signed)
[facility] Care Management  08/29/2015  [name] 01-13-1951 [id number]   Second unsuccessful attempt to reach patient referred from [employer] screening list.  HIPPA appropriate message left requesting call back.  If no response, RN will make another attempt to contact patient within one week.  Tyler Deisonnie Orenthal Debski, RN, MSN RN [employer] [phone] Fax [phone]

## 2015-08-30 DIAGNOSIS — E663 Overweight: Secondary | ICD-10-CM | POA: Diagnosis not present

## 2015-08-30 DIAGNOSIS — F419 Anxiety disorder, unspecified: Secondary | ICD-10-CM | POA: Diagnosis not present

## 2015-08-30 DIAGNOSIS — G894 Chronic pain syndrome: Secondary | ICD-10-CM | POA: Diagnosis not present

## 2015-08-30 DIAGNOSIS — Z6827 Body mass index (BMI) 27.0-27.9, adult: Secondary | ICD-10-CM | POA: Diagnosis not present

## 2015-08-30 DIAGNOSIS — Z79899 Other long term (current) drug therapy: Secondary | ICD-10-CM | POA: Diagnosis not present

## 2015-08-30 DIAGNOSIS — Z1389 Encounter for screening for other disorder: Secondary | ICD-10-CM | POA: Diagnosis not present

## 2015-09-03 ENCOUNTER — Other Ambulatory Visit: Payer: Self-pay

## 2015-09-03 NOTE — Patient Outreach (Signed)
[facility]) Care Management  09/03/2015  [name] 11-19-1950 [id number]   Third unsuccessful attempt to reach patient.  HIPPA appropriate message left requesting call back.  Plan:  RN will mail letter with pamphlet to patient.           If not response after 10 working days, RN will close case.  [name], RN, MSN RN [employer] [phone] Fax [phone]

## 2015-09-18 DIAGNOSIS — R52 Pain, unspecified: Secondary | ICD-10-CM | POA: Diagnosis not present

## 2015-09-18 DIAGNOSIS — Z6826 Body mass index (BMI) 26.0-26.9, adult: Secondary | ICD-10-CM | POA: Diagnosis not present

## 2015-09-21 ENCOUNTER — Other Ambulatory Visit: Payer: Self-pay | Admitting: Internal Medicine

## 2015-10-31 DIAGNOSIS — J441 Chronic obstructive pulmonary disease with (acute) exacerbation: Secondary | ICD-10-CM | POA: Diagnosis not present

## 2015-11-06 DIAGNOSIS — J441 Chronic obstructive pulmonary disease with (acute) exacerbation: Secondary | ICD-10-CM | POA: Diagnosis not present

## 2015-12-01 DIAGNOSIS — J441 Chronic obstructive pulmonary disease with (acute) exacerbation: Secondary | ICD-10-CM | POA: Diagnosis not present

## 2015-12-07 DIAGNOSIS — J441 Chronic obstructive pulmonary disease with (acute) exacerbation: Secondary | ICD-10-CM | POA: Diagnosis not present

## 2015-12-25 ENCOUNTER — Emergency Department (HOSPITAL_COMMUNITY): Payer: Commercial Managed Care - HMO

## 2015-12-25 ENCOUNTER — Encounter (HOSPITAL_COMMUNITY): Payer: Self-pay | Admitting: *Deleted

## 2015-12-25 ENCOUNTER — Inpatient Hospital Stay (HOSPITAL_COMMUNITY)
Admission: EM | Admit: 2015-12-25 | Discharge: 2015-12-26 | DRG: 190 | Disposition: A | Discharge status: Home / Self Care | Payer: Commercial Managed Care - HMO | Attending: Internal Medicine | Admitting: Internal Medicine

## 2015-12-25 DIAGNOSIS — J441 Chronic obstructive pulmonary disease with (acute) exacerbation: Secondary | ICD-10-CM | POA: Diagnosis present

## 2015-12-25 DIAGNOSIS — Z806 Family history of leukemia: Secondary | ICD-10-CM

## 2015-12-25 DIAGNOSIS — J439 Emphysema, unspecified: Secondary | ICD-10-CM | POA: Diagnosis not present

## 2015-12-25 DIAGNOSIS — R0602 Shortness of breath: Secondary | ICD-10-CM | POA: Diagnosis not present

## 2015-12-25 DIAGNOSIS — K219 Gastro-esophageal reflux disease without esophagitis: Secondary | ICD-10-CM | POA: Diagnosis present

## 2015-12-25 DIAGNOSIS — Z9981 Dependence on supplemental oxygen: Secondary | ICD-10-CM | POA: Diagnosis not present

## 2015-12-25 DIAGNOSIS — I10 Essential (primary) hypertension: Secondary | ICD-10-CM | POA: Diagnosis present

## 2015-12-25 DIAGNOSIS — R509 Fever, unspecified: Secondary | ICD-10-CM | POA: Diagnosis not present

## 2015-12-25 DIAGNOSIS — J189 Pneumonia, unspecified organism: Secondary | ICD-10-CM | POA: Diagnosis not present

## 2015-12-25 DIAGNOSIS — E785 Hyperlipidemia, unspecified: Secondary | ICD-10-CM | POA: Diagnosis not present

## 2015-12-25 DIAGNOSIS — J449 Chronic obstructive pulmonary disease, unspecified: Secondary | ICD-10-CM | POA: Diagnosis present

## 2015-12-25 DIAGNOSIS — J440 Chronic obstructive pulmonary disease with acute lower respiratory infection: Secondary | ICD-10-CM | POA: Diagnosis not present

## 2015-12-25 DIAGNOSIS — F419 Anxiety disorder, unspecified: Secondary | ICD-10-CM | POA: Diagnosis present

## 2015-12-25 DIAGNOSIS — I2510 Atherosclerotic heart disease of native coronary artery without angina pectoris: Secondary | ICD-10-CM | POA: Diagnosis not present

## 2015-12-25 DIAGNOSIS — Z7982 Long term (current) use of aspirin: Secondary | ICD-10-CM

## 2015-12-25 DIAGNOSIS — J9611 Chronic respiratory failure with hypoxia: Secondary | ICD-10-CM | POA: Diagnosis present

## 2015-12-25 DIAGNOSIS — Z87891 Personal history of nicotine dependence: Secondary | ICD-10-CM

## 2015-12-25 DIAGNOSIS — R110 Nausea: Secondary | ICD-10-CM | POA: Diagnosis not present

## 2015-12-25 DIAGNOSIS — R1111 Vomiting without nausea: Secondary | ICD-10-CM | POA: Diagnosis not present

## 2015-12-25 LAB — URINALYSIS, ROUTINE W REFLEX MICROSCOPIC
BILIRUBIN URINE: NEGATIVE
Glucose, UA: NEGATIVE mg/dL
HGB URINE DIPSTICK: NEGATIVE
Ketones, ur: NEGATIVE mg/dL
Leukocytes, UA: NEGATIVE
Nitrite: NEGATIVE
PH: 6 (ref 5.0–8.0)
Protein, ur: NEGATIVE mg/dL
SPECIFIC GRAVITY, URINE: 1.015 (ref 1.005–1.030)

## 2015-12-25 LAB — BASIC METABOLIC PANEL
Anion gap: 7 (ref 5–15)
BUN: 15 mg/dL (ref 6–20)
CHLORIDE: 105 mmol/L (ref 101–111)
CO2: 26 mmol/L (ref 22–32)
CREATININE: 1.13 mg/dL (ref 0.61–1.24)
Calcium: 8.1 mg/dL — ABNORMAL LOW (ref 8.9–10.3)
GFR calc Af Amer: >60 mL/min (ref >60)
GFR calc non Af Amer: >60 mL/min (ref >60)
Glucose, Bld: 129 mg/dL — ABNORMAL HIGH (ref 65–99)
POTASSIUM: 4.5 mmol/L (ref 3.5–5.1)
Sodium: 138 mmol/L (ref 135–145)

## 2015-12-25 LAB — CBC WITH DIFFERENTIAL/PLATELET
Basophils Absolute: 0 10*3/uL (ref 0.0–0.1)
Basophils Relative: 0 %
EOS ABS: 0 10*3/uL (ref 0.0–0.7)
Eosinophils Relative: 0 %
HEMATOCRIT: 42.8 % (ref 39.0–52.0)
HEMOGLOBIN: 14.3 g/dL (ref 13.0–17.0)
LYMPHS ABS: 0.8 10*3/uL (ref 0.7–4.0)
LYMPHS PCT: 8 %
MCH: 30.2 pg (ref 26.0–34.0)
MCHC: 33.4 g/dL (ref 30.0–36.0)
MCV: 90.5 fL (ref 78.0–100.0)
MONOS PCT: 6 %
Monocytes Absolute: 0.6 10*3/uL (ref 0.1–1.0)
NEUTROS ABS: 8.6 10*3/uL — AB (ref 1.7–7.7)
NEUTROS PCT: 86 %
Platelets: 209 10*3/uL (ref 150–400)
RBC: 4.73 MIL/uL (ref 4.22–5.81)
RDW: 13.4 % (ref 11.5–15.5)
WBC: 10 10*3/uL (ref 4.0–10.5)

## 2015-12-25 LAB — INFLUENZA PANEL BY PCR (TYPE A & B)
H1N1FLUPCR: NOT DETECTED
INFLBPCR: NEGATIVE
Influenza A By PCR: NEGATIVE

## 2015-12-25 LAB — STREP PNEUMONIAE URINARY ANTIGEN: STREP PNEUMO URINARY ANTIGEN: NEGATIVE

## 2015-12-25 LAB — I-STAT CG4 LACTIC ACID, ED
LACTIC ACID, VENOUS: 1.8 mmol/L (ref 0.5–1.9)
Lactic Acid, Venous: 1.17 mmol/L (ref 0.5–1.9)

## 2015-12-25 MED ORDER — ACETAMINOPHEN 500 MG PO TABS
1000.0000 mg | ORAL_TABLET | Freq: Once | ORAL | Status: AC
  Administered 2015-12-25: 1000 mg via ORAL
  Filled 2015-12-25: qty 2

## 2015-12-25 MED ORDER — DIAZEPAM 5 MG PO TABS
10.0000 mg | ORAL_TABLET | Freq: Three times a day (TID) | ORAL | Status: DC
  Administered 2015-12-25 – 2015-12-26 (×4): 10 mg via ORAL
  Filled 2015-12-25 (×4): qty 2

## 2015-12-25 MED ORDER — OXYCODONE-ACETAMINOPHEN 7.5-325 MG PO TABS
1.0000 | ORAL_TABLET | ORAL | Status: DC | PRN
  Administered 2015-12-25: 1 via ORAL
  Filled 2015-12-25: qty 1

## 2015-12-25 MED ORDER — ASPIRIN EC 81 MG PO TBEC
81.0000 mg | DELAYED_RELEASE_TABLET | Freq: Every day | ORAL | Status: DC
  Administered 2015-12-25 – 2015-12-26 (×2): 81 mg via ORAL
  Filled 2015-12-25 (×2): qty 1

## 2015-12-25 MED ORDER — ALBUTEROL SULFATE (2.5 MG/3ML) 0.083% IN NEBU: 5.0000 mg | INHALATION_SOLUTION | Freq: Once | RESPIRATORY_TRACT | Status: DC

## 2015-12-25 MED ORDER — AZITHROMYCIN 500 MG IV SOLR
500.0000 mg | INTRAVENOUS | Status: DC
  Administered 2015-12-26: 500 mg via INTRAVENOUS
  Filled 2015-12-25 (×2): qty 500

## 2015-12-25 MED ORDER — METHYLPREDNISOLONE SODIUM SUCC 125 MG IJ SOLR
40.0000 mg | Freq: Two times a day (BID) | INTRAMUSCULAR | Status: DC
  Administered 2015-12-25 – 2015-12-26 (×3): 40 mg via INTRAVENOUS
  Filled 2015-12-25 (×3): qty 2

## 2015-12-25 MED ORDER — ORAL CARE MOUTH RINSE
15.0000 mL | Freq: Two times a day (BID) | OROMUCOSAL | Status: DC
  Administered 2015-12-25 – 2015-12-26 (×3): 15 mL via OROMUCOSAL

## 2015-12-25 MED ORDER — SODIUM CHLORIDE 0.9 % IV BOLUS (SEPSIS)
1000.0000 mL | Freq: Once | INTRAVENOUS | Status: AC
  Administered 2015-12-25: 1000 mL via INTRAVENOUS

## 2015-12-25 MED ORDER — DEXTROSE 5 % IV SOLN
1.0000 g | Freq: Once | INTRAVENOUS | Status: AC
  Administered 2015-12-25: 1 g via INTRAVENOUS
  Filled 2015-12-25: qty 10

## 2015-12-25 MED ORDER — ACETAMINOPHEN 325 MG PO TABS: 650.0000 mg | ORAL_TABLET | Freq: Four times a day (QID) | ORAL | Status: DC | PRN

## 2015-12-25 MED ORDER — ALBUTEROL SULFATE 2 MG PO TABS
2.0000 mg | ORAL_TABLET | Freq: Two times a day (BID) | ORAL | Status: DC
  Administered 2015-12-25 – 2015-12-26 (×3): 2 mg via ORAL
  Filled 2015-12-25 (×7): qty 1

## 2015-12-25 MED ORDER — DULOXETINE HCL 20 MG PO CPEP
20.0000 mg | ORAL_CAPSULE | Freq: Every day | ORAL | Status: DC
  Administered 2015-12-25 – 2015-12-26 (×2): 20 mg via ORAL
  Filled 2015-12-25 (×4): qty 1

## 2015-12-25 MED ORDER — ALBUTEROL SULFATE (2.5 MG/3ML) 0.083% IN NEBU
2.5000 mg | INHALATION_SOLUTION | Freq: Once | RESPIRATORY_TRACT | Status: AC
  Administered 2015-12-25: 2.5 mg via RESPIRATORY_TRACT

## 2015-12-25 MED ORDER — METHYLPREDNISOLONE SODIUM SUCC 125 MG IJ SOLR
125.0000 mg | Freq: Once | INTRAMUSCULAR | Status: AC
  Administered 2015-12-25: 125 mg via INTRAVENOUS
  Filled 2015-12-25: qty 2

## 2015-12-25 MED ORDER — IPRATROPIUM BROMIDE 0.02 % IN SOLN: 0.5000 mg | Freq: Once | RESPIRATORY_TRACT | Status: DC

## 2015-12-25 MED ORDER — SODIUM CHLORIDE 0.9 % IV SOLN
INTRAVENOUS | Status: AC
  Administered 2015-12-25: 09:00:00 via INTRAVENOUS

## 2015-12-25 MED ORDER — ALBUTEROL SULFATE (2.5 MG/3ML) 0.083% IN NEBU: 2.5000 mg | INHALATION_SOLUTION | Freq: Four times a day (QID) | RESPIRATORY_TRACT | Status: DC | PRN

## 2015-12-25 MED ORDER — PANTOPRAZOLE SODIUM 40 MG PO TBEC
40.0000 mg | DELAYED_RELEASE_TABLET | Freq: Every day | ORAL | Status: DC
  Administered 2015-12-25 – 2015-12-26 (×2): 40 mg via ORAL
  Filled 2015-12-25 (×2): qty 1

## 2015-12-25 MED ORDER — IPRATROPIUM-ALBUTEROL 0.5-2.5 (3) MG/3ML IN SOLN
3.0000 mL | Freq: Once | RESPIRATORY_TRACT | Status: AC
  Administered 2015-12-25: 3 mL via RESPIRATORY_TRACT

## 2015-12-25 MED ORDER — ALBUTEROL SULFATE HFA 108 (90 BASE) MCG/ACT IN AERS: 2.0000 | INHALATION_SPRAY | Freq: Four times a day (QID) | RESPIRATORY_TRACT | Status: DC | PRN

## 2015-12-25 MED ORDER — DEXTROSE 5 % IV SOLN
500.0000 mg | Freq: Once | INTRAVENOUS | Status: AC
  Administered 2015-12-25: 500 mg via INTRAVENOUS
  Filled 2015-12-25: qty 500

## 2015-12-25 MED ORDER — DEXTROSE 5 % IV SOLN
1.0000 g | INTRAVENOUS | Status: DC
  Administered 2015-12-25: 1 g via INTRAVENOUS
  Filled 2015-12-25 (×2): qty 10

## 2015-12-25 NOTE — H&P (Signed)
History and Physical    [name]:[id number] DOB: 01/15/1951 DOA: 12/25/2015  PCP: Cassell Smiles., MD  Patient coming from:  home  Chief Complaint:   Fever, chills  HPI: [name] is a 65 y.o. male with medical history significant of COPD, CRF on 2 liters oxygen [place] at home, xsmoker, HTN comes in with the acute onset of chills, rigors and fever that woke him up tonight around midnight.  Pt has been doing well prior to this.  Denies any illnesses for the last couple of days.  Denies any coughing.  No n/v/d.  No rashes.  On arrival to the ED his temp was 104.  He has not had any sick contacts and denies any pain anywhere.  cxr shows right sided infiltrate.  Last time he was on antibiotics was about 6 months ago in March 2017 for bilateral pna.  Pt referred for admission for CAP.    Review of Systems: As per HPI otherwise 10 point review of systems negative.   Past Medical History:  Diagnosis Date  . 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 Dyslipidemia   . History of tobacco abuse    80 pack year history     Past Surgical History:  Procedure Laterality Date  . CARDIAC CATHETERIZATION  08/10/2008   right & left - normal coronaries (Dr. Claudia Desanctis)  . NM MYOCAR PERF WALL MOTION  07/2008   bruce myoview - mild ischemia in basal inferoseptal & mid inferoseptal regions; EF 63%  . TRANSTHORACIC ECHOCARDIOGRAM  07/2008   EF=>55%; RV mildly dilated; RA mild-mod dilated; mild mitral annular calcification & mild MR; AV mildly sclerotic     reports that he quit smoking about 8 years ago. He started smoking about 41 years ago. He has never used smokeless tobacco. He reports that he does not drink alcohol or use drugs.  Allergies  Allergen Reactions  . Ativan [Lorazepam] Other (See Comments)    Dry mouth sinus and breathing problems     Family History  Problem Relation Age of Onset  . Leukemia Mother   . Suicidality Father     Prior to Admission  medications   Medication Sig Start Date End Date Taking? Authorizing Provider  albuterol (PROVENTIL HFA;VENTOLIN HFA) 108 (90 BASE) MCG/ACT inhaler Inhale 2 puffs into the lungs every 6 (six) hours as needed for wheezing or shortness of breath.    Historical Provider, MD  albuterol (PROVENTIL) 2 MG tablet Take 2 mg by mouth 2 (two) times daily.    Historical Provider, MD  Aromatic Inhalants (VICKS VAPOR INHALER IN) Place 1 puff into both nostrils daily as needed (congestion).    Historical Provider, MD  aspirin EC 81 MG tablet Take 81 mg by mouth daily.    Historical Provider, MD  diazepam (VALIUM) 10 MG tablet Take 10 mg by mouth 3 (three) times daily. Reported on 07/15/2015    Historical Provider, MD  magnesium oxide (MAG-OX) 400 MG tablet Take 400 mg by mouth daily.    Historical Provider, MD  omeprazole (PRILOSEC) 40 MG capsule Take 40 mg by mouth 2 (two) times daily as needed (heartburn).  01/10/13   Historical Provider, MD  oxyCODONE-acetaminophen (PERCOCET) 7.5-325 MG per tablet Take 1 tablet by mouth every 4 (four) hours as needed for pain.    Historical Provider, MD  Propylene Glycol (SYSTANE BALANCE) 0.6 % SOLN Place 1 drop into both eyes 2 (two) times daily as needed (  dry eyes).    Historical Provider, MD  simvastatin (ZOCOR) 20 MG tablet TAKE 1 TABLET BY MOUTH DAILY AT 6 PM. 09/21/15   Laqueta LindenSuresh A Koneswaran, MD    Physical Exam: Vitals:   12/25/15 0515 12/25/15 0530 12/25/15 0545 12/25/15 0603  BP:  114/74    Pulse: (!) 135 (!) 139 (!) 131   Resp: 16 17 17    Temp:    102 F (38.9 C)  TempSrc:    Rectal  SpO2: 96% 95% 96%     Constitutional: NAD, calm, comfortable Vitals:   12/25/15 0515 12/25/15 0530 12/25/15 0545 12/25/15 0603  BP:  114/74    Pulse: (!) 135 (!) 139 (!) 131   Resp: 16 17 17    Temp:    102 F (38.9 C)  TempSrc:    Rectal  SpO2: 96% 95% 96%    Eyes: PERRL, lids and conjunctivae normal ENMT: Mucous membranes are moist. Posterior pharynx clear of any exudate  or lesions.Normal dentition.  Neck: normal, supple, no masses, no thyromegaly Respiratory: clear to auscultation bilaterally, no wheezing, no crackles. Normal respiratory effort. No accessory muscle use.  Cardiovascular: Regular rhythm but tachcardic, no murmurs / rubs / gallops. No extremity edema. 2+ pedal pulses. No carotid bruits.  Abdomen: no tenderness, no masses palpated. No hepatosplenomegaly. Bowel sounds positive.  Musculoskeletal: no clubbing / cyanosis. No joint deformity upper and lower extremities. Good ROM, no contractures. Normal muscle tone.  Skin: no rashes, lesions, ulcers. No induration Neurologic: CN 2-12 grossly intact. Sensation intact, DTR normal. Strength 5/5 in all 4.  Psychiatric: Normal judgment and insight. Alert and oriented x 3. Normal mood.    Labs on Admission: I have personally reviewed following labs and imaging studies  CBC:  Recent Labs Lab 12/25/15 0429  WBC 10.0  NEUTROABS 8.6*  HGB 14.3  HCT 42.8  MCV 90.5  PLT 209   Basic Metabolic Panel:  Recent Labs Lab 12/25/15 0429  NA 138  K 4.5  CL 105  CO2 26  GLUCOSE 129*  BUN 15  CREATININE 1.13  CALCIUM 8.1*   GFR: CrCl cannot be calculated (Unknown ideal weight.).  Urine analysis:    Component Value Date/Time   COLORURINE YELLOW 12/25/2015 0405   APPEARANCEUR CLEAR 12/25/2015 0405   LABSPEC 1.015 12/25/2015 0405   PHURINE 6.0 12/25/2015 0405   GLUCOSEU NEGATIVE 12/25/2015 0405   HGBUR NEGATIVE 12/25/2015 0405   BILIRUBINUR NEGATIVE 12/25/2015 0405   KETONESUR NEGATIVE 12/25/2015 0405   PROTEINUR NEGATIVE 12/25/2015 0405   NITRITE NEGATIVE 12/25/2015 0405   LEUKOCYTESUR NEGATIVE 12/25/2015 0405    Recent Results (from the past 240 hour(s))  Blood culture (routine x 2)     Status: None (Preliminary result)   Collection Time: 12/25/15  4:30 AM  Result Value Ref Range Status   Specimen Description BLOOD RIGHT ANTECUBITAL  Final   Special Requests   Final    BOTTLES DRAWN  AEROBIC AND ANAEROBIC AEB 10CC ANA 8CC   Culture PENDING  Incomplete   Report Status PENDING  Incomplete  Blood culture (routine x 2)     Status: None (Preliminary result)   Collection Time: 12/25/15  4:48 AM  Result Value Ref Range Status   Specimen Description BLOOD RIGHT ANTECUBITAL  Final   Special Requests BOTTLES DRAWN AEROBIC AND ANAEROBIC 10CC EACH  Final   Culture PENDING  Incomplete   Report Status PENDING  Incomplete     [facility]iological Ex[facility] on Ad[facility]  Result Date: 12/25/2015 CLINICAL DATA:  Acute onset of shortness of breath, fever, body aches and chills. Wheezing and vomiting. Decreased O2 saturation. Initial encounter. EXAM: PORTABLE CHEST 1 VIEW COMPARISON:  Chest radiograph performed 07/15/2015 FINDINGS: The lungs are well-aerated. Diffuse right-sided airspace opacification is concerning for pneumonia. There is no evidence of pleural effusion or pneumothorax. An accessory azygos lobe is incidentally noted. The cardiomediastinal silhouette is borderline normal in size. No acute osseous abnormalities are seen. IMPRESSION: Diffuse right-sided airspace opacification is concerning for pneumonia. Accessory azygos lobe incidentally noted. Electronically Signed   By: Roanna Raider M.D.   On: 12/25/2015 05:45    EKG: Independently reviewed. Sinus tachycardia  Assessment/Plan 65 yo male with CRF, copd comes in with fever, chills, rigors due to CAP  Principal Problem:   PNA (pneumonia)- place on pna pathway, obtain blood and sputum cultures.  Place on iv rocephin and azithro.  Pt given over a liter of ivf and 1 gm of tylenol about an hour ago, still tachycardic and febrile but appears nontoxic.  Lactic acid level is 2.1 just above normal level.  He is in no respiratory distress and his oxygen sats are good on his 2 liters.  Provide tylenol for fever prn.  Can go to telemetry floor.  Tachycardia should improve with resolution of his high fever with tylenol.  Check  quick flu.  Active Problems:   Chronic respiratory failure with hypoxia (HCC)- stable, continue on his 2 liters   COPD (chronic obstructive pulmonary disease) (HCC)- stable, no wheezing, cont alb nebs, no steroids at this time   Coronary artery disease- stable    DVT prophylaxis:  Scds, ambulate  Code Status:  full Family Communication:  Family at bedside Disposition Plan:  Per day team Consults called:  none Admission status:  admit    Anayely Constantine A MD [facility]  If 7PM-7AM, please contact night-coverage [email or web address] Password [email or web address]  12/25/2015, 6:07 AM

## 2015-12-25 NOTE — Progress Notes (Signed)
  PROGRESS NOTE  Fabio NeighborsRoy L Hagberg ZOX:[id number] DOB: 1951-01-03 DOA: 12/25/2015 PCP: Cassell SmilesFUSCO,[name], MD  Brief Narrative: 65 year-old male with a PMH of COPD, HTN, HLD, GERD, anxiety, and tobacco use disorder presented with complaints of fever and chills. He was admitted for community acquired pneumonia.   Assessment/Plan: 1. CAP. Appears clinically stable. 2. Chronic respiratory failure with hypoxia. Secondary to COPD. appears stable. 3. COPD. appears stable. 4. GERD. Continue Protonix.  5. Coronary artery disease.  6. Anxiety. Noted.  7. Tobacco use disorder.    Appears quite stable. Will continue antibiotics and supportive care. Start steroids.  Follow-up cultures  DVT prophylaxis: SCDs  Code Status: Full Family Communication: No family bedside Disposition Plan: Anticipate discharge in 48 hours.   Brendia Sacksaniel Goodrich, MD  [facility] Direct contact: [phone] --Via amion app OR  --[email or web address]; password [email or web address]  7PM-7AM contact night coverage as above 12/25/2015, 6:15 AM  LOS: 0 days   Consultants:  None   Procedures:  None   Antimicrobials:  Azithromycin 9/5 >>  Rocephin 9/5 >>  HPI/Subjective: Feels better. Mild cough with yellow sputum. Eating and breathing well. Denies any pain.   Objective: Vitals:   12/25/15 0530 12/25/15 0545 12/25/15 0603 12/25/15 0603  BP: 114/74  108/72   Pulse: (!) 139 (!) 131 (!) 122   Resp: 17 17 20    Temp:    102 F (38.9 C)  TempSrc:    Rectal  SpO2: 95% 96% 95%    No intake or output data in the 24 hours ending 12/25/15 0615   There were no vitals filed for this visit.  Exam:    Constitutional:  . Appears calm and comfortable Respiratory:  . CTA bilaterally, no w/r/r.  . Respiratory effort normal. No retractions or accessory muscle use Cardiovascular:  . RRR, no m/r/g . No LE extremity edema     I have personally reviewed following labs and imaging studies:  Flu test negative   CXR revealed diffuse  right-sided airspace opacification   Scheduled Meds: Continuous Infusions:  Principal Problem:   PNA (pneumonia) Active Problems:   COPD (chronic obstructive pulmonary disease) (HCC)   Coronary artery disease   Chronic respiratory failure with hypoxia (HCC)   LOS: 0 days   Time spent 20 minutes    By signing my name below, I, Cynda AcresHailei Fulton, attest that this documentation has been prepared under the direction and in the presence of Brendia Sacksaniel Goodrich, MD. Electronically signed: Cynda AcresHailei Fulton, Scribe. 12/25/15 1:05 PM   I personally performed the services described in this documentation. All medical record entries made by the scribe were at my direction. I have reviewed the chart and agree that the record reflects my personal performance and is accurate and complete. Brendia Sacksaniel Goodrich, MD

## 2015-12-25 NOTE — ED Provider Notes (Signed)
[facility]-EMERGENCY DEPT Provider Note   CSN: [id number] Arrival date & time: 12/25/15  0357  Time seen 04:13 AM   History   Chief Complaint Chief Complaint  Patient presents with  . Fever    HPI [name] is a 65 y.o. male.  HPI patient reports he was fine all day today however about 12:30 AM he woke up having chills and fever up to 102. He complains of diffuse body aches. He has had a cough without coughing up mucus however he does report posttussive vomiting. He denies chest pain or abdominal pain. He denies nausea or vomiting without coughing first, or diarrhea. He states he feels short of breath at times.  Patient is on oxygen at home and uses 2 L/m nasal cannula. He was last admitted in March when he had pneumonia. He states this feels similar. He states he has a nebulizer at home and he also uses a pro-air inhaler   PCP Dr. Sherwood Gambler  Past Medical History:  Diagnosis Date  . 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 Dyslipidemia   . History of tobacco abuse    80 pack year history     Patient Active Problem List   Diagnosis Date Noted  . PNA (pneumonia) 12/25/2015  . COPD exacerbation (HCC)   . Sepsis (HCC) 07/03/2015  . CAP (community acquired pneumonia) 07/03/2015  . Acute respiratory failure with hypoxia (HCC) 07/03/2015  . GERD (gastroesophageal reflux disease) 07/03/2015  . Coronary artery disease 10/13/2013  . Anxiety 10/13/2013  . Dyslipidemia 06/23/2013  . COPD (chronic obstructive pulmonary disease) (HCC) 06/23/2013  . SHOULDER PAIN 08/16/2008  . SHOULDER IMPINGEMENT SYNDROME, LEFT 08/16/2008    Past Surgical History:  Procedure Laterality Date  . CARDIAC CATHETERIZATION  08/10/2008   right & left - normal coronaries (Dr. Claudia Desanctis)  . NM MYOCAR PERF WALL MOTION  07/2008   bruce myoview - mild ischemia in basal inferoseptal & mid inferoseptal regions; EF 63%  . TRANSTHORACIC ECHOCARDIOGRAM  07/2008   EF=>55%; RV mildly dilated;  RA mild-mod dilated; mild mitral annular calcification & mild MR; AV mildly sclerotic       Home Medications    Prior to Admission medications   Medication Sig Start Date End Date Taking? Authorizing Provider  albuterol (PROVENTIL HFA;VENTOLIN HFA) 108 (90 BASE) MCG/ACT inhaler Inhale 2 puffs into the lungs every 6 (six) hours as needed for wheezing or shortness of breath.    Historical Provider, MD  albuterol (PROVENTIL) 2 MG tablet Take 2 mg by mouth 2 (two) times daily.    Historical Provider, MD  Aromatic Inhalants (VICKS VAPOR INHALER IN) Place 1 puff into both nostrils daily as needed (congestion).    Historical Provider, MD  aspirin EC 81 MG tablet Take 81 mg by mouth daily.    Historical Provider, MD  diazepam (VALIUM) 10 MG tablet Take 10 mg by mouth 3 (three) times daily. Reported on 07/15/2015    Historical Provider, MD  magnesium oxide (MAG-OX) 400 MG tablet Take 400 mg by mouth daily.    Historical Provider, MD  omeprazole (PRILOSEC) 40 MG capsule Take 40 mg by mouth 2 (two) times daily as needed (heartburn).  01/10/13   Historical Provider, MD  oxyCODONE-acetaminophen (PERCOCET) 7.5-325 MG per tablet Take 1 tablet by mouth every 4 (four) hours as needed for pain.    Historical Provider, MD  Propylene Glycol (SYSTANE BALANCE) 0.6 % SOLN Place 1 drop into both eyes 2 (two)  times daily as needed (dry eyes).    Historical Provider, MD  simvastatin (ZOCOR) 20 MG tablet TAKE 1 TABLET BY MOUTH DAILY AT 6 PM. 09/21/15   Laqueta Linden, MD    Family History Family History  Problem Relation Age of Onset  . Leukemia Mother   . Suicidality Father     Social History Social History  Substance Use Topics  . Smoking status: Former Smoker    Start date: 04/21/1974    Quit date: 06/22/2007  . Smokeless tobacco: Never Used  . Alcohol use No  lives at home Lives with spouse Uses oxygen 2 lpm [place]   Allergies   Ativan [lorazepam]   Review of Systems Review of Systems  All other  systems reviewed and are negative.    Physical Exam Updated Vital Signs BP (!) 142/101   Pulse (!) 141   Temp (!) 104.2 F (40.1 C) (Rectal)   Resp 23   SpO2 93%   Vital signs normal except for tachycardia and fever   Physical Exam  Constitutional: He is oriented to person, place, and time. He appears well-developed and well-nourished.  Non-toxic appearance. He does not appear ill. No distress.  HENT:  Head: Normocephalic and atraumatic.  Right Ear: External ear normal.  Left Ear: External ear normal.  Nose: Nose normal. No mucosal edema or rhinorrhea.  Mouth/Throat: Oropharynx is clear and moist and mucous membranes are normal. No dental abscesses or uvula swelling.  Eyes: Conjunctivae and EOM are normal. Pupils are equal, round, and reactive to light.  Neck: Normal range of motion and full passive range of motion without pain. Neck supple.  Cardiovascular: Normal rate, regular rhythm and normal heart sounds.  Exam reveals no gallop and no friction rub.   No murmur heard. Pulmonary/Chest: Effort normal. No respiratory distress. He has decreased breath sounds. He has wheezes. He has no rhonchi. He has no rales. He exhibits no tenderness and no crepitus.  He is noted to have a few scattered wheezes  Abdominal: Soft. Normal appearance and bowel sounds are normal. He exhibits no distension. There is no tenderness. There is no rebound and no guarding.  Musculoskeletal: Normal range of motion. He exhibits no edema or tenderness.  Moves all extremities well.   Neurological: He is alert and oriented to person, place, and time. He has normal strength. No cranial nerve deficit.  Skin: Skin is warm, dry and intact. No rash noted. No erythema. No pallor.  Feels hot to touch  Psychiatric: He has a normal mood and affect. His speech is normal and behavior is normal. His mood appears not anxious.  Nursing note and vitals reviewed.    ED Treatments / Results  Labs (all labs ordered are  listed, but only abnormal results are displayed) Results for orders placed or performed during the hospital encounter of 12/25/15  Blood culture (routine x 2)  Result Value Ref Range   Specimen Description BLOOD RIGHT ANTECUBITAL    Special Requests      BOTTLES DRAWN AEROBIC AND ANAEROBIC AEB 10CC ANA 8CC   Culture PENDING    Report Status PENDING   Blood culture (routine x 2)  Result Value Ref Range   Specimen Description BLOOD RIGHT ANTECUBITAL    Special Requests BOTTLES DRAWN AEROBIC AND ANAEROBIC 10CC EACH    Culture PENDING    Report Status PENDING   CBC with Differential  Result Value Ref Range   WBC 10.0 4.0 - 10.5 K/uL   RBC 4.73  4.22 - 5.81 MIL/uL   Hemoglobin 14.3 13.0 - 17.0 g/dL   HCT 42.8 09.639.0 - 04.552.0 %   MCV 90.5 78.0 - 100.0 fL   MCH 30.2 26.0 - 34.0 pg   MCHC 33.4 30.0 - 36.0 g16.1/dL   RDW 40.913.4 81.111.5 - 91.415.5 %   Platelets 209 150 - 400 K/uL   Neutrophils Relative % 86 %   Neutro Abs 8.6 (H) 1.7 - 7.7 K/uL   Lymphocytes Relative 8 %   Lymphs Abs 0.8 0.7 - 4.0 K/uL   Monocytes Relative 6 %   Monocytes Absolute 0.6 0.1 - 1.0 K/uL   Eosinophils Relative 0 %   Eosinophils Absolute 0.0 0.0 - 0.7 K/uL   Basophils Relative 0 %   Basophils Absolute 0.0 0.0 - 0.1 K/uL  Basic metabolic panel  Result Value Ref Range   Sodium 138 135 - 145 mmol/L   Potassium 4.5 3.5 - 5.1 mmol/L   Chloride 105 101 - 111 mmol/L   CO2 26 22 - 32 mmol/L   Glucose, Bld 129 (H) 65 - 99 mg/dL   BUN 15 6 - 20 mg/dL   Creatinine, Ser 7.821.13 0.61 - 1.24 mg/dL   Calcium 8.1 (L) 8.9 - 10.3 mg/dL   GFR calc non Af Amer >60 >60 mL/min   GFR calc Af Amer >60 >60 mL/min   Anion gap 7 5 - 15  Urinalysis, Routine w reflex microscopic (not at [facility])  Result Value Ref Range   Color, Urine YELLOW YELLOW   APPearance CLEAR CLEAR   Specific Gravity, Urine 1.015 1.005 - 1.030   pH 6.0 5.0 - 8.0   Glucose, UA NEGATIVE NEGATIVE mg/dL   Hgb urine dipstick NEGATIVE NEGATIVE   Bilirubin Urine NEGATIVE  NEGATIVE   Ketones, ur NEGATIVE NEGATIVE mg/dL   Protein, ur NEGATIVE NEGATIVE mg/dL   Nitrite NEGATIVE NEGATIVE   Leukocytes, UA NEGATIVE NEGATIVE  I-Stat CG4 Lactic Acid, ED  Result Value Ref Range   Lactic Acid, Venous 1.80 0.5 - 1.9 mmol/L   Laboratory interpretation all normal     EKG  EKG Interpretation  Date/Time:  Tuesday December 25 2015 04:03:03 EDT Ventricular Rate:  143 PR Interval:    QRS Duration: 99 QT Interval:  275 QTC Calculation: 425 R Axis:   -83 Text Interpretation:  Sinus tachycardia Left anterior fascicular block Anterior infarct, age indeterminate Since last tracing rate faster 15 Jul 2015 Confirmed by Aspin Palomarez  MD-I, Teon Hudnall ([id number]) on 12/25/2015 4:46:35 AM       Radiology Dg Chest Port 1 View  Result Date: 12/25/2015 CLINICAL DATA:  Acute onset of shortness of breath, fever, body aches and chills. Wheezing and vomiting. Decreased O2 saturation. Initial encounter. EXAM: PORTABLE CHEST 1 VIEW COMPARISON:  Chest radiograph performed 07/15/2015 FINDINGS: The lungs are well-aerated. Diffuse right-sided airspace opacification is concerning for pneumonia. There is no evidence of pleural effusion or pneumothorax. An accessory azygos lobe is incidentally noted. The cardiomediastinal silhouette is borderline normal in size. No acute osseous abnormalities are seen. IMPRESSION: Diffuse right-sided airspace opacification is concerning for pneumonia. Accessory azygos lobe incidentally noted. Electronically Signed   By: Roanna RaiderJeffery  Chang M.D.   On: 12/25/2015 05:45    Procedures Procedures (including critical care time)  Medications Ordered in ED Medications  azithromycin (ZITHROMAX) 500 mg in dextrose 5 % 250 mL IVPB (500 mg Intravenous New Bag/Given 12/25/15 0515)  sodium chloride 0.9 % bolus 1,000 mL (not administered)  sodium chloride 0.9 % bolus 1,000 mL (  0 mLs Intravenous Stopped 12/25/15 0449)  methylPREDNISolone sodium succinate (SOLU-MEDROL) 125 mg/2 mL injection 125 mg  (125 mg Intravenous Given 12/25/15 0424)  ipratropium-albuterol (DUONEB) 0.5-2.5 (3) MG/3ML nebulizer solution 3 mL (3 mLs Nebulization Given 12/25/15 0443)  albuterol (PROVENTIL) (2.5 MG/3ML) 0.083% nebulizer solution 2.5 mg (2.5 mg Nebulization Given 12/25/15 0443)  acetaminophen (TYLENOL) tablet 1,000 mg (1,000 mg Oral Given 12/25/15 0434)  cefTRIAXone (ROCEPHIN) 1 g in dextrose 5 % 50 mL IVPB (0 g Intravenous Stopped 12/25/15 0514)  sodium chloride 0.9 % bolus 1,000 mL (0 mLs Intravenous Stopped 12/25/15 0516)     Initial Impression / Assessment and Plan / ED Course  I have reviewed the triage vital signs and the nursing notes.  Pertinent labs & imaging results that were available during my care of the patient were reviewed by me and considered in my medical decision making (see chart for details).  Clinical Course   After my exam patient was given albuterol/Atrovent nebulizer treatment. He was started on IV fluids. Rectal temperature showed his temperature was high at 104. He was given oral acetaminophen. He was started on antibiotics for community-acquired pneumonia since he is having coughing and the fever. He was last admitted to the hospital in March so he was started on the community-acquired antibiotics.  5 AM still haven't gotten his 2 view CXR, changed order to portable.   Rcheck at 05:30 AM I reviewed patient's chest x-ray with patient and his family. He has an obvious right upper and possibly right middle lobe pneumonia, still pending radiology review however it's markedly changed from his last chest x-ray in March. We discussed admission and he is agreeable.  05:43 AM Dr Onalee Hua, will see patient to determine what floor to admit him to.   05:55 AM Dr Onalee Hua states she will do the bed request.   Final Clinical Impressions(s) / ED Diagnoses   Final diagnoses:  CAP (community acquired pneumonia)  COPD exacerbation [facility])    Plan admission  Devoria Albe, MD, Concha Pyo,  MD 12/25/15 [phone]

## 2015-12-25 NOTE — ED Triage Notes (Signed)
Pt brought in by [facility] for c/o fever with generalized body aches and chills; pt has decreased O2 sats with mids 80's when ems arrived on scene;  Pt has some expiratory wheezes throughout and right lung sounds diminished; pt vomited en route to hospital and ems gave zofran 4mg  IV

## 2015-12-26 DIAGNOSIS — J9611 Chronic respiratory failure with hypoxia: Secondary | ICD-10-CM

## 2015-12-26 DIAGNOSIS — J189 Pneumonia, unspecified organism: Secondary | ICD-10-CM

## 2015-12-26 LAB — BASIC METABOLIC PANEL
Anion gap: 7 (ref 5–15)
BUN: 15 mg/dL (ref 6–20)
CHLORIDE: 103 mmol/L (ref 101–111)
CO2: 24 mmol/L (ref 22–32)
CREATININE: 0.92 mg/dL (ref 0.61–1.24)
Calcium: 8.9 mg/dL (ref 8.9–10.3)
GFR calc Af Amer: >60 mL/min (ref >60)
GFR calc non Af Amer: >60 mL/min (ref >60)
Glucose, Bld: 220 mg/dL — ABNORMAL HIGH (ref 65–99)
Potassium: 4.2 mmol/L (ref 3.5–5.1)
SODIUM: 134 mmol/L — AB (ref 135–145)

## 2015-12-26 LAB — CBC WITH DIFFERENTIAL/PLATELET
BASOS ABS: 0 10*3/uL (ref 0.0–0.1)
BASOS PCT: 0 %
EOS ABS: 0 10*3/uL (ref 0.0–0.7)
EOS PCT: 0 %
HEMATOCRIT: 38.3 % — AB (ref 39.0–52.0)
HEMOGLOBIN: 12.8 g/dL — AB (ref 13.0–17.0)
Lymphocytes Relative: 3 %
Lymphs Abs: 0.6 10*3/uL — ABNORMAL LOW (ref 0.7–4.0)
MCH: 30.8 pg (ref 26.0–34.0)
MCHC: 33.4 g/dL (ref 30.0–36.0)
MCV: 92.1 fL (ref 78.0–100.0)
Monocytes Absolute: 0.4 10*3/uL (ref 0.1–1.0)
Monocytes Relative: 2 %
Neutro Abs: 17.8 10*3/uL — ABNORMAL HIGH (ref 1.7–7.7)
Neutrophils Relative %: 95 %
Platelets: 208 10*3/uL (ref 150–400)
RBC: 4.16 MIL/uL — AB (ref 4.22–5.81)
RDW: 13.5 % (ref 11.5–15.5)
WBC: 18.8 10*3/uL — AB (ref 4.0–10.5)

## 2015-12-26 LAB — URINE CULTURE: CULTURE: NO GROWTH

## 2015-12-26 LAB — GLUCOSE, CAPILLARY
GLUCOSE-CAPILLARY: 158 mg/dL — AB (ref 65–99)
Glucose-Capillary: 152 mg/dL — ABNORMAL HIGH (ref 65–99)

## 2015-12-26 MED ORDER — DEXTROSE 5 % IV SOLN
INTRAVENOUS | Status: AC
  Filled 2015-12-26: qty 500

## 2015-12-26 MED ORDER — LEVOFLOXACIN 500 MG PO TABS: 500.0000 mg | ORAL_TABLET | Freq: Every day | ORAL | 0 refills | Status: DC

## 2015-12-26 NOTE — Care Management Important Message (Signed)
Important Message  Patient Details  Name: [name] MRN: [id number] Date of Birth: 09/03/1950   Medicare Important Message Given:  Yes    Augustus Zurawski, Chrystine OilerSharley Diane, RN 12/26/2015, 12:58 PM

## 2015-12-26 NOTE — Care Management Note (Signed)
Case Management Note  Patient Details  Name: [name] MRN: [id number] Date of Birth: May 25, 1950  Subjective/Objective: Patient is from home, ind with ADL's. Has home oxygen, has tank for transport home today. He has a PCP and insurance, reports no issues.                  Action/Plan: Anticipate DC home with self care. No CM needs.   Expected Discharge Date:                  Expected Discharge Plan:  Home/Self Care  In-House Referral:  NA  Discharge planning Services  CM Consult  Post Acute Care Choice:  NA Choice offered to:  NA  DME Arranged:    DME Agency:     HH Arranged:    HH Agency:     Status of Service:  Completed, signed off  If discussed at [employer] Length of Stay Meetings, dates discussed:    Additional Comments:  Zaina Jenkin, Chrystine OilerSharley Diane, RN 12/26/2015, 12:32 PM

## 2015-12-26 NOTE — Discharge Summary (Signed)
Physician Discharge Summary  [name]:[id number] DOB: December 18, 1950 DOA: 12/25/2015  PCP: Cassell Smiles., MD  Admit date: 12/25/2015 Discharge date: 12/26/2015  Time spent: 45 minutes  Recommendations for Outpatient Follow-up:  -Will be discharged home today. -Advised to follow up with PCP in 2 weeks.   Discharge Diagnoses:  Principal Problem:   PNA (pneumonia) Active Problems:   COPD (chronic obstructive pulmonary disease) (HCC)   Coronary artery disease   Chronic respiratory failure with hypoxia (HCC)   Discharge Condition: Stable and improved  There were no vitals filed for this visit.  History of present illness:  As per Dr. Onalee Hua on 9/5: [name] is a 65 y.o. male with medical history significant of COPD, CRF on 2 liters oxygen [place] at home, xsmoker, HTN comes in with the acute onset of chills, rigors and fever that woke him up tonight around midnight.  Pt has been doing well prior to this.  Denies any illnesses for the last couple of days.  Denies any coughing.  No n/v/d.  No rashes.  On arrival to the ED his temp was 104.  He has not had any sick contacts and denies any pain anywhere.  cxr shows right sided infiltrate.  Last time he was on antibiotics was about 6 months ago in March 2017 for bilateral pna.  Pt referred for admission for CAP.    Hospital Course:   CAP -Improving clinically, no fever, no SOB. -OK for DC home today on 7 days on levaquin.  Chronic Hypoxemic Resp Failure -Continue oxygen at 4 L (same as home).  Procedures:  None   Consultations:  None  Discharge Instructions  Discharge Instructions    Diet - low sodium heart healthy    Complete by:  As directed   Increase activity slowly    Complete by:  As directed       Medication List    TAKE these medications   albuterol (2.5 MG/3ML) 0.083% nebulizer solution Commonly known as:  PROVENTIL Take 2.5 mg by nebulization every 4 (four) hours as needed for wheezing or shortness  of breath. What changed:  Another medication with the same name was removed. Continue taking this medication, and follow the directions you see here.   albuterol 108 (90 Base) MCG/ACT inhaler Commonly known as:  PROVENTIL HFA;VENTOLIN HFA Inhale 2 puffs into the lungs every 6 (six) hours as needed for wheezing or shortness of breath. What changed:  Another medication with the same name was removed. Continue taking this medication, and follow the directions you see here.   diazepam 10 MG tablet Commonly known as:  VALIUM Take 10 mg by mouth 3 (three) times daily. Reported on 07/15/2015   DULoxetine 20 MG capsule Commonly known as:  CYMBALTA Take 20 mg by mouth daily.   levofloxacin 500 MG tablet Commonly known as:  LEVAQUIN Take 1 tablet (500 mg total) by mouth daily.   magnesium oxide 400 MG tablet Commonly known as:  MAG-OX Take 400 mg by mouth daily.   omeprazole 40 MG capsule Commonly known as:  PRILOSEC Take 40 mg by mouth daily.   oxyCODONE-acetaminophen 7.5-325 MG tablet Commonly known as:  PERCOCET Take 1 tablet by mouth every 4 (four) hours as needed for pain.   simvastatin 20 MG tablet Commonly known as:  ZOCOR TAKE 1 TABLET BY MOUTH DAILY AT 6 PM.   SYMBICORT 160-4.5 MCG/ACT inhaler Generic drug:  budesonide-formoterol Inhale 2 puffs into the lungs 2 (two) times  daily.   SYSTANE BALANCE 0.6 % Soln Generic drug:  Propylene Glycol Place 1 drop into both eyes 2 (two) times daily as needed (dry eyes).   VICKS VAPOR INHALER IN Place 1 puff into both nostrils daily as needed (congestion).      Allergies  Allergen Reactions  . Ativan [Lorazepam] Other (See Comments)    Dry mouth sinus and breathing problems    Follow-up Information    [name],[name], MD. Schedule an appointment as soon as possible for a visit in 2 week(s).   Specialty:  Internal Medicine Contact information: [address] [place] [address] [phone]             The results of significant diagnostics from this hospitalization (including imaging, microbiology, ancillary and laboratory) are listed below for reference.    Significant Diagnostic Studies: Dg Chest Port 1 View  Result Date: 12/25/2015 CLINICAL DATA:  Acute onset of shortness of breath, fever, body aches and chills. Wheezing and vomiting. Decreased O2 saturation. Initial encounter. EXAM: PORTABLE CHEST 1 VIEW COMPARISON:  Chest radiograph performed 07/15/2015 FINDINGS: The lungs are well-aerated. Diffuse right-sided airspace opacification is concerning for pneumonia. There is no evidence of pleural effusion or pneumothorax. An accessory azygos lobe is incidentally noted. The cardiomediastinal silhouette is borderline normal in size. No acute osseous abnormalities are seen. IMPRESSION: Diffuse right-sided airspace opacification is concerning for pneumonia. Accessory azygos lobe incidentally noted. Electronically Signed   By: Roanna Raider M.D.   On: 12/25/2015 05:45    Microbiology: Recent Results (from the past 240 hour(s))  Urine culture     Status: None   Collection Time: 12/25/15  4:08 AM  Result Value Ref Range Status   Specimen Description URINE, CLEAN CATCH  Final   Special Requests NONE  Final   Culture NO GROWTH Performed at [facility]   Final   Report Status 12/26/2015 FINAL  Final  Blood culture (routine x 2)     Status: None (Preliminary result)   Collection Time: 12/25/15  4:30 AM  Result Value Ref Range Status   Specimen Description BLOOD RIGHT ANTECUBITAL  Final   Special Requests   Final    BOTTLES DRAWN AEROBIC AND ANAEROBIC AEB 10CC ANA 8CC   Culture NO GROWTH 1 DAY  Final   Report Status PENDING  Incomplete  Blood culture (routine x 2)     Status: None (Preliminary result)   Collection Time: 12/25/15  4:48 AM  Result Value Ref Range Status   Specimen Description BLOOD RIGHT ANTECUBITAL  Final   Special Requests BOTTLES DRAWN AEROBIC AND ANAEROBIC  10CC EACH  Final   Culture NO GROWTH 1 DAY  Final   Report Status PENDING  Incomplete     Labs: Basic Metabolic Panel:  Recent Labs Lab 12/25/15 0429 12/26/15 0516  NA 138 134*  K 4.5 4.2  CL 105 103  CO2 26 24  GLUCOSE 129* 220*  BUN 15 15  CREATININE 1.13 0.92  CALCIUM 8.1* 8.9   Liver Function Tests: No results for input(s): AST, ALT, ALKPHOS, BILITOT, PROT, ALBUMIN in the last 168 hours. No results for input(s): LIPASE, AMYLASE in the last 168 hours. No results for input(s): AMMONIA in the last 168 hours. CBC:  Recent Labs Lab 12/25/15 0429 12/26/15 0516  WBC 10.0 18.8*  NEUTROABS 8.6* 17.8*  HGB 14.3 12.8*  HCT 42.8 38.3*  MCV 90.5 92.1  PLT 209 208   Cardiac Enzymes: No results for input(s): CKTOTAL, CKMB, CKMBINDEX,  TROPONINI in the last 168 hours. BNP: BNP (last 3 results)  Recent Labs  07/03/15 0618  BNP 20.0    ProBNP (last 3 results) No results for input(s): PROBNP in the last 8760 hours.  CBG:  Recent Labs Lab 12/26/15 0726 12/26/15 1103  GLUCAP 152* 158*       Signed:  HERNANDEZ ACOSTA,ESTELA  [facility] Pager: [phone] 12/26/2015, 12:25 PM

## 2015-12-30 LAB — CULTURE, BLOOD (ROUTINE X 2)
CULTURE: NO GROWTH
CULTURE: NO GROWTH

## 2016-01-01 DIAGNOSIS — J441 Chronic obstructive pulmonary disease with (acute) exacerbation: Secondary | ICD-10-CM | POA: Diagnosis not present

## 2016-01-07 ENCOUNTER — Other Ambulatory Visit (HOSPITAL_COMMUNITY): Payer: Self-pay | Admitting: Internal Medicine

## 2016-01-07 ENCOUNTER — Ambulatory Visit (HOSPITAL_COMMUNITY)
Admission: RE | Admit: 2016-01-07 | Discharge: 2016-01-07 | Disposition: A | Discharge status: Home / Self Care | Payer: Commercial Managed Care - HMO | Source: Ambulatory Visit | Attending: Internal Medicine | Admitting: Internal Medicine

## 2016-01-07 DIAGNOSIS — R110 Nausea: Secondary | ICD-10-CM | POA: Diagnosis not present

## 2016-01-07 DIAGNOSIS — E119 Type 2 diabetes mellitus without complications: Secondary | ICD-10-CM | POA: Diagnosis not present

## 2016-01-07 DIAGNOSIS — J189 Pneumonia, unspecified organism: Secondary | ICD-10-CM | POA: Diagnosis not present

## 2016-01-07 DIAGNOSIS — J439 Emphysema, unspecified: Secondary | ICD-10-CM

## 2016-01-07 DIAGNOSIS — F419 Anxiety disorder, unspecified: Secondary | ICD-10-CM | POA: Diagnosis not present

## 2016-01-07 DIAGNOSIS — Z6827 Body mass index (BMI) 27.0-27.9, adult: Secondary | ICD-10-CM | POA: Diagnosis not present

## 2016-01-07 DIAGNOSIS — J441 Chronic obstructive pulmonary disease with (acute) exacerbation: Secondary | ICD-10-CM | POA: Diagnosis not present

## 2016-01-07 DIAGNOSIS — R0902 Hypoxemia: Secondary | ICD-10-CM | POA: Diagnosis not present

## 2016-01-07 DIAGNOSIS — E663 Overweight: Secondary | ICD-10-CM | POA: Diagnosis not present

## 2016-01-07 DIAGNOSIS — K219 Gastro-esophageal reflux disease without esophagitis: Secondary | ICD-10-CM | POA: Diagnosis not present

## 2016-01-08 ENCOUNTER — Inpatient Hospital Stay (HOSPITAL_COMMUNITY)
Admission: EM | Admit: 2016-01-08 | Discharge: 2016-01-10 | DRG: 871 | Disposition: A | Discharge status: Home Health | Payer: Commercial Managed Care - HMO | Attending: Internal Medicine | Admitting: Internal Medicine

## 2016-01-08 ENCOUNTER — Emergency Department (HOSPITAL_COMMUNITY): Payer: Commercial Managed Care - HMO

## 2016-01-08 ENCOUNTER — Encounter (HOSPITAL_COMMUNITY): Payer: Self-pay | Admitting: *Deleted

## 2016-01-08 DIAGNOSIS — Z7951 Long term (current) use of inhaled steroids: Secondary | ICD-10-CM | POA: Diagnosis not present

## 2016-01-08 DIAGNOSIS — Y95 Nosocomial condition: Secondary | ICD-10-CM | POA: Diagnosis present

## 2016-01-08 DIAGNOSIS — E871 Hypo-osmolality and hyponatremia: Secondary | ICD-10-CM | POA: Diagnosis present

## 2016-01-08 DIAGNOSIS — R740 Nonspecific elevation of levels of transaminase and lactic acid dehydrogenase [LDH]: Secondary | ICD-10-CM | POA: Diagnosis not present

## 2016-01-08 DIAGNOSIS — K219 Gastro-esophageal reflux disease without esophagitis: Secondary | ICD-10-CM | POA: Diagnosis present

## 2016-01-08 DIAGNOSIS — J9621 Acute and chronic respiratory failure with hypoxia: Secondary | ICD-10-CM | POA: Diagnosis present

## 2016-01-08 DIAGNOSIS — J449 Chronic obstructive pulmonary disease, unspecified: Secondary | ICD-10-CM | POA: Diagnosis present

## 2016-01-08 DIAGNOSIS — R112 Nausea with vomiting, unspecified: Secondary | ICD-10-CM

## 2016-01-08 DIAGNOSIS — R52 Pain, unspecified: Secondary | ICD-10-CM | POA: Diagnosis not present

## 2016-01-08 DIAGNOSIS — A419 Sepsis, unspecified organism: Secondary | ICD-10-CM | POA: Diagnosis not present

## 2016-01-08 DIAGNOSIS — E785 Hyperlipidemia, unspecified: Secondary | ICD-10-CM | POA: Diagnosis not present

## 2016-01-08 DIAGNOSIS — Z806 Family history of leukemia: Secondary | ICD-10-CM

## 2016-01-08 DIAGNOSIS — R110 Nausea: Secondary | ICD-10-CM | POA: Diagnosis not present

## 2016-01-08 DIAGNOSIS — Z8701 Personal history of pneumonia (recurrent): Secondary | ICD-10-CM

## 2016-01-08 DIAGNOSIS — J440 Chronic obstructive pulmonary disease with acute lower respiratory infection: Secondary | ICD-10-CM | POA: Diagnosis present

## 2016-01-08 DIAGNOSIS — J189 Pneumonia, unspecified organism: Secondary | ICD-10-CM | POA: Diagnosis not present

## 2016-01-08 DIAGNOSIS — I10 Essential (primary) hypertension: Secondary | ICD-10-CM | POA: Diagnosis present

## 2016-01-08 DIAGNOSIS — R7989 Other specified abnormal findings of blood chemistry: Secondary | ICD-10-CM

## 2016-01-08 DIAGNOSIS — Z87891 Personal history of nicotine dependence: Secondary | ICD-10-CM | POA: Diagnosis not present

## 2016-01-08 DIAGNOSIS — Z9981 Dependence on supplemental oxygen: Secondary | ICD-10-CM | POA: Diagnosis not present

## 2016-01-08 DIAGNOSIS — R1111 Vomiting without nausea: Secondary | ICD-10-CM | POA: Diagnosis not present

## 2016-01-08 DIAGNOSIS — R0602 Shortness of breath: Secondary | ICD-10-CM | POA: Diagnosis not present

## 2016-01-08 DIAGNOSIS — R509 Fever, unspecified: Secondary | ICD-10-CM | POA: Diagnosis not present

## 2016-01-08 LAB — BASIC METABOLIC PANEL
Anion gap: 2 — ABNORMAL LOW (ref 5–15)
BUN: 16 mg/dL (ref 6–20)
CHLORIDE: 104 mmol/L (ref 101–111)
CO2: 26 mmol/L (ref 22–32)
CREATININE: 1.21 mg/dL (ref 0.61–1.24)
Calcium: 7.9 mg/dL — ABNORMAL LOW (ref 8.9–10.3)
GFR calc Af Amer: >60 mL/min (ref >60)
GFR calc non Af Amer: >60 mL/min (ref >60)
Glucose, Bld: 155 mg/dL — ABNORMAL HIGH (ref 65–99)
Potassium: 4.4 mmol/L (ref 3.5–5.1)
SODIUM: 132 mmol/L — AB (ref 135–145)

## 2016-01-08 LAB — URINALYSIS, ROUTINE W REFLEX MICROSCOPIC
BILIRUBIN URINE: NEGATIVE
Glucose, UA: NEGATIVE mg/dL
HGB URINE DIPSTICK: NEGATIVE
KETONES UR: NEGATIVE mg/dL
Leukocytes, UA: NEGATIVE
Nitrite: NEGATIVE
PROTEIN: NEGATIVE mg/dL
pH: 5.5 (ref 5.0–8.0)

## 2016-01-08 LAB — CBC WITH DIFFERENTIAL/PLATELET
Basophils Absolute: 0 10*3/uL (ref 0.0–0.1)
Basophils Relative: 0 %
EOS ABS: 0 10*3/uL (ref 0.0–0.7)
EOS PCT: 0 %
HCT: 42.2 % (ref 39.0–52.0)
HEMOGLOBIN: 14.1 g/dL (ref 13.0–17.0)
LYMPHS ABS: 0.7 10*3/uL (ref 0.7–4.0)
Lymphocytes Relative: 6 %
MCH: 30.8 pg (ref 26.0–34.0)
MCHC: 33.4 g/dL (ref 30.0–36.0)
MCV: 92.1 fL (ref 78.0–100.0)
MONO ABS: 0.4 10*3/uL (ref 0.1–1.0)
MONOS PCT: 3 %
NEUTROS PCT: 91 %
Neutro Abs: 10.8 10*3/uL — ABNORMAL HIGH (ref 1.7–7.7)
Platelets: 241 10*3/uL (ref 150–400)
RBC: 4.58 MIL/uL (ref 4.22–5.81)
RDW: 13.4 % (ref 11.5–15.5)
WBC: 11.9 10*3/uL — ABNORMAL HIGH (ref 4.0–10.5)

## 2016-01-08 LAB — I-STAT CG4 LACTIC ACID, ED: Lactic Acid, Venous: 2.12 mmol/L (ref 0.5–1.9)

## 2016-01-08 LAB — HEPATIC FUNCTION PANEL
ALK PHOS: 71 U/L (ref 38–126)
ALT: 18 U/L (ref 17–63)
AST: 19 U/L (ref 15–41)
Albumin: 3.7 g/dL (ref 3.5–5.0)
BILIRUBIN INDIRECT: 0.7 mg/dL (ref 0.3–0.9)
Bilirubin, Direct: 0.2 mg/dL (ref 0.1–0.5)
TOTAL PROTEIN: 6.5 g/dL (ref 6.5–8.1)
Total Bilirubin: 0.9 mg/dL (ref 0.3–1.2)

## 2016-01-08 LAB — MRSA PCR SCREENING: MRSA BY PCR: NEGATIVE

## 2016-01-08 MED ORDER — SODIUM CHLORIDE 0.9 % IV SOLN
INTRAVENOUS | Status: DC
  Administered 2016-01-08 – 2016-01-09 (×3): via INTRAVENOUS

## 2016-01-08 MED ORDER — DULOXETINE HCL 20 MG PO CPEP
20.0000 mg | ORAL_CAPSULE | Freq: Every day | ORAL | Status: DC
  Administered 2016-01-08 – 2016-01-10 (×3): 20 mg via ORAL
  Filled 2016-01-08 (×4): qty 1

## 2016-01-08 MED ORDER — LEVOFLOXACIN IN D5W 750 MG/150ML IV SOLN
750.0000 mg | INTRAVENOUS | Status: DC
  Administered 2016-01-08 – 2016-01-10 (×3): 750 mg via INTRAVENOUS
  Filled 2016-01-08 (×3): qty 150

## 2016-01-08 MED ORDER — ALBUTEROL SULFATE (2.5 MG/3ML) 0.083% IN NEBU
5.0000 mg | INHALATION_SOLUTION | Freq: Once | RESPIRATORY_TRACT | Status: DC
  Filled 2016-01-08: qty 6

## 2016-01-08 MED ORDER — PROPYLENE GLYCOL 0.6 % OP SOLN: 1.0000 [drp] | Freq: Two times a day (BID) | OPHTHALMIC | Status: DC | PRN

## 2016-01-08 MED ORDER — ONDANSETRON HCL 4 MG PO TABS: 4.0000 mg | ORAL_TABLET | Freq: Four times a day (QID) | ORAL | Status: DC | PRN

## 2016-01-08 MED ORDER — DEXTROSE 5 % IV SOLN
2.0000 g | Freq: Once | INTRAVENOUS | Status: AC
  Administered 2016-01-08: 2 g via INTRAVENOUS
  Filled 2016-01-08: qty 2

## 2016-01-08 MED ORDER — MOMETASONE FURO-FORMOTEROL FUM 200-5 MCG/ACT IN AERO
2.0000 | INHALATION_SPRAY | Freq: Two times a day (BID) | RESPIRATORY_TRACT | Status: DC
  Administered 2016-01-08 – 2016-01-10 (×4): 2 via RESPIRATORY_TRACT
  Filled 2016-01-08: qty 8.8

## 2016-01-08 MED ORDER — LEVALBUTEROL HCL 1.25 MG/0.5ML IN NEBU
INHALATION_SOLUTION | RESPIRATORY_TRACT | Status: AC
  Administered 2016-01-08: 2.5 mg
  Filled 2016-01-08: qty 1

## 2016-01-08 MED ORDER — DEXTROSE 5 % IV SOLN
1.0000 g | Freq: Three times a day (TID) | INTRAVENOUS | Status: DC
  Administered 2016-01-08 – 2016-01-09 (×3): 1 g via INTRAVENOUS
  Filled 2016-01-08 (×7): qty 1

## 2016-01-08 MED ORDER — VANCOMYCIN HCL IN DEXTROSE 1-5 GM/200ML-% IV SOLN
1000.0000 mg | Freq: Two times a day (BID) | INTRAVENOUS | Status: DC
  Administered 2016-01-08 (×2): 1000 mg via INTRAVENOUS
  Filled 2016-01-08 (×2): qty 200

## 2016-01-08 MED ORDER — SIMVASTATIN 20 MG PO TABS
20.0000 mg | ORAL_TABLET | Freq: Every day | ORAL | Status: DC
  Administered 2016-01-08 – 2016-01-09 (×2): 20 mg via ORAL
  Filled 2016-01-08 (×2): qty 1

## 2016-01-08 MED ORDER — DIAZEPAM 5 MG PO TABS
10.0000 mg | ORAL_TABLET | Freq: Three times a day (TID) | ORAL | Status: DC
  Administered 2016-01-08 – 2016-01-10 (×7): 10 mg via ORAL
  Filled 2016-01-08 (×7): qty 2

## 2016-01-08 MED ORDER — HEPARIN SODIUM (PORCINE) 5000 UNIT/ML IJ SOLN
5000.0000 [IU] | Freq: Three times a day (TID) | INTRAMUSCULAR | Status: DC
  Administered 2016-01-08 – 2016-01-10 (×8): 5000 [IU] via SUBCUTANEOUS
  Filled 2016-01-08 (×7): qty 1

## 2016-01-08 MED ORDER — PANTOPRAZOLE SODIUM 40 MG PO TBEC
40.0000 mg | DELAYED_RELEASE_TABLET | Freq: Every day | ORAL | Status: DC
  Administered 2016-01-08 – 2016-01-10 (×3): 40 mg via ORAL
  Filled 2016-01-08 (×3): qty 1

## 2016-01-08 MED ORDER — ALBUTEROL SULFATE (2.5 MG/3ML) 0.083% IN NEBU: 2.5000 mg | INHALATION_SOLUTION | RESPIRATORY_TRACT | Status: DC | PRN

## 2016-01-08 MED ORDER — VANCOMYCIN HCL IN DEXTROSE 1-5 GM/200ML-% IV SOLN
1000.0000 mg | Freq: Once | INTRAVENOUS | Status: AC
  Administered 2016-01-08: 1000 mg via INTRAVENOUS
  Filled 2016-01-08: qty 200

## 2016-01-08 MED ORDER — SODIUM CHLORIDE 0.9 % IV BOLUS (SEPSIS)
1000.0000 mL | Freq: Once | INTRAVENOUS | Status: AC
  Administered 2016-01-08: 1000 mL via INTRAVENOUS

## 2016-01-08 MED ORDER — OXYCODONE-ACETAMINOPHEN 7.5-325 MG PO TABS
1.0000 | ORAL_TABLET | ORAL | Status: DC | PRN
  Administered 2016-01-08 – 2016-01-10 (×9): 1 via ORAL
  Filled 2016-01-08 (×9): qty 1

## 2016-01-08 MED ORDER — POLYVINYL ALCOHOL 1.4 % OP SOLN
1.0000 [drp] | OPHTHALMIC | Status: DC | PRN
  Filled 2016-01-08: qty 15

## 2016-01-08 MED ORDER — ONDANSETRON HCL 4 MG/2ML IJ SOLN: 4.0000 mg | Freq: Four times a day (QID) | INTRAMUSCULAR | Status: DC | PRN

## 2016-01-08 NOTE — H&P (Signed)
History and Physical    [name]:[id number] DOB: 1950-06-08 DOA: 01/08/2016  PCP: Cassell Smiles., MD  Patient coming from: Home.    Chief Complaint:   SOB.  Coughs, fever, chills, and some myalgia.   HPI: [name] is an 65 y.o. male with hx HTN, COPD on home oxygen of 2 L [place], hx of prior tobacco abuse, recently admitted and Tx for CAP, returned to the ER as he developed SOB, myalgia, coughs, and having fever, and chills.  On Sept 5, he had similar symptoms, and was Dx with CAP, Tx and felt better.  He had influenza tested negative at that time.  No distant travel or ill contact.  Evaluation in the ER showed CXR with new infiltrate on the right side suspicious for developing PNA.  He has leukocytosis of 12K, and having normal renal Fx tests.  His lactic acid was slightly elevated to 12, and his BP was solf at 100 systolic.  HR was 130, but he was not in any distress, conversing and mentating well.  He was given IV Zenaida Niece and IV Cefepime, IVF, and hospitalist was asked to admit him for HCAP, respiratory failure, and early sepsis.   ED Course:  See above.  Rewiew of Systems:  Constitutional: Negative for malaise, fever and chills. No significant weight loss or weight gain Eyes: Negative for eye pain, redness and discharge, diplopia, visual changes, or flashes of light. ENMT: Negative for ear pain, hoarseness, nasal congestion, sinus pressure and sore throat. No headaches; tinnitus, drooling, or problem swallowing. Cardiovascular: Negative for chest pain, palpitations, diaphoresis, and peripheral edema. ; No orthopnea, PND Respiratory: Negative for  hemoptysis, wheezing and stridor. No pleuritic chestpain. Gastrointestinal: Negative for diarrhea, constipation,  melena, blood in stool, hematemesis, jaundice and rectal bleeding.    Genitourinary: Negative for frequency, dysuria, incontinence,flank pain and hematuria; Musculoskeletal: Negative for back pain and neck pain. Negative for  swelling and trauma.;  Skin: . Negative for pruritus, rash, abrasions, bruising and skin lesion.; ulcerations Neuro: Negative for headache, lightheadedness and neck stiffness. Negative for weakness, altered level of consciousness , altered mental status, extremity weakness, burning feet, involuntary movement, seizure and syncope.  Psych: negative for anxiety, depression, insomnia, tearfulness, panic attacks, hallucinations, paranoia, suicidal or homicidal ideation    Past Medical History:  Diagnosis Date  . 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 Dyslipidemia   . History of tobacco abuse    80 pack year history     Rewiew of Systems:  Constitutional: Negative for malaise, fever and chills. No significant weight loss or weight gain Eyes: Negative for eye pain, redness and discharge, diplopia, visual changes, or flashes of light. ENMT: Negative for ear pain, hoarseness, nasal congestion, sinus pressure and sore throat. No headaches; tinnitus, drooling, or problem swallowing. Cardiovascular: Negative for chest pain, palpitations, diaphoresis, dyspnea and peripheral edema. ; No orthopnea, PND Respiratory: Negative for cough, hemoptysis, wheezing and stridor. No pleuritic chestpain. Gastrointestinal: Negative for nausea, vomiting, diarrhea, constipation, abdominal pain, melena, blood in stool, hematemesis, jaundice and rectal bleeding.    Genitourinary: Negative for frequency, dysuria, incontinence,flank pain and hematuria; Musculoskeletal: Negative for back pain and neck pain. Negative for swelling and trauma.;  Skin: . Negative for pruritus, rash, abrasions, bruising and skin lesion.; ulcerations Neuro: Negative for headache, lightheadedness and neck stiffness. Negative for weakness, altered level of consciousness , altered mental status, extremity weakness, burning feet, involuntary movement, seizure and syncope.  Psych: negative for anxiety,  depression, insomnia,  tearfulness, panic attacks, hallucinations, paranoia, suicidal or homicidal ideation   Past Surgical History:  Procedure Laterality Date  . CARDIAC CATHETERIZATION  08/10/2008   right & left - normal coronaries (Dr. Claudia Desanctis)  . NM MYOCAR PERF WALL MOTION  07/2008   bruce myoview - mild ischemia in basal inferoseptal & mid inferoseptal regions; EF 63%  . TRANSTHORACIC ECHOCARDIOGRAM  07/2008   EF=>55%; RV mildly dilated; RA mild-mod dilated; mild mitral annular calcification & mild MR; AV mildly sclerotic     reports that he quit smoking about 8 years ago. He started smoking about 41 years ago. He has never used smokeless tobacco. He reports that he does not drink alcohol or use drugs.  Allergies  Allergen Reactions  . Ativan [Lorazepam] Other (See Comments)    Dry mouth sinus and breathing problems     Family History  Problem Relation Age of Onset  . Leukemia Mother   . Suicidality Father      Prior to Admission medications   Medication Sig Start Date End Date Taking? Authorizing Provider  albuterol (PROVENTIL HFA;VENTOLIN HFA) 108 (90 BASE) MCG/ACT inhaler Inhale 2 puffs into the lungs every 6 (six) hours as needed for wheezing or shortness of breath.    Historical Provider, MD  albuterol (PROVENTIL) (2.5 MG/3ML) 0.083% nebulizer solution Take 2.5 mg by nebulization every 4 (four) hours as needed for wheezing or shortness of breath.    Historical Provider, MD  Aromatic Inhalants (VICKS VAPOR INHALER IN) Place 1 puff into both nostrils daily as needed (congestion).    Historical Provider, MD  diazepam (VALIUM) 10 MG tablet Take 10 mg by mouth 3 (three) times daily. Reported on 07/15/2015    Historical Provider, MD  DULoxetine (CYMBALTA) 20 MG capsule Take 20 mg by mouth daily.    Historical Provider, MD  levofloxacin (LEVAQUIN) 500 MG tablet Take 1 tablet (500 mg total) by mouth daily. 12/26/15   Henderson Cloud, MD  magnesium oxide (MAG-OX) 400 MG tablet Take 400 mg by  mouth daily.    Historical Provider, MD  omeprazole (PRILOSEC) 40 MG capsule Take 40 mg by mouth daily.  01/10/13   Historical Provider, MD  oxyCODONE-acetaminophen (PERCOCET) 7.5-325 MG per tablet Take 1 tablet by mouth every 4 (four) hours as needed for pain.    Historical Provider, MD  Propylene Glycol (SYSTANE BALANCE) 0.6 % SOLN Place 1 drop into both eyes 2 (two) times daily as needed (dry eyes).    Historical Provider, MD  simvastatin (ZOCOR) 20 MG tablet TAKE 1 TABLET BY MOUTH DAILY AT 6 PM. 09/21/15   Laqueta Linden, MD  SYMBICORT 160-4.5 MCG/ACT inhaler Inhale 2 puffs into the lungs 2 (two) times daily. 12/21/15   Historical Provider, MD    Physical Exam: Vitals:   01/08/16 0304 01/08/16 0315 01/08/16 0330 01/08/16 0345  BP:   101/73   Pulse:   (!) 149 (!) 141  Resp:  23 20 23   Temp:      TempSrc:      SpO2: 91%  93% 93%  Weight:      Height:          Constitutional: NAD, calm, comfortable Vitals:   01/08/16 0304 01/08/16 0315 01/08/16 0330 01/08/16 0345  BP:   101/73   Pulse:   (!) 149 (!) 141  Resp:  23 20 23   Temp:      TempSrc:      SpO2: 91%  93% 93%  Weight:      Height:       Eyes: PERRL, lids and conjunctivae normal ENMT: Mucous membranes are moist. Posterior pharynx clear of any exudate or lesions.Normal dentition.  Neck: normal, supple, no masses, no thyromegaly Respiratory: diffuse rhonchi with no wheezing, no crackles. Normal respiratory effort. No accessory muscle use.  Cardiovascular: Regular rate and rhythm, no murmurs / rubs / gallops. No extremity edema. 2+ pedal pulses. No carotid bruits.  Abdomen: no tenderness, no masses palpated. No hepatosplenomegaly. Bowel sounds positive.  Musculoskeletal: no clubbing / cyanosis. No joint deformity upper and lower extremities. Good ROM, no contractures. Normal muscle tone.  Skin: no rashes, lesions, ulcers. No induration Neurologic: CN 2-12 grossly intact. Sensation intact, DTR normal. Strength 5/5 in all  4.  Psychiatric: Normal judgment and insight. Alert and oriented x 3. Normal mood.     Labs on Admission: I have personally reviewed following labs and imaging studies  CBC:  Recent Labs Lab 01/08/16 0250  WBC 11.9*  NEUTROABS 10.8*  HGB 14.1  HCT 42.2  MCV 92.1  PLT 241   Basic Metabolic Panel:  Recent Labs Lab 01/08/16 0250  NA 132*  K 4.4  CL 104  CO2 26  GLUCOSE 155*  BUN 16  CREATININE 1.21  CALCIUM 7.9*   GFR: Estimated Creatinine Clearance: 66.2 mL/min (by C-G formula based on SCr of 1.21 mg/dL). Liver Function Tests:  Recent Labs Lab 01/08/16 0250  AST 19  ALT 18  ALKPHOS 71  BILITOT 0.9  PROT 6.5  ALBUMIN 3.7  Urine analysis:    Component Value Date/Time   COLORURINE AMBER (A) 01/08/2016 0312   APPEARANCEUR CLEAR 01/08/2016 0312   LABSPEC >1.030 (H) 01/08/2016 0312   PHURINE 5.5 01/08/2016 0312   GLUCOSEU NEGATIVE 01/08/2016 0312   HGBUR NEGATIVE 01/08/2016 0312   BILIRUBINUR NEGATIVE 01/08/2016 0312   KETONESUR NEGATIVE 01/08/2016 0312   PROTEINUR NEGATIVE 01/08/2016 0312   NITRITE NEGATIVE 01/08/2016 0312   LEUKOCYTESUR NEGATIVE 01/08/2016 0312   ) Recent Results (from the past 240 hour(s))  Blood Culture (routine x 2)     Status: None (Preliminary result)   Collection Time: 01/08/16  3:21 AM  Result Value Ref Range Status   Specimen Description BLOOD RIGHT HAND  Final   Special Requests   Final    BOTTLES DRAWN AEROBIC AND ANAEROBIC AEB 12CC ANA 10CC   Culture PENDING  Incomplete   Report Status PENDING  Incomplete  Blood Culture (routine x 2)     Status: None (Preliminary result)   Collection Time: 01/08/16  3:28 AM  Result Value Ref Range Status   Specimen Description BLOOD LEFT HAND  Final   Special Requests   Final    BOTTLES DRAWN AEROBIC AND ANAEROBIC AEB 12CC ANA 4CC   Culture PENDING  Incomplete   Report Status PENDING  Incomplete     Radiological Exams on Admission: Dg Chest 2 View  Result Date:  01/07/2016 CLINICAL DATA:  Bilateral pneumonia 1 week ago.  No symptoms now. EXAM: CHEST  2 VIEW COMPARISON:  12/25/2015.  07/15/2015 FINDINGS: The relatively diffuse airspace disease seen previously in the right lung has resolved in the interval. Imaging features now similar to prior study of 07/15/2015. Lungs are hyperexpanded. Interstitial markings are diffusely coarsened with chronic features. The cardiopericardial silhouette is within normal limits for size. The visualized bony structures of the thorax are intact. IMPRESSION: Interval resolution of airspace disease with no acute cardiopulmonary findings on today's study. Emphysema.  Electronically Signed   By: Kennith Center M.D.   On: 01/07/2016 10:33   Dg Chest Port 1 View  Result Date: 01/08/2016 CLINICAL DATA:  Acute onset of vomiting, chills, shortness of breath and generalized body aches. Initial encounter. EXAM: PORTABLE CHEST 1 VIEW COMPARISON:  Chest radiograph performed 01/07/2016 FINDINGS: Fluffy right-sided airspace opacification is noted, concerning for significant pneumonia. The left lung appears relatively clear. No definite pleural effusion or pneumothorax is seen. The cardiomediastinal silhouette remains normal in size. No acute osseous abnormalities are seen. IMPRESSION: Fluffy right-sided airspace opacification, concerning for significant pneumonia. Electronically Signed   By: Roanna Raider M.D.   On: 01/08/2016 03:50    EKG: Independently reviewed.   Assessment/Plan    Dyslipidemia   COPD (chronic obstructive pulmonary disease) (HCC)   GERD (gastroesophageal reflux disease)   PNA (pneumonia)   HCAP (healthcare-associated pneumonia)    PLAN:   PNA:  Will tx him for HCAP, since he ws recently admitted, and continue with IV Van/ IV Cefepime.  He also should be covered for atypical PNA, so will add Levaquin to his regimen.  Will check for Legionella, and HIV testing as well.  BC were obtained, and will follow up with his BP  and IVF.  Will admit him to ICU for closer monitoring.   COPD:  Continue with home meds.    GERD:  Stable.  Will continue with PPI.  HLD:  Continue with current statin.     DVT prophylaxis: SubQ Code Status: FULL  Family Communication: None at bedside.  Disposition Plan: To home when appropriate.  Consults called: None.  Admission status: Inpatient due to PNA and hypoxia, as in acute on chronic respiratory failure.    Harmoni Lucus MD FACP. [facility]  If 7PM-7AM, please contact night-coverage [email or web address] Password [facility]  01/08/2016, 4:34 AM

## 2016-01-08 NOTE — Consult Note (Signed)
   [facility]   01/08/2016  Fabio NeighborsRoy L Brogan 03-20-1951 [id number]  Spoke with patient and his wife, Kathie RhodesBetty, at bedside regarding [facility] services. Patient does not want to participate with [facility] at this time. Patient given [facility] broch[facility] and contact information for future reference, voices appreciation of information.    Inpatient case manager aware that [facility] offered [facility] services but declined.   Of note, [facility] Care Management services would not replace or interfere with any services that are arranged by inpatient case management or social work. For additional questions or referrals please contact:   Alben SpittleMary E. Albertha GheeNiemczura, RN, BSN, [facility]   [facility] Hospital Liaison [phone]

## 2016-01-08 NOTE — ED Triage Notes (Signed)
Pt brought in by [facility] for c/o vomiting and chills and generalized body aches; pt was seen at his PCP yesterday for same complaint and unable to give any information;

## 2016-01-08 NOTE — Progress Notes (Signed)
Pharmacy Antibiotic Note  [name] is [name] 65 y.o. male admitted on 01/08/2016 with pneumonia.  Pharmacy has been consulted for Vancomycin and Cefepime  Dosing.  Pt is also on Levaquin.  Plan:  Vancomycin 1000mg  IV q12h (will start 2nd dose early as pt not fully loaded) Check trough at steady state Cefepime 1gm IV q8h Levaquin 750mg  IV q24hrs (per MD) Monitor labs, renal fxn, progress and c/s Deescalate ABX when improved / appropriate.    Height: 5\' 9"  (175.3 cm) Weight: 194 lb 0.1 oz (88 kg) IBW/kg (Calculated) : 70.7  Temp (24hrs), Avg:99.9 F (37.7 C), Min:99.2 F (37.3 C), Max:100.4 F (38 C)   Recent Labs Lab 01/08/16 0250 01/08/16 0324  WBC 11.9*  --   CREATININE 1.21  --   LATICACIDVEN  --  2.12*    Estimated Creatinine Clearance: 66.8 mL/min (by C-G formula based on SCr of 1.21 mg/dL).    Allergies  Allergen Reactions  . Ativan [Lorazepam] Other (See Comments)    Dry mouth sinus and breathing problems    Antimicrobials this admission: Vancomycin  9/19 >>  Cefepime  9/19 >>  Levaquin  9/19 >>  Dose adjustments this admission:  Recent Results (from the past 240 hour(s))  Urine culture     Status: None (Preliminary result)   Collection Time: 01/08/16  3:12 AM  Result Value Ref Range Status   Specimen Description URINE, CLEAN CATCH  Final   Special Requests NONE  Final   Culture PENDING  Incomplete   Report Status PENDING  Incomplete  Blood Culture (routine x 2)     Status: None (Preliminary result)   Collection Time: 01/08/16  3:21 AM  Result Value Ref Range Status   Specimen Description BLOOD RIGHT HAND  Final   Special Requests   Final    BOTTLES DRAWN AEROBIC AND ANAEROBIC AEB 12CC ANA 10CC   Culture NO GROWTH < 12 HOURS  Final   Report Status PENDING  Incomplete  Blood Culture (routine x 2)     Status: None (Preliminary result)   Collection Time: 01/08/16  3:28 AM  Result Value Ref Range Status   Specimen Description BLOOD LEFT HAND  Final   Special Requests   Final    BOTTLES DRAWN AEROBIC AND ANAEROBIC AEB 12CC ANA 4CC   Culture NO GROWTH < 12 HOURS  Final   Report Status PENDING  Incomplete  MRSA PCR Screening     Status: None   Collection Time: 01/08/16  6:05 AM  Result Value Ref Range Status   MRSA by PCR NEGATIVE NEGATIVE Final    Comment:        The GeneXpert MRSA Assay (FDA approved for NASAL specimens only), is one component of [name] comprehensive MRSA colonization surveillance program. It is not intended to diagnose MRSA infection nor to guide or monitor treatment for MRSA infections.     Thank you for allowing pharmacy to be [name] part of this patient's care.  Valrie HartHall, [name] 01/08/2016 10:26 AM

## 2016-01-08 NOTE — Progress Notes (Signed)
PROGRESS NOTE    [name]  ZOX:[id number] DOB: Dec 05, 1950 DOA: 01/08/2016 PCP: Cassell Smiles., MD   Brief Narrative:  [name] is a 65 year old male with a past medical history of chronic obstructive pulmonary disease, chronic hypoxemic respiratory failure, who was recently admitted from 12/25/2015 through 12/26/2015 at which time he was treated for community-acquired pneumonia with IV ceftriaxone and azithromycin. He was discharged on Levaquin. He presented to the emergency department on 01/08/2016 with complaints of shortness of breath associated with cough, fevers, chills. Workup included a chest x-ray that revealed right-sided airspace opacification concerning for pneumonia. He was admitted by Dr Conley Rolls this morning.   Assessment & Plan:   Active Problems:   Dyslipidemia   COPD (chronic obstructive pulmonary disease) (HCC)   GERD (gastroesophageal reflux disease)   PNA (pneumonia)   HCAP (healthcare-associated pneumonia)  1.  Suspected healthcare associated pneumonia -[name] was recently hospitalized from 12/25/2015 denies excessive 17 treated for community-acquired pneumonia. Now he presents with worsening symptoms. Cough shortness of breath and fevers. Chest x-ray performed on 01/08/2016 showing right sided airspace opacification. -This morning he had a temperature of 100.4. -Blood cultures were drawn in the emergency room. -A continue broad-spectrum IV antibiotic therapy with cefepime, vancomycin, Levaquin -Consider narrowing is antibiotics in a.m.  2.  Acute on chronic hypoxemic respiratory failure -Evidenced by an O2 sat of 84%, having an increased oxygen requirement. At baseline he requires 2 L of oxygen via nasal cannula -Likely secondary to healthcare associated pneumonia   DVT prophylaxis: Heparin Code Status: Full code Family Communication: Updated family members at bedside Disposition Plan:   Consultants:     Procedures:     Antimicrobials:    Vancomycin started on 01/08/2016  Cefepime started on 01/08/2016  Levaquin started on the 19th 2008   Subjective: States feeling ill, has ongoing shortness of breath, cough, fevers and chills  Objective: Vitals:   01/08/16 1100 01/08/16 1200 01/08/16 1249 01/08/16 1300  BP: 114/73 (!) 145/97    Pulse: (!) 103 (!) 117  (!) 117  Resp: (!) 30 19  (!) 23  Temp:   99.4 F (37.4 C)   TempSrc:   Oral   SpO2: 96% 91%  (!) 84%  Weight:      Height:        Intake/Output Summary (Last 24 hours) at 01/08/16 1413 Last data filed at 01/08/16 1040  Gross per 24 hour  Intake             4550 ml  Output              600 ml  Net             3950 ml   Filed Weights   01/08/16 0244 01/08/16 0603  Weight: 86.2 kg (190 lb) 88 kg (194 lb 0.1 oz)    Examination:  General exam: Ill-appearing, mildly dyspneic at rest Respiratory system: Breath sounds diminished bilaterally, positive rhonchi, few expiratory wheezes, coarse respiratory sounds on supplemental oxygen. Cardiovascular system: S1 & S2 heard, RRR. No JVD, murmurs, rubs, gallops or clicks. No pedal edema. Gastrointestinal system: Abdomen is nondistended, soft and nontender. No organomegaly or masses felt. Normal bowel sounds heard. Central nervous system: Alert and oriented. No focal neurological deficits. Extremities: Symmetric 5 x 5 power. Skin: No rashes, lesions or ulcers Psychiatry: Judgement and insight appear normal. Mood & affect appropriate.     Data Reviewed: I have personally reviewed following labs and imaging studies  CBC:  Recent Labs Lab 01/08/16 0250  WBC 11.9*  NEUTROABS 10.8*  HGB 14.1  HCT 42.2  MCV 92.1  PLT 241   Basic Metabolic Panel:  Recent Labs Lab 01/08/16 0250  NA 132*  K 4.4  CL 104  CO2 26  GLUCOSE 155*  BUN 16  CREATININE 1.21  CALCIUM 7.9*   GFR: Estimated Creatinine Clearance: 66.8 mL/min (by C-G formula based on SCr of 1.21 mg/dL). Liver Function Tests:  Recent  Labs Lab 01/08/16 0250  AST 19  ALT 18  ALKPHOS 71  BILITOT 0.9  PROT 6.5  ALBUMIN 3.7   No results for input(s): LIPASE, AMYLASE in the last 168 hours. No results for input(s): AMMONIA in the last 168 hours. Coagulation Profile: No results for input(s): INR, PROTIME in the last 168 hours. Cardiac Enzymes: No results for input(s): CKTOTAL, CKMB, CKMBINDEX, TROPONINI in the last 168 hours. BNP (last 3 results) No results for input(s): PROBNP in the last 8760 hours. HbA1C: No results for input(s): HGBA1C in the last 72 hours. CBG: No results for input(s): GLUCAP in the last 168 hours. Lipid Profile: No results for input(s): CHOL, HDL, LDLCALC, TRIG, CHOLHDL, LDLDIRECT in the last 72 hours. Thyroid Function Tests: No results for input(s): TSH, T4TOTAL, FREET4, T3FREE, THYROIDAB in the last 72 hours. Anemia Panel: No results for input(s): VITAMINB12, FOLATE, FERRITIN, TIBC, IRON, RETICCTPCT in the last 72 hours. Sepsis Labs:  Recent Labs Lab 01/08/16 0324  LATICACIDVEN 2.12*    Recent Results (from the past 240 hour(s))  Urine culture     Status: None (Preliminary result)   Collection Time: 01/08/16  3:12 AM  Result Value Ref Range Status   Specimen Description URINE, CLEAN CATCH  Final   Special Requests NONE  Final   Culture PENDING  Incomplete   Report Status PENDING  Incomplete  Blood Culture (routine x 2)     Status: None (Preliminary result)   Collection Time: 01/08/16  3:21 AM  Result Value Ref Range Status   Specimen Description BLOOD RIGHT HAND  Final   Special Requests   Final    BOTTLES DRAWN AEROBIC AND ANAEROBIC AEB 12CC ANA 10CC   Culture NO GROWTH < 12 HOURS  Final   Report Status PENDING  Incomplete  Blood Culture (routine x 2)     Status: None (Preliminary result)   Collection Time: 01/08/16  3:28 AM  Result Value Ref Range Status   Specimen Description BLOOD LEFT HAND  Final   Special Requests   Final    BOTTLES DRAWN AEROBIC AND ANAEROBIC AEB  12CC ANA 4CC   Culture NO GROWTH < 12 HOURS  Final   Report Status PENDING  Incomplete  MRSA PCR Screening     Status: None   Collection Time: 01/08/16  6:05 AM  Result Value Ref Range Status   MRSA by PCR NEGATIVE NEGATIVE Final    Comment:        The GeneXpert MRSA Assay (FDA approved for NASAL specimens only), is one component of a comprehensive MRSA colonization surveillance program. It is not intended to diagnose MRSA infection nor to guide or monitor treatment for MRSA infections.          Radiology Studies: Dg Chest 2 View  Result Date: 01/07/2016 CLINICAL DATA:  Bilateral pneumonia 1 week ago.  No symptoms now. EXAM: CHEST  2 VIEW COMPARISON:  12/25/2015.  07/15/2015 FINDINGS: The relatively diffuse airspace disease seen previously in the right lung has resolved in the  interval. Imaging features now similar to prior study of 07/15/2015. Lungs are hyperexpanded. Interstitial markings are diffusely coarsened with chronic features. The cardiopericardial silhouette is within normal limits for size. The visualized bony structures of the thorax are intact. IMPRESSION: Interval resolution of airspace disease with no acute cardiopulmonary findings on today's study. Emphysema. Electronically Signed   By: Kennith Center M.D.   On: 01/07/2016 10:33   Dg Chest Port 1 View  Result Date: 01/08/2016 CLINICAL DATA:  Acute onset of vomiting, chills, shortness of breath and generalized body aches. Initial encounter. EXAM: PORTABLE CHEST 1 VIEW COMPARISON:  Chest radiograph performed 01/07/2016 FINDINGS: Fluffy right-sided airspace opacification is noted, concerning for significant pneumonia. The left lung appears relatively clear. No definite pleural effusion or pneumothorax is seen. The cardiomediastinal silhouette remains normal in size. No acute osseous abnormalities are seen. IMPRESSION: Fluffy right-sided airspace opacification, concerning for significant pneumonia. Electronically Signed    By: Roanna Raider M.D.   On: 01/08/2016 03:50        Scheduled Meds: . ceFEPime (MAXIPIME) IV  1 g Intravenous Q8H  . diazepam  10 mg Oral TID  . DULoxetine  20 mg Oral Daily  . heparin  5,000 Units Subcutaneous Q8H  . levofloxacin (LEVAQUIN) IV  750 mg Intravenous Q24H  . mometasone-formoterol  2 puff Inhalation BID  . pantoprazole  40 mg Oral Daily  . simvastatin  20 mg Oral q1800  . vancomycin  1,000 mg Intravenous Q12H   Continuous Infusions: . sodium chloride Stopped (01/08/16 0621)     LOS: 0 days    Time spent: No charge    Jeralyn Bennett, MD [facility] Pager [phone]  If 7PM-7AM, please contact night-coverage [email or web address] Password [email or web address] 01/08/2016, 2:13 PM

## 2016-01-08 NOTE — ED Provider Notes (Signed)
[facility]-EMERGENCY DEPT Provider Note   CSN: [id number] Arrival date & time: 01/08/16  0241     History   Chief Complaint Chief Complaint  Patient presents with  . Generalized Body Aches    HPI [name] is a 65 y.o. male. He has history of COPD and hyperlipidemia. He had recently been admitted for pneumonia. He was doing well until about midnight when he developed chills and subjective fever. There is associated dyspnea and cough productive of yellow sputum. He complains of generalized malaise but does not have arthralgias or myalgias. He states he quit smoking in 2009.  The history is provided by the patient.    Past Medical History:  Diagnosis Date  . 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 Dyslipidemia   . History of tobacco abuse    80 pack year history     Patient Active Problem List   Diagnosis Date Noted  . PNA (pneumonia) 12/25/2015  . Chronic respiratory failure with hypoxia (HCC) 12/25/2015  . COPD exacerbation (HCC)   . Sepsis (HCC) 07/03/2015  . CAP (community acquired pneumonia) 07/03/2015  . Acute respiratory failure with hypoxia (HCC) 07/03/2015  . GERD (gastroesophageal reflux disease) 07/03/2015  . Coronary artery disease 10/13/2013  . Anxiety 10/13/2013  . Dyslipidemia 06/23/2013  . COPD (chronic obstructive pulmonary disease) (HCC) 06/23/2013  . SHOULDER PAIN 08/16/2008  . SHOULDER IMPINGEMENT SYNDROME, LEFT 08/16/2008    Past Surgical History:  Procedure Laterality Date  . CARDIAC CATHETERIZATION  08/10/2008   right & left - normal coronaries (Dr. Claudia DesanctisH. Solomon)  . NM MYOCAR PERF WALL MOTION  07/2008   bruce myoview - mild ischemia in basal inferoseptal & mid inferoseptal regions; EF 63%  . TRANSTHORACIC ECHOCARDIOGRAM  07/2008   EF=>55%; RV mildly dilated; RA mild-mod dilated; mild mitral annular calcification & mild MR; AV mildly sclerotic       Home Medications    Prior to Admission medications   Medication  Sig Start Date End Date Taking? Authorizing Provider  albuterol (PROVENTIL HFA;VENTOLIN HFA) 108 (90 BASE) MCG/ACT inhaler Inhale 2 puffs into the lungs every 6 (six) hours as needed for wheezing or shortness of breath.    Historical Provider, MD  albuterol (PROVENTIL) (2.5 MG/3ML) 0.083% nebulizer solution Take 2.5 mg by nebulization every 4 (four) hours as needed for wheezing or shortness of breath.    Historical Provider, MD  Aromatic Inhalants (VICKS VAPOR INHALER IN) Place 1 puff into both nostrils daily as needed (congestion).    Historical Provider, MD  diazepam (VALIUM) 10 MG tablet Take 10 mg by mouth 3 (three) times daily. Reported on 07/15/2015    Historical Provider, MD  DULoxetine (CYMBALTA) 20 MG capsule Take 20 mg by mouth daily.    Historical Provider, MD  levofloxacin (LEVAQUIN) 500 MG tablet Take 1 tablet (500 mg total) by mouth daily. 12/26/15   Henderson CloudEstela Y Hernandez Acosta, MD  magnesium oxide (MAG-OX) 400 MG tablet Take 400 mg by mouth daily.    Historical Provider, MD  omeprazole (PRILOSEC) 40 MG capsule Take 40 mg by mouth daily.  01/10/13   Historical Provider, MD  oxyCODONE-acetaminophen (PERCOCET) 7.5-325 MG per tablet Take 1 tablet by mouth every 4 (four) hours as needed for pain.    Historical Provider, MD  Propylene Glycol (SYSTANE BALANCE) 0.6 % SOLN Place 1 drop into both eyes 2 (two) times daily as needed (dry eyes).    Historical Provider, MD  simvastatin (ZOCOR) 20 MG  tablet TAKE 1 TABLET BY MOUTH DAILY AT 6 PM. 09/21/15   Laqueta Linden, MD  SYMBICORT 160-4.5 MCG/ACT inhaler Inhale 2 puffs into the lungs 2 (two) times daily. 12/21/15   Historical Provider, MD    Family History Family History  Problem Relation Age of Onset  . Leukemia Mother   . Suicidality Father     Social History Social History  Substance Use Topics  . Smoking status: Former Smoker    Start date: 04/21/1974    Quit date: 06/22/2007  . Smokeless tobacco: Never Used  . Alcohol use No      Allergies   Ativan [lorazepam]   Review of Systems Review of Systems  All other systems reviewed and are negative.    Physical Exam Updated Vital Signs BP 152/77 (BP Location: Left Arm)   Pulse (!) 151   Temp 100.2 F (37.9 C) (Oral)   Resp (!) 29   Ht 5\' 9"  (1.753 m)   Wt 190 lb (86.2 kg)   SpO2 (!) 83%   BMI 28.06 kg/m   Physical Exam  Nursing note and vitals reviewed.  65 year old male, resting comfortably and in no acute distress. Vital signs are significant for tachypnea and tachycardia and hypertension. Although technically not a fever, temperature is 100.2. Oxygen saturation is 83%, which is hypoxic. Head is normocephalic and atraumatic. PERRLA, EOMI. Oropharynx is clear. Neck is nontender and supple without adenopathy or JVD. Back is nontender and there is no CVA tenderness. Lungs have decreased air flow with a few scattered wheezes. There are no rales or rhonchi. Chest is nontender. Heart has regular rate and rhythm without murmur. Abdomen is soft, flat, nontender without masses or hepatosplenomegaly and peristalsis is normoactive. Extremities have no cyanosis or edema, full range of motion is present. Skin is warm and dry without rash. Neurologic: Mental status is normal, cranial nerves are intact, there are no motor or sensory deficits.  ED Treatments / Results  Labs (all labs ordered are listed, but only abnormal results are displayed) Labs Reviewed  CBC WITH DIFFERENTIAL/PLATELET - Abnormal; Notable for the following:       Result Value   WBC 11.9 (*)    Neutro Abs 10.8 (*)    All other components within normal limits  BASIC METABOLIC PANEL - Abnormal; Notable for the following:    Sodium 132 (*)    Glucose, Bld 155 (*)    Calcium 7.9 (*)    Anion gap 2 (*)    All other components within normal limits  URINALYSIS, ROUTINE W REFLEX MICROSCOPIC (NOT AT [facility]) - Abnormal; Notable for the following:    Color, Urine AMBER (*)    Specific Gravity,  Urine >1.030 (*)    All other components within normal limits  I-STAT CG4 LACTIC ACID, ED - Abnormal; Notable for the following:    Lactic Acid, Venous 2.12 (*)    All other components within normal limits  CULTURE, BLOOD (ROUTINE X 2)  CULTURE, BLOOD (ROUTINE X 2)  URINE CULTURE  HEPATIC FUNCTION PANEL  HIV ANTIBODY (ROUTINE TESTING)  LEGIONELLA PNEUMOPHILA SEROGP 1 UR AG    EKG  EKG Interpretation  Date/Time:  Tuesday January 08 2016 02:49:34 EDT Ventricular Rate:  152 PR Interval:    QRS Duration: 76 QT Interval:  291 QTC Calculation: 463 R Axis:   -92 Text Interpretation:  Sinus tachycardia Left anterior fascicular block Repolarization abnormality, prob rate related When compared with ECG of 12/25/2015, No significant change  was found Confirmed by [facility]  MD, Demyan Fugate ([id number]) on 01/08/2016 2:59:41 AM       Radiology Dg Chest 2 View  Result Date: 01/07/2016 CLINICAL DATA:  Bilateral pneumonia 1 week ago.  No symptoms now. EXAM: CHEST  2 VIEW COMPARISON:  12/25/2015.  07/15/2015 FINDINGS: The relatively diffuse airspace disease seen previously in the right lung has resolved in the interval. Imaging features now similar to prior study of 07/15/2015. Lungs are hyperexpanded. Interstitial markings are diffusely coarsened with chronic features. The cardiopericardial silhouette is within normal limits for size. The visualized bony structures of the thorax are intact. IMPRESSION: Interval resolution of airspace disease with no acute cardiopulmonary findings on today's study. Emphysema. Electronically Signed   By: Kennith Center M.D.   On: 01/07/2016 10:33    Procedures Procedures (including critical care time) CRITICAL CARE Performed by: UEAVW,[email or web address] Total critical care time: 45 minutes Critical care time was exclusive of separately billable procedures and treating other patients. Critical care was necessary to treat or prevent imminent or life-threatening deterioration. Critical care  was time spent personally by me on the following activities: development of treatment plan with patient and/or surrogate as well as nursing, discussions with consultants, evaluation of patient's response to treatment, examination of patient, obtaining history from patient or surrogate, ordering and performing treatments and interventions, ordering and review of laboratory studies, ordering and review of radiographic studies, pulse oximetry and re-evaluation of patient's condition.  Medications Ordered in ED Medications  levalbuterol (XOPENEX) 1.25 MG/0.5ML nebulizer solution (2.5 mg  Given 01/08/16 0303)  sodium chloride 0.9 % bolus 1,000 mL (0 mLs Intravenous Stopped 01/08/16 0512)    And  sodium chloride 0.9 % bolus 1,000 mL (0 mLs Intravenous Stopped 01/08/16 0418)    And  sodium chloride 0.9 % bolus 1,000 mL (1,000 mLs Intravenous New Bag/Given 01/08/16 0418)  ceFEPIme (MAXIPIME) 2 g in dextrose 5 % 50 mL IVPB (0 g Intravenous Stopped 01/08/16 0356)  vancomycin (VANCOCIN) IVPB 1000 mg/200 mL premix (0 mg Intravenous Stopped 01/08/16 0451)     Initial Impression / Assessment and Plan / ED Course  I have reviewed the triage vital signs and the nursing notes.  Pertinent labs & imaging results that were available during my care of the patient were reviewed by me and considered in my medical decision making (see chart for details).  Clinical Course   Cough with fever and chills worrisome for recurrent pneumonia. With tachycardia and tachypnea, he does meet service criteria, so sepsis protocol was initiated. He is empirically started on antibiotics for healthcare associated pneumonia. Old records are reviewed confirming he had been discharged from hospital on September 6 after being hospitalized for community-acquired pneumonia.  X-rays confirm right-sided pneumonia. Lactic acid level is mildly elevated. He is given early goal-directed fluid therapy. Laboratory evaluation also shows mild  hyponatremia. Case is discussed with Dr. Conley Rolls [facility] who agrees to admit the patient. Final Clinical Impressions(s) / ED Diagnoses   Final diagnoses:  HCAP (healthcare-associated pneumonia)  Elevated lactic acid level  Hyponatremia    New Prescriptions New Prescriptions   No medications on file     Dione Booze, MD 01/08/16 [phone]

## 2016-01-09 DIAGNOSIS — J189 Pneumonia, unspecified organism: Secondary | ICD-10-CM

## 2016-01-09 LAB — CBC
HCT: 36.7 % — ABNORMAL LOW (ref 39.0–52.0)
Hemoglobin: 11.8 g/dL — ABNORMAL LOW (ref 13.0–17.0)
MCH: 30.6 pg (ref 26.0–34.0)
MCHC: 32.2 g/dL (ref 30.0–36.0)
MCV: 95.1 fL (ref 78.0–100.0)
PLATELETS: 196 10*3/uL (ref 150–400)
RBC: 3.86 MIL/uL — AB (ref 4.22–5.81)
RDW: 13.1 % (ref 11.5–15.5)
WBC: 14.9 10*3/uL — ABNORMAL HIGH (ref 4.0–10.5)

## 2016-01-09 LAB — URINE CULTURE: Culture: NO GROWTH

## 2016-01-09 LAB — BASIC METABOLIC PANEL
Anion gap: 3 — ABNORMAL LOW (ref 5–15)
BUN: 10 mg/dL (ref 6–20)
CHLORIDE: 101 mmol/L (ref 101–111)
CO2: 30 mmol/L (ref 22–32)
CREATININE: 0.93 mg/dL (ref 0.61–1.24)
Calcium: 8 mg/dL — ABNORMAL LOW (ref 8.9–10.3)
GFR calc non Af Amer: >60 mL/min (ref >60)
Glucose, Bld: 100 mg/dL — ABNORMAL HIGH (ref 65–99)
Potassium: 4 mmol/L (ref 3.5–5.1)
Sodium: 134 mmol/L — ABNORMAL LOW (ref 135–145)

## 2016-01-09 LAB — HIV ANTIBODY (ROUTINE TESTING W REFLEX): HIV Screen 4th Generation wRfx: NONREACTIVE

## 2016-01-09 NOTE — Care Management Note (Signed)
Case Management Note  Patient Details  Name: [name] MRN: [id number] Date of Birth: 1951-01-17  Subjective/Objective:                  Pt admitted pneumonia. He is from home, lives alone and is ind with ADL's. [facility] He reports at home he ambulates independently with no assistive devices. Pt plans to return home with his wife. Pt has had multiple admission for pneumonia this year. [facility]. [facility]. Alroy BailiffLinda Lothian, of [facility], made aware of referral and will obtain pt info from chart.   Action/Plan: Will cont to follow.   Expected Discharge Date:     01/14/2016             Expected Discharge Plan:  Home w Home Health Services  In-House Referral:  NA  Discharge planning Services  CM Consult  Post Acute Care Choice:  Home Health Choice offered to:  Patient  DME Arranged:    DME Agency:     HH Arranged:  RN HH Agency:  [facility]  Status of Service:  In process, will continue to follow  If discussed at Long Length of Stay Meetings, dates discussed:    Additional Comments:  Malcolm MetroChildress, Yashvi Jasinski Demske, RN 01/09/2016, 9:47 AM

## 2016-01-09 NOTE — Progress Notes (Signed)
PROGRESS NOTE    [name]  UJW:[id number] DOB: [birth year] DOA: 01/08/2016 PCP: [name],Edward J., MD     Brief Narrative:  65 y/o man admitted from home on 9/19 with cough, fever and chills. Found to have PNA. Being treated as HCAP as was recently admitted and treated for CAP.   Assessment & Plan:   Active Problems:   Dyslipidemia   COPD (chronic obstructive pulmonary disease) (HCC)   GERD (gastroesophageal reflux disease)   PNA (pneumonia)   HCAP (healthcare-associated pneumonia)   HCAP -Is symptomatically improved today. -Cx data remains negative to date. -MRSA PCR negative: will DC vancomycin. -Will also DC cefepime and keep on levaquin only for now. -Transfer to floor. -Continue to wean oxygen as tolerated. -Hope for DC home in 1-2 days.  Acute on Chronic Hypoxemic Respiratory Failure -Is usually on 2 L. -Continue to wean oxygen as tolerated to his baseline.   DVT prophylaxis: SQ heparin Code Status: full code Family Communication: patient only Disposition Plan: transfer to floor, hope for DC home in 24-48 hours.  Consultants:   None  Procedures:   None  Antimicrobials:   Vancomycin 9/19-->9/20  Cefepime 9/19-->9/20  Levaquin 9/19-->    Subjective: Feels much improved today.  Objective: Vitals:   01/09/16 0723 01/09/16 0745 01/09/16 0800 01/09/16 0900  BP:   116/74 134/80  Pulse:   (!) 101 (!) 107  Resp:   16 19  Temp: 97.3 F (36.3 C)     TempSrc: Oral     SpO2:  95% 97% 96%  Weight:      Height:        Intake/Output Summary (Last 24 hours) at 01/09/16 1156 Last data filed at 01/09/16 0900  Gross per 24 hour  Intake             2465 ml  Output              600 ml  Net             1865 ml   Filed Weights   01/08/16 0244 01/08/16 0603 01/09/16 0500  Weight: 86.2 kg (190 lb) 88 kg (194 lb 0.1 oz) 88.2 kg (194 lb 7.1 oz)    Examination:  General exam: Alert, awake, oriented x 3 Respiratory system: Clear to auscultation.  Respiratory effort normal. Cardiovascular system:RRR. No murmurs, rubs, gallops. Gastrointestinal system: Abdomen is nondistended, soft and nontender. No organomegaly or masses felt. Normal bowel sounds heard. Central nervous system: Alert and oriented. No focal neurological deficits. Extremities: No C/C/E, +pedal pulses Skin: No rashes, lesions or ulcers Psychiatry: Judgement and insight appear normal. Mood & affect appropriate.     Data Reviewed: I have personally reviewed following labs and imaging studies  CBC:  Recent Labs Lab 01/08/16 0250 01/09/16 0451  WBC 11.9* 14.9*  NEUTROABS 10.8*  --   HGB 14.1 11.8*  HCT 42.2 36.7*  MCV 92.1 95.1  PLT 241 196   Basic Metabolic Panel:  Recent Labs Lab 01/08/16 0250 01/09/16 0451  NA 132* 134*  K 4.4 4.0  CL 104 101  CO2 26 30  GLUCOSE 155* 100*  BUN 16 10  CREATININE 1.21 0.93  CALCIUM 7.9* 8.0*   GFR: Estimated Creatinine Clearance: 87 mL/min (by C-G formula based on SCr of 0.93 mg/dL). Liver Function Tests:  Recent Labs Lab 01/08/16 0250  AST 19  ALT 18  ALKPHOS 71  BILITOT 0.9  PROT 6.5  ALBUMIN 3.7   No results for input(s):  LIPASE, AMYLASE in the last 168 hours. No results for input(s): AMMONIA in the last 168 hours. Coagulation Profile: No results for input(s): INR, PROTIME in the last 168 hours. Cardiac Enzymes: No results for input(s): CKTOTAL, CKMB, CKMBINDEX, TROPONINI in the last 168 hours. BNP (last 3 results) No results for input(s): PROBNP in the last 8760 hours. HbA1C: No results for input(s): HGBA1C in the last 72 hours. CBG: No results for input(s): GLUCAP in the last 168 hours. Lipid Profile: No results for input(s): CHOL, HDL, LDLCALC, TRIG, CHOLHDL, LDLDIRECT in the last 72 hours. Thyroid Function Tests: No results for input(s): TSH, T4TOTAL, FREET4, T3FREE, THYROIDAB in the last 72 hours. Anemia Panel: No results for input(s): VITAMINB12, FOLATE, FERRITIN, TIBC, IRON, RETICCTPCT  in the last 72 hours. Urine analysis:    Component Value Date/Time   COLORURINE AMBER (A) 01/08/2016 0312   APPEARANCEUR CLEAR 01/08/2016 0312   LABSPEC >1.030 (H) 01/08/2016 0312   PHURINE 5.5 01/08/2016 0312   GLUCOSEU NEGATIVE 01/08/2016 0312   HGBUR NEGATIVE 01/08/2016 0312   BILIRUBINUR NEGATIVE 01/08/2016 0312   KETONESUR NEGATIVE 01/08/2016 0312   PROTEINUR NEGATIVE 01/08/2016 0312   NITRITE NEGATIVE 01/08/2016 0312   LEUKOCYTESUR NEGATIVE 01/08/2016 0312   Sepsis Labs: @LABRCNTIP (procalcitonin:4,lacticidven:4)  ) Recent Results (from the past 240 hour(s))  Urine culture     Status: None   Collection Time: 01/08/16  3:12 AM  Result Value Ref Range Status   Specimen Description URINE, CLEAN CATCH  Final   Special Requests NONE  Final   Culture NO GROWTH Performed at [facility]   Final   Report Status 01/09/2016 FINAL  Final  Blood Culture (routine x 2)     Status: None (Preliminary result)   Collection Time: 01/08/16  3:21 AM  Result Value Ref Range Status   Specimen Description BLOOD RIGHT HAND  Final   Special Requests   Final    BOTTLES DRAWN AEROBIC AND ANAEROBIC AEB=12CC ANA=10CC   Culture NO GROWTH 1 DAY  Final   Report Status PENDING  Incomplete  Blood Culture (routine x 2)     Status: None (Preliminary result)   Collection Time: 01/08/16  3:28 AM  Result Value Ref Range Status   Specimen Description BLOOD LEFT HAND  Final   Special Requests   Final    BOTTLES DRAWN AEROBIC AND ANAEROBIC AEB=12CC ANA=4CC   Culture NO GROWTH 1 DAY  Final   Report Status PENDING  Incomplete  MRSA PCR Screening     Status: None   Collection Time: 01/08/16  6:05 AM  Result Value Ref Range Status   MRSA by PCR NEGATIVE NEGATIVE Final    Comment:        The GeneXpert MRSA Assay (FDA approved for NASAL specimens only), is one component of a comprehensive MRSA colonization surveillance program. It is not intended to diagnose MRSA infection nor to guide  or monitor treatment for MRSA infections.          Radiology Studies: Dg Chest Port 1 View  Result Date: 01/08/2016 CLINICAL DATA:  Acute onset of vomiting, chills, shortness of breath and generalized body aches. Initial encounter. EXAM: PORTABLE CHEST 1 VIEW COMPARISON:  Chest radiograph performed 01/07/2016 FINDINGS: Fluffy right-sided airspace opacification is noted, concerning for significant pneumonia. The left lung appears relatively clear. No definite pleural effusion or pneumothorax is seen. The cardiomediastinal silhouette remains normal in size. No acute osseous abnormalities are seen. IMPRESSION: Fluffy right-sided airspace opacification, concerning for significant pneumonia. Electronically  Signed   By: Roanna Raider M.D.   On: 01/08/2016 03:50        Scheduled Meds: . diazepam  10 mg Oral TID  . DULoxetine  20 mg Oral Daily  . heparin  5,000 Units Subcutaneous Q8H  . levofloxacin (LEVAQUIN) IV  750 mg Intravenous Q24H  . mometasone-formoterol  2 puff Inhalation BID  . pantoprazole  40 mg Oral Daily  . simvastatin  20 mg Oral q1800   Continuous Infusions: . sodium chloride 100 mL/hr at 01/09/16 0843     LOS: 1 day    Time spent: 25 minutes. Greater than 50% of this time was spent in direct contact with the patient coordinating care.     Chaya Jan, MD [facility] Pager [phone]  If 7PM-7AM, please contact night-coverage [email or web address] Password [email or web address] 01/09/2016, 11:56 AM

## 2016-01-09 NOTE — Progress Notes (Signed)
Called report to RiminiBecky, Charity fundraiserN on dept 300. Verbalized understanding. Pt transferred to room 328 in safe and stable condition.

## 2016-01-09 NOTE — Care Management Important Message (Signed)
Important Message  Patient Details  Name: Fabio NeighborsRoy L Bilski MRN: [id number] Date of Birth: 1950/11/05   Medicare Important Message Given:  Yes    Malcolm MetroChildress, [place] Dirosa Demske, RN 01/09/2016, 9:46 AM

## 2016-01-10 LAB — CBC
HCT: 33.5 % — ABNORMAL LOW (ref 39.0–52.0)
Hemoglobin: 10.8 g/dL — ABNORMAL LOW (ref 13.0–17.0)
MCH: 30.3 pg (ref 26.0–34.0)
MCHC: 32.2 g/dL (ref 30.0–36.0)
MCV: 94.1 fL (ref 78.0–100.0)
PLATELETS: 191 10*3/uL (ref 150–400)
RBC: 3.56 MIL/uL — AB (ref 4.22–5.81)
RDW: 13.3 % (ref 11.5–15.5)
WBC: 12.8 10*3/uL — ABNORMAL HIGH (ref 4.0–10.5)

## 2016-01-10 LAB — BASIC METABOLIC PANEL
Anion gap: 4 — ABNORMAL LOW (ref 5–15)
BUN: 12 mg/dL (ref 6–20)
CALCIUM: 7.9 mg/dL — AB (ref 8.9–10.3)
CHLORIDE: 100 mmol/L — AB (ref 101–111)
CO2: 30 mmol/L (ref 22–32)
CREATININE: 0.84 mg/dL (ref 0.61–1.24)
GFR calc non Af Amer: >60 mL/min (ref >60)
Glucose, Bld: 103 mg/dL — ABNORMAL HIGH (ref 65–99)
Potassium: 4 mmol/L (ref 3.5–5.1)
Sodium: 134 mmol/L — ABNORMAL LOW (ref 135–145)

## 2016-01-10 MED ORDER — LEVOFLOXACIN 750 MG PO TABS: 750.0000 mg | ORAL_TABLET | Freq: Every day | ORAL | 0 refills | Status: DC

## 2016-01-10 NOTE — Discharge Summary (Signed)
Physician Discharge Summary  [name] ZOX:[id number] DOB: 09/28/1950 DOA: 01/08/2016  PCP: Cassell Smiles., MD  Admit date: 01/08/2016 Discharge date: 01/10/2016  Time spent: 45 minutes  Recommendations for Outpatient Follow-up:  -Will be discharged home today. -Advised to follow up with PCP in 2 weeks.   Discharge Diagnoses:  Active Problems:   Dyslipidemia   COPD (chronic obstructive pulmonary disease) (HCC)   GERD (gastroesophageal reflux disease)   PNA (pneumonia)   HCAP (healthcare-associated pneumonia)   Discharge Condition: Stable and improved  Filed Weights   01/08/16 0244 01/08/16 0603 01/09/16 0500  Weight: 86.2 kg (190 lb) 88 kg (194 lb 0.1 oz) 88.2 kg (194 lb 7.1 oz)    History of present illness:  As per Dr. Conley Rolls on 9/19: [name] is an 65 y.o. male with hx HTN, COPD on home oxygen of 2 L [employer], hx of prior tobacco abuse, recently admitted and Tx for CAP, returned to the ER as he developed SOB, myalgia, coughs, and having fever, and chills.  On Sept 5, he had similar symptoms, and was Dx with CAP, Tx and felt better.  He had influenza tested negative at that time.  No distant travel or ill contact.  Evaluation in the ER showed CXR with new infiltrate on the right side suspicious for developing PNA.  He has leukocytosis of 12K, and having normal renal Fx tests.  His lactic acid was slightly elevated to 12, and his BP was solf at 100 systolic.  HR was 130, but he was not in any distress, conversing and mentating well.  He was given IV Zenaida Niece and IV Cefepime, IVF, and hospitalist was asked to admit him for HCAP, respiratory failure, and early sepsis.   Hospital Course:   HCAP -Is symptomatically improved today. -Will DC home on 5 more days of levaquin. -Cx data remains negative to date.  Acute on Chronic Hypoxemic Respiratory Failure -Is usually on 2 L. -Continue to wean oxygen as tolerated to his baseline.   Procedures:  None    Consultations:  None  Discharge Instructions  Discharge Instructions    Diet - low sodium heart healthy    Complete by:  As directed    Increase activity slowly    Complete by:  As directed        Medication List    TAKE these medications   albuterol (2.5 MG/3ML) 0.083% nebulizer solution Commonly known as:  PROVENTIL Take 2.5 mg by nebulization every 4 (four) hours as needed for wheezing or shortness of breath.   albuterol 108 (90 Base) MCG/ACT inhaler Commonly known as:  PROVENTIL HFA;VENTOLIN HFA Inhale 2 puffs into the lungs every 6 (six) hours as needed for wheezing or shortness of breath.   diazepam 10 MG tablet Commonly known as:  VALIUM Take 10 mg by mouth 3 (three) times daily. Reported on 07/15/2015   DULoxetine 20 MG capsule Commonly known as:  CYMBALTA Take 20 mg by mouth daily.   levofloxacin 750 MG tablet Commonly known as:  LEVAQUIN Take 1 tablet (750 mg total) by mouth daily.   magnesium oxide 400 MG tablet Commonly known as:  MAG-OX Take 400 mg by mouth daily.   omeprazole 40 MG capsule Commonly known as:  PRILOSEC Take 40 mg by mouth daily.   oxyCODONE-acetaminophen 7.5-325 MG tablet Commonly known as:  PERCOCET Take 1 tablet by mouth every 4 (four) hours as needed for pain.   simvastatin 20 MG tablet Commonly known as:  ZOCOR TAKE 1 TABLET BY MOUTH DAILY AT 6 PM.   SYMBICORT 160-4.5 MCG/ACT inhaler Generic drug:  budesonide-formoterol Inhale 2 puffs into the lungs 2 (two) times daily.   SYSTANE BALANCE 0.6 % Soln Generic drug:  Propylene Glycol Place 1 drop into both eyes 2 (two) times daily as needed (dry eyes).   VICKS VAPOR INHALER IN Place 1 puff into both nostrils daily as needed (congestion).      Allergies  Allergen Reactions  . Ativan [Lorazepam] Other (See Comments)    Dry mouth sinus and breathing problems    Follow-up Information    [facility] .   Contact information: [address] [place] [address] [phone]            The results of significant diagnostics from this hospitalization (including imaging, microbiology, ancillary and laboratory) are listed below for reference.    Significant Diagnostic Studies: Dg Chest 2 View  Result Date: 01/07/2016 CLINICAL DATA:  Bilateral pneumonia 1 week ago.  No symptoms now. EXAM: CHEST  2 VIEW COMPARISON:  12/25/2015.  07/15/2015 FINDINGS: The relatively diffuse airspace disease seen previously in the right lung has resolved in the interval. Imaging features now similar to prior study of 07/15/2015. Lungs are hyperexpanded. Interstitial markings are diffusely coarsened with chronic features. The cardiopericardial silhouette is within normal limits for size. The visualized bony structures of the thorax are intact. IMPRESSION: Interval resolution of airspace disease with no acute cardiopulmonary findings on today's study. Emphysema. Electronically Signed   By: Kennith CenterEric  Mansell M.D.   On: 01/07/2016 10:33   Dg Chest Port 1 View  Result Date: 01/08/2016 CLINICAL DATA:  Acute onset of vomiting, chills, shortness of breath and generalized body aches. Initial encounter. EXAM: PORTABLE CHEST 1 VIEW COMPARISON:  Chest radiograph performed 01/07/2016 FINDINGS: Fluffy right-sided airspace opacification is noted, concerning for significant pneumonia. The left lung appears relatively clear. No definite pleural effusion or pneumothorax is seen. The cardiomediastinal silhouette remains normal in size. No acute osseous abnormalities are seen. IMPRESSION: Fluffy right-sided airspace opacification, concerning for significant pneumonia. Electronically Signed   By: Roanna RaiderJeffery  Chang M.D.   On: 01/08/2016 03:50   Dg Chest Port 1 View  Result Date: 12/25/2015 CLINICAL DATA:  Acute onset of shortness of breath, fever, body aches and chills. Wheezing and vomiting. Decreased O2 saturation. Initial encounter. EXAM: PORTABLE CHEST 1 VIEW COMPARISON:   Chest radiograph performed 07/15/2015 FINDINGS: The lungs are well-aerated. Diffuse right-sided airspace opacification is concerning for pneumonia. There is no evidence of pleural effusion or pneumothorax. An accessory azygos lobe is incidentally noted. The cardiomediastinal silhouette is borderline normal in size. No acute osseous abnormalities are seen. IMPRESSION: Diffuse right-sided airspace opacification is concerning for pneumonia. Accessory azygos lobe incidentally noted. Electronically Signed   By: Roanna RaiderJeffery  Chang M.D.   On: 12/25/2015 05:45    Microbiology: Recent Results (from the past 240 hour(s))  Urine culture     Status: None   Collection Time: 01/08/16  3:12 AM  Result Value Ref Range Status   Specimen Description URINE, CLEAN CATCH  Final   Special Requests NONE  Final   Culture [facility] GR[facility]TH [facility]   Final   Report Status 01/09/2016 FINAL  Final  Blood Culture (routine x 2)     Status: None (Preliminary result)   Collection Time: 01/08/16  3:21 AM  Result Value Ref Range Status   Specimen Description BLOOD RIGHT HAND  Final   Special  Requests   Final    BOTTLES DRAWN AEROBIC AND ANAEROBIC AEB=12CC ANA=10CC   Culture NO GROWTH 2 DAYS  Final   Report Status PENDING  Incomplete  Blood Culture (routine x 2)     Status: None (Preliminary result)   Collection Time: 01/08/16  3:28 AM  Result Value Ref Range Status   Specimen Description BLOOD LEFT HAND  Final   Special Requests   Final    BOTTLES DRAWN AEROBIC AND ANAEROBIC AEB=12CC ANA=4CC   Culture NO GROWTH 2 DAYS  Final   Report Status PENDING  Incomplete  MRSA PCR Screening     Status: None   Collection Time: 01/08/16  6:05 AM  Result Value Ref Range Status   MRSA by PCR NEGATIVE NEGATIVE Final    Comment:        The GeneXpert MRSA Assay (FDA approved for NASAL specimens only), is one component of a comprehensive MRSA colonization surveillance program. It is not intended to diagnose  MRSA infection nor to guide or monitor treatment for MRSA infections.      Labs: Basic Metabolic Panel:  Recent Labs Lab 01/08/16 0250 01/09/16 0451 01/10/16 0611  NA 132* 134* 134*  K 4.4 4.0 4.0  CL 104 101 100*  CO2 26 30 30   GLUCOSE 155* 100* 103*  BUN 16 10 12   CREATININE 1.21 0.93 0.84  CALCIUM 7.9* 8.0* 7.9*   Liver Function Tests:  Recent Labs Lab 01/08/16 0250  AST 19  ALT 18  ALKPHOS 71  BILITOT 0.9  PROT 6.5  ALBUMIN 3.7   No results for input(s): LIPASE, AMYLASE in the last 168 hours. No results for input(s): AMMONIA in the last 168 hours. CBC:  Recent Labs Lab 01/08/16 0250 01/09/16 0451 01/10/16 0611  WBC 11.9* 14.9* 12.8*  NEUTROABS 10.8*  --   --   HGB 14.1 11.8* 10.8*  HCT 42.2 36.7* 33.5*  MCV 92.1 95.1 94.1  PLT 241 196 191   Cardiac Enzymes: No results for input(s): CKTOTAL, CKMB, CKMBINDEX, TROPONINI in the last 168 hours. BNP: BNP (last 3 results)  Recent Labs  07/03/15 0618  BNP 20.0    ProBNP (last 3 results) No results for input(s): PROBNP in the last 8760 hours.  CBG: No results for input(s): GLUCAP in the last 168 hours.     SignedChaya Jan  [facility] Pager: [phone] 01/10/2016, 4:18 PM

## 2016-01-10 NOTE — Progress Notes (Addendum)
Patient with orders to be discharge home. Discharge instructions given, patient verbalized understanding. Prescriptions given. Patient stable. Patient's O2 sats drop without oxygen.Patient left with portable oxygen in private vehicle with family.

## 2016-01-11 LAB — LEGIONELLA PNEUMOPHILA SEROGP 1 UR AG: L. PNEUMOPHILA SEROGP 1 UR AG: NEGATIVE

## 2016-01-13 LAB — CULTURE, BLOOD (ROUTINE X 2)
CULTURE: NO GROWTH
Culture: NO GROWTH

## 2016-01-14 DIAGNOSIS — I10 Essential (primary) hypertension: Secondary | ICD-10-CM | POA: Diagnosis not present

## 2016-01-14 DIAGNOSIS — J189 Pneumonia, unspecified organism: Secondary | ICD-10-CM | POA: Diagnosis not present

## 2016-01-14 DIAGNOSIS — E785 Hyperlipidemia, unspecified: Secondary | ICD-10-CM | POA: Diagnosis not present

## 2016-01-14 DIAGNOSIS — J449 Chronic obstructive pulmonary disease, unspecified: Secondary | ICD-10-CM | POA: Diagnosis not present

## 2016-01-14 DIAGNOSIS — J440 Chronic obstructive pulmonary disease with acute lower respiratory infection: Secondary | ICD-10-CM | POA: Diagnosis not present

## 2016-01-14 DIAGNOSIS — K219 Gastro-esophageal reflux disease without esophagitis: Secondary | ICD-10-CM | POA: Diagnosis not present

## 2016-01-14 DIAGNOSIS — Z9981 Dependence on supplemental oxygen: Secondary | ICD-10-CM | POA: Diagnosis not present

## 2016-01-14 DIAGNOSIS — Z87891 Personal history of nicotine dependence: Secondary | ICD-10-CM | POA: Diagnosis not present

## 2016-01-15 DIAGNOSIS — E785 Hyperlipidemia, unspecified: Secondary | ICD-10-CM | POA: Diagnosis not present

## 2016-01-15 DIAGNOSIS — K219 Gastro-esophageal reflux disease without esophagitis: Secondary | ICD-10-CM | POA: Diagnosis not present

## 2016-01-15 DIAGNOSIS — J189 Pneumonia, unspecified organism: Secondary | ICD-10-CM | POA: Diagnosis not present

## 2016-01-15 DIAGNOSIS — J440 Chronic obstructive pulmonary disease with acute lower respiratory infection: Secondary | ICD-10-CM | POA: Diagnosis not present

## 2016-01-15 DIAGNOSIS — J449 Chronic obstructive pulmonary disease, unspecified: Secondary | ICD-10-CM | POA: Diagnosis not present

## 2016-01-15 DIAGNOSIS — I10 Essential (primary) hypertension: Secondary | ICD-10-CM | POA: Diagnosis not present

## 2016-01-15 DIAGNOSIS — Z87891 Personal history of nicotine dependence: Secondary | ICD-10-CM | POA: Diagnosis not present

## 2016-01-15 DIAGNOSIS — Z9981 Dependence on supplemental oxygen: Secondary | ICD-10-CM | POA: Diagnosis not present

## 2016-01-17 DIAGNOSIS — E785 Hyperlipidemia, unspecified: Secondary | ICD-10-CM | POA: Diagnosis not present

## 2016-01-17 DIAGNOSIS — J440 Chronic obstructive pulmonary disease with acute lower respiratory infection: Secondary | ICD-10-CM | POA: Diagnosis not present

## 2016-01-17 DIAGNOSIS — J189 Pneumonia, unspecified organism: Secondary | ICD-10-CM | POA: Diagnosis not present

## 2016-01-17 DIAGNOSIS — Z87891 Personal history of nicotine dependence: Secondary | ICD-10-CM | POA: Diagnosis not present

## 2016-01-17 DIAGNOSIS — K219 Gastro-esophageal reflux disease without esophagitis: Secondary | ICD-10-CM | POA: Diagnosis not present

## 2016-01-17 DIAGNOSIS — I10 Essential (primary) hypertension: Secondary | ICD-10-CM | POA: Diagnosis not present

## 2016-01-17 DIAGNOSIS — J449 Chronic obstructive pulmonary disease, unspecified: Secondary | ICD-10-CM | POA: Diagnosis not present

## 2016-01-17 DIAGNOSIS — Z9981 Dependence on supplemental oxygen: Secondary | ICD-10-CM | POA: Diagnosis not present

## 2016-01-21 DIAGNOSIS — J449 Chronic obstructive pulmonary disease, unspecified: Secondary | ICD-10-CM | POA: Diagnosis not present

## 2016-01-21 DIAGNOSIS — K219 Gastro-esophageal reflux disease without esophagitis: Secondary | ICD-10-CM | POA: Diagnosis not present

## 2016-01-21 DIAGNOSIS — I10 Essential (primary) hypertension: Secondary | ICD-10-CM | POA: Diagnosis not present

## 2016-01-21 DIAGNOSIS — E785 Hyperlipidemia, unspecified: Secondary | ICD-10-CM | POA: Diagnosis not present

## 2016-01-21 DIAGNOSIS — Z9981 Dependence on supplemental oxygen: Secondary | ICD-10-CM | POA: Diagnosis not present

## 2016-01-21 DIAGNOSIS — J189 Pneumonia, unspecified organism: Secondary | ICD-10-CM | POA: Diagnosis not present

## 2016-01-21 DIAGNOSIS — J440 Chronic obstructive pulmonary disease with acute lower respiratory infection: Secondary | ICD-10-CM | POA: Diagnosis not present

## 2016-01-21 DIAGNOSIS — Z87891 Personal history of nicotine dependence: Secondary | ICD-10-CM | POA: Diagnosis not present

## 2016-01-23 DIAGNOSIS — E119 Type 2 diabetes mellitus without complications: Secondary | ICD-10-CM | POA: Diagnosis not present

## 2016-01-23 DIAGNOSIS — K219 Gastro-esophageal reflux disease without esophagitis: Secondary | ICD-10-CM | POA: Diagnosis not present

## 2016-01-23 DIAGNOSIS — J449 Chronic obstructive pulmonary disease, unspecified: Secondary | ICD-10-CM | POA: Diagnosis not present

## 2016-01-23 DIAGNOSIS — Z6826 Body mass index (BMI) 26.0-26.9, adult: Secondary | ICD-10-CM | POA: Diagnosis not present

## 2016-01-24 DIAGNOSIS — I10 Essential (primary) hypertension: Secondary | ICD-10-CM | POA: Diagnosis not present

## 2016-01-24 DIAGNOSIS — Z9981 Dependence on supplemental oxygen: Secondary | ICD-10-CM | POA: Diagnosis not present

## 2016-01-24 DIAGNOSIS — K219 Gastro-esophageal reflux disease without esophagitis: Secondary | ICD-10-CM | POA: Diagnosis not present

## 2016-01-24 DIAGNOSIS — Z87891 Personal history of nicotine dependence: Secondary | ICD-10-CM | POA: Diagnosis not present

## 2016-01-24 DIAGNOSIS — J440 Chronic obstructive pulmonary disease with acute lower respiratory infection: Secondary | ICD-10-CM | POA: Diagnosis not present

## 2016-01-24 DIAGNOSIS — E785 Hyperlipidemia, unspecified: Secondary | ICD-10-CM | POA: Diagnosis not present

## 2016-01-24 DIAGNOSIS — J189 Pneumonia, unspecified organism: Secondary | ICD-10-CM | POA: Diagnosis not present

## 2016-01-24 DIAGNOSIS — J449 Chronic obstructive pulmonary disease, unspecified: Secondary | ICD-10-CM | POA: Diagnosis not present

## 2016-01-30 DIAGNOSIS — Z9981 Dependence on supplemental oxygen: Secondary | ICD-10-CM | POA: Diagnosis not present

## 2016-01-30 DIAGNOSIS — J440 Chronic obstructive pulmonary disease with acute lower respiratory infection: Secondary | ICD-10-CM | POA: Diagnosis not present

## 2016-01-30 DIAGNOSIS — J449 Chronic obstructive pulmonary disease, unspecified: Secondary | ICD-10-CM | POA: Diagnosis not present

## 2016-01-30 DIAGNOSIS — J189 Pneumonia, unspecified organism: Secondary | ICD-10-CM | POA: Diagnosis not present

## 2016-01-30 DIAGNOSIS — E785 Hyperlipidemia, unspecified: Secondary | ICD-10-CM | POA: Diagnosis not present

## 2016-01-30 DIAGNOSIS — Z87891 Personal history of nicotine dependence: Secondary | ICD-10-CM | POA: Diagnosis not present

## 2016-01-30 DIAGNOSIS — I10 Essential (primary) hypertension: Secondary | ICD-10-CM | POA: Diagnosis not present

## 2016-01-30 DIAGNOSIS — K219 Gastro-esophageal reflux disease without esophagitis: Secondary | ICD-10-CM | POA: Diagnosis not present

## 2016-01-31 DIAGNOSIS — J441 Chronic obstructive pulmonary disease with (acute) exacerbation: Secondary | ICD-10-CM | POA: Diagnosis not present

## 2016-02-05 DIAGNOSIS — K219 Gastro-esophageal reflux disease without esophagitis: Secondary | ICD-10-CM | POA: Diagnosis not present

## 2016-02-05 DIAGNOSIS — J449 Chronic obstructive pulmonary disease, unspecified: Secondary | ICD-10-CM | POA: Diagnosis not present

## 2016-02-05 DIAGNOSIS — E785 Hyperlipidemia, unspecified: Secondary | ICD-10-CM | POA: Diagnosis not present

## 2016-02-05 DIAGNOSIS — Z87891 Personal history of nicotine dependence: Secondary | ICD-10-CM | POA: Diagnosis not present

## 2016-02-05 DIAGNOSIS — I10 Essential (primary) hypertension: Secondary | ICD-10-CM | POA: Diagnosis not present

## 2016-02-05 DIAGNOSIS — Z9981 Dependence on supplemental oxygen: Secondary | ICD-10-CM | POA: Diagnosis not present

## 2016-02-05 DIAGNOSIS — J189 Pneumonia, unspecified organism: Secondary | ICD-10-CM | POA: Diagnosis not present

## 2016-02-05 DIAGNOSIS — J440 Chronic obstructive pulmonary disease with acute lower respiratory infection: Secondary | ICD-10-CM | POA: Diagnosis not present

## 2016-02-06 DIAGNOSIS — J441 Chronic obstructive pulmonary disease with (acute) exacerbation: Secondary | ICD-10-CM | POA: Diagnosis not present

## 2016-02-14 DIAGNOSIS — J440 Chronic obstructive pulmonary disease with acute lower respiratory infection: Secondary | ICD-10-CM | POA: Diagnosis not present

## 2016-02-14 DIAGNOSIS — K219 Gastro-esophageal reflux disease without esophagitis: Secondary | ICD-10-CM | POA: Diagnosis not present

## 2016-02-14 DIAGNOSIS — Z87891 Personal history of nicotine dependence: Secondary | ICD-10-CM | POA: Diagnosis not present

## 2016-02-14 DIAGNOSIS — J449 Chronic obstructive pulmonary disease, unspecified: Secondary | ICD-10-CM | POA: Diagnosis not present

## 2016-02-14 DIAGNOSIS — I10 Essential (primary) hypertension: Secondary | ICD-10-CM | POA: Diagnosis not present

## 2016-02-14 DIAGNOSIS — Z9981 Dependence on supplemental oxygen: Secondary | ICD-10-CM | POA: Diagnosis not present

## 2016-02-14 DIAGNOSIS — J189 Pneumonia, unspecified organism: Secondary | ICD-10-CM | POA: Diagnosis not present

## 2016-02-14 DIAGNOSIS — E785 Hyperlipidemia, unspecified: Secondary | ICD-10-CM | POA: Diagnosis not present

## 2016-02-19 DIAGNOSIS — I10 Essential (primary) hypertension: Secondary | ICD-10-CM | POA: Diagnosis not present

## 2016-02-19 DIAGNOSIS — J449 Chronic obstructive pulmonary disease, unspecified: Secondary | ICD-10-CM | POA: Diagnosis not present

## 2016-02-19 DIAGNOSIS — K219 Gastro-esophageal reflux disease without esophagitis: Secondary | ICD-10-CM | POA: Diagnosis not present

## 2016-02-19 DIAGNOSIS — Z87891 Personal history of nicotine dependence: Secondary | ICD-10-CM | POA: Diagnosis not present

## 2016-02-19 DIAGNOSIS — Z9981 Dependence on supplemental oxygen: Secondary | ICD-10-CM | POA: Diagnosis not present

## 2016-02-19 DIAGNOSIS — J189 Pneumonia, unspecified organism: Secondary | ICD-10-CM | POA: Diagnosis not present

## 2016-02-19 DIAGNOSIS — J440 Chronic obstructive pulmonary disease with acute lower respiratory infection: Secondary | ICD-10-CM | POA: Diagnosis not present

## 2016-02-19 DIAGNOSIS — E785 Hyperlipidemia, unspecified: Secondary | ICD-10-CM | POA: Diagnosis not present

## 2016-02-27 DIAGNOSIS — I10 Essential (primary) hypertension: Secondary | ICD-10-CM | POA: Diagnosis not present

## 2016-02-27 DIAGNOSIS — J440 Chronic obstructive pulmonary disease with acute lower respiratory infection: Secondary | ICD-10-CM | POA: Diagnosis not present

## 2016-02-27 DIAGNOSIS — J189 Pneumonia, unspecified organism: Secondary | ICD-10-CM | POA: Diagnosis not present

## 2016-02-27 DIAGNOSIS — E785 Hyperlipidemia, unspecified: Secondary | ICD-10-CM | POA: Diagnosis not present

## 2016-02-27 DIAGNOSIS — J449 Chronic obstructive pulmonary disease, unspecified: Secondary | ICD-10-CM | POA: Diagnosis not present

## 2016-02-27 DIAGNOSIS — Z9981 Dependence on supplemental oxygen: Secondary | ICD-10-CM | POA: Diagnosis not present

## 2016-02-27 DIAGNOSIS — K219 Gastro-esophageal reflux disease without esophagitis: Secondary | ICD-10-CM | POA: Diagnosis not present

## 2016-02-27 DIAGNOSIS — Z87891 Personal history of nicotine dependence: Secondary | ICD-10-CM | POA: Diagnosis not present

## 2016-03-02 DIAGNOSIS — J441 Chronic obstructive pulmonary disease with (acute) exacerbation: Secondary | ICD-10-CM | POA: Diagnosis not present

## 2016-03-07 DIAGNOSIS — K219 Gastro-esophageal reflux disease without esophagitis: Secondary | ICD-10-CM | POA: Diagnosis not present

## 2016-03-07 DIAGNOSIS — E785 Hyperlipidemia, unspecified: Secondary | ICD-10-CM | POA: Diagnosis not present

## 2016-03-07 DIAGNOSIS — Z9981 Dependence on supplemental oxygen: Secondary | ICD-10-CM | POA: Diagnosis not present

## 2016-03-07 DIAGNOSIS — I10 Essential (primary) hypertension: Secondary | ICD-10-CM | POA: Diagnosis not present

## 2016-03-07 DIAGNOSIS — J189 Pneumonia, unspecified organism: Secondary | ICD-10-CM | POA: Diagnosis not present

## 2016-03-07 DIAGNOSIS — J449 Chronic obstructive pulmonary disease, unspecified: Secondary | ICD-10-CM | POA: Diagnosis not present

## 2016-03-07 DIAGNOSIS — Z87891 Personal history of nicotine dependence: Secondary | ICD-10-CM | POA: Diagnosis not present

## 2016-03-07 DIAGNOSIS — J440 Chronic obstructive pulmonary disease with acute lower respiratory infection: Secondary | ICD-10-CM | POA: Diagnosis not present

## 2016-03-08 DIAGNOSIS — J441 Chronic obstructive pulmonary disease with (acute) exacerbation: Secondary | ICD-10-CM | POA: Diagnosis not present

## 2016-03-10 DIAGNOSIS — Z9981 Dependence on supplemental oxygen: Secondary | ICD-10-CM | POA: Diagnosis not present

## 2016-03-10 DIAGNOSIS — J449 Chronic obstructive pulmonary disease, unspecified: Secondary | ICD-10-CM | POA: Diagnosis not present

## 2016-03-10 DIAGNOSIS — J440 Chronic obstructive pulmonary disease with acute lower respiratory infection: Secondary | ICD-10-CM | POA: Diagnosis not present

## 2016-03-10 DIAGNOSIS — E785 Hyperlipidemia, unspecified: Secondary | ICD-10-CM | POA: Diagnosis not present

## 2016-03-10 DIAGNOSIS — K219 Gastro-esophageal reflux disease without esophagitis: Secondary | ICD-10-CM | POA: Diagnosis not present

## 2016-03-10 DIAGNOSIS — Z87891 Personal history of nicotine dependence: Secondary | ICD-10-CM | POA: Diagnosis not present

## 2016-03-10 DIAGNOSIS — I10 Essential (primary) hypertension: Secondary | ICD-10-CM | POA: Diagnosis not present

## 2016-03-10 DIAGNOSIS — J189 Pneumonia, unspecified organism: Secondary | ICD-10-CM | POA: Diagnosis not present

## 2016-03-26 ENCOUNTER — Ambulatory Visit: Payer: Commercial Managed Care - HMO | Attending: Pain Medicine | Admitting: Pain Medicine

## 2016-03-26 ENCOUNTER — Ambulatory Visit: Payer: Commercial Managed Care - HMO | Admitting: Pain Medicine

## 2016-03-26 ENCOUNTER — Ambulatory Visit
Admission: RE | Admit: 2016-03-26 | Discharge: 2016-03-26 | Disposition: A | Discharge status: Home / Self Care | Payer: Commercial Managed Care - HMO | Source: Ambulatory Visit | Attending: Pain Medicine | Admitting: Pain Medicine

## 2016-03-26 ENCOUNTER — Encounter: Payer: Self-pay | Admitting: Pain Medicine

## 2016-03-26 ENCOUNTER — Other Ambulatory Visit
Admission: RE | Admit: 2016-03-26 | Discharge: 2016-03-26 | Disposition: A | Discharge status: Home / Self Care | Payer: Commercial Managed Care - HMO | Source: Ambulatory Visit | Attending: Pain Medicine | Admitting: Pain Medicine

## 2016-03-26 VITALS — BP 124/97 | HR 78 | Temp 97.8°F | Resp 16 | Ht 69.0 in | Wt 183.0 lb

## 2016-03-26 DIAGNOSIS — G8929 Other chronic pain: Secondary | ICD-10-CM | POA: Diagnosis not present

## 2016-03-26 DIAGNOSIS — M545 Low back pain, unspecified: Secondary | ICD-10-CM

## 2016-03-26 DIAGNOSIS — M25559 Pain in unspecified hip: Secondary | ICD-10-CM

## 2016-03-26 DIAGNOSIS — M47816 Spondylosis without myelopathy or radiculopathy, lumbar region: Secondary | ICD-10-CM | POA: Diagnosis not present

## 2016-03-26 DIAGNOSIS — M160 Bilateral primary osteoarthritis of hip: Secondary | ICD-10-CM | POA: Diagnosis not present

## 2016-03-26 DIAGNOSIS — F1190 Opioid use, unspecified, uncomplicated: Secondary | ICD-10-CM

## 2016-03-26 DIAGNOSIS — Z9889 Other specified postprocedural states: Secondary | ICD-10-CM | POA: Insufficient documentation

## 2016-03-26 DIAGNOSIS — J449 Chronic obstructive pulmonary disease, unspecified: Secondary | ICD-10-CM | POA: Insufficient documentation

## 2016-03-26 DIAGNOSIS — Z5181 Encounter for therapeutic drug level monitoring: Secondary | ICD-10-CM

## 2016-03-26 DIAGNOSIS — M25552 Pain in left hip: Secondary | ICD-10-CM | POA: Insufficient documentation

## 2016-03-26 DIAGNOSIS — G894 Chronic pain syndrome: Secondary | ICD-10-CM | POA: Diagnosis not present

## 2016-03-26 DIAGNOSIS — M549 Dorsalgia, unspecified: Secondary | ICD-10-CM | POA: Diagnosis not present

## 2016-03-26 DIAGNOSIS — I700 Atherosclerosis of aorta: Secondary | ICD-10-CM | POA: Diagnosis not present

## 2016-03-26 DIAGNOSIS — M533 Sacrococcygeal disorders, not elsewhere classified: Secondary | ICD-10-CM

## 2016-03-26 DIAGNOSIS — Z5189 Encounter for other specified aftercare: Secondary | ICD-10-CM | POA: Diagnosis not present

## 2016-03-26 DIAGNOSIS — Z79891 Long term (current) use of opiate analgesic: Secondary | ICD-10-CM | POA: Insufficient documentation

## 2016-03-26 DIAGNOSIS — M25551 Pain in right hip: Secondary | ICD-10-CM

## 2016-03-26 DIAGNOSIS — Z0189 Encounter for other specified special examinations: Secondary | ICD-10-CM | POA: Insufficient documentation

## 2016-03-26 LAB — SEDIMENTATION RATE: SED RATE: 10 mm/h (ref 0–20)

## 2016-03-26 LAB — BRAIN NATRIURETIC PEPTIDE: B Natriuretic Peptide: 33 pg/mL (ref 0.0–100.0)

## 2016-03-26 LAB — COMPREHENSIVE METABOLIC PANEL
ALBUMIN: 4.2 g/dL (ref 3.5–5.0)
ALT: 21 U/L (ref 17–63)
ANION GAP: 8 (ref 5–15)
AST: 23 U/L (ref 15–41)
Alkaline Phosphatase: 111 U/L (ref 38–126)
BUN: 10 mg/dL (ref 6–20)
CALCIUM: 9.3 mg/dL (ref 8.9–10.3)
CO2: 26 mmol/L (ref 22–32)
Chloride: 108 mmol/L (ref 101–111)
Creatinine, Ser: 0.98 mg/dL (ref 0.61–1.24)
GFR calc non Af Amer: >60 mL/min (ref >60)
GLUCOSE: 93 mg/dL (ref 65–99)
Potassium: 4 mmol/L (ref 3.5–5.1)
SODIUM: 142 mmol/L (ref 135–145)
Total Bilirubin: 0.7 mg/dL (ref 0.3–1.2)
Total Protein: 7.4 g/dL (ref 6.5–8.1)

## 2016-03-26 LAB — VITAMIN B12: Vitamin B-12: 246 pg/mL (ref 180–914)

## 2016-03-26 LAB — C-REACTIVE PROTEIN: CRP: 1.7 mg/dL — ABNORMAL HIGH (ref <1.0)

## 2016-03-26 LAB — MAGNESIUM: MAGNESIUM: 2 mg/dL (ref 1.7–2.4)

## 2016-03-26 NOTE — Progress Notes (Signed)
Safety precautions to be maintained throughout the outpatient stay will include: orient to surroundings, keep bed in low position, maintain call bell within reach at all times, provide assistance with transfer out of bed and ambulation.  

## 2016-03-26 NOTE — Progress Notes (Signed)
Patient's Name: [name]  MRN: [id number]  Referring Provider: Redmond School, MD  DOB: 1950-05-06  PCP: Redmond School, MD  DOS: 03/26/2016  Note by: Kathlen Brunswick. Dossie Arbour, MD  Service setting: Ambulatory outpatient  Specialty: Interventional Pain Management  Location: [facility] (AMB) Pain Management Facility    Patient type: New Patient   Primary Reason(s) for Visit: Initial Patient Evaluation CC: Back Pain (low)  HPI  [name] is a 65 y.o. year old, male patient, who comes today for an initial evaluation. He has SHOULDER PAIN; SHOULDER IMPINGEMENT SYNDROME, LEFT; Dyslipidemia; COPD (chronic obstructive pulmonary disease) ([place]); Coronary artery disease; Anxiety; Sepsis ([place]); CAP (community acquired pneumonia); Acute respiratory failure with hypoxia (HCC); GERD (gastroesophageal reflux disease); COPD exacerbation ([place]); PNA (pneumonia); Chronic respiratory failure with hypoxia (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 His primarily concern today is the Back Pain (low)  Pain Assessment: Self-Reported Pain Score: 7  (pain scale information given)/10 Clinically the patient looks like a 1/10 Reported level is inconsistent with clinical observations. Information on the proper use of the pain score provided to the patient today. Pain Type: Chronic pain Pain Location: Back Pain Orientation: Lower Pain Descriptors / Indicators: Aching, Sharp, Stabbing Pain Frequency: Intermittent  Onset and Duration: Gradual, Date of onset: 2007 and Present  longer than 3 months Cause of pain: Work related accident or event Severity: No change since onset, NAS-11 at its worse: 9/10, NAS-11 at its best: 6/10, NAS-11 now: 7/10 and NAS-11 on the average: 7/10 Timing: During activity or exercise Aggravating Factors: Bending, Kneeling, Lifiting, Squatting, Stooping  and Working Alleviating Factors: Lying down and Resting Associated Problems: Day-time cramps, Night-time cramps, Depression, Inability to concentrate and Spasms Quality of Pain: Aching, Burning, Nagging, Sharp, Shooting, Stabbing, Throbbing, Uncomfortable and Work related Previous Examinations or Tests: X-rays Previous Treatments: Narcotic medications  The patient comes into the clinics today for the first time for a chronic pain management evaluation. His primary pain is in the center of the lower back, however with hyperextension and rotation he is experiencing pain ipsilateral to the side that he rotates into. No pain referral to the buttocks or lower extremities.  Today I took the time to provide the patient with information regarding my pain practice. The patient was informed that my practice is divided into two sections: an interventional pain management section, as well as a completely separate and distinct medication management section. The interventional portion of my practice takes place on Tuesdays and Thursdays, while the medication management is conducted on Mondays and Wednesdays. Because of the amount of documentation required on both them, they are kept separated. This means that there is the possibility that the patient may be scheduled for a procedure on Tuesday, while also having a medication management appointment on Wednesday. I have also informed the patient that because of current staffing and facility limitations, I no longer take patients for medication management only. To illustrate the reasons for this, I gave the patient the example of a surgeon and how inappropriate it  would be to refer a patient to his/her practice so that they write for the post-procedure  antibiotics on a surgery done by someone else.   The patient was informed that joining my practice means that they are open to any and all interventional therapies. I clarified for the patient that this does not mean that they will be forced to have any procedures done. What it means is that patients looking for a practitioner to simply write for their pain medications and not take advantage of other interventional techniques will be better served by a different practitioner, other than myself. I made it clear that I prefer to spend my time providing those services that I specialize in.  The patient was also made aware of my Comprehensive Pain Management Safety Guidelines where by joining my practice, they limit all of their nerve blocks and joint injections to those done by our practice, for as long as we are retained to manage their care.   Historic Controlled Substance Pharmacotherapy Review  PMP and historical list of controlled substances: Diazepam 10 mg 3 times a day; oxycodone/APAP 7.5/325 one every 4 hours; hydrocodone/APAP 10/325 one every 4 hours; oxycodone/APAP 10/325 2 every 4 hours. Highest analgesic regimen found: Oxycodone/APAP 10/325 2 tablets every 4 hours (120 mg/day of oxycodone) Most recent analgesic: Oxycodone/APAP 7.5/325 one cue 4 hours (45 mg/day of oxycodone) (last filled on 03/04/2016) (prescribed by: Glo Herring MD; [place], [address] [place], [place] [address]) Highest recorded MME/day: 180 mg/day MME/day: 67.5 mg/day Medications: The patient did not bring the medication(s) to the appointment, as requested in our "New Patient Package" Pharmacodynamics: Desired effects: Analgesia: The patient reports >50% benefit. Reported improvement in function: The patient reports medication allows him to accomplish basic ADLs. Clinically meaningful  improvement in function (CMIF): Sustained CMIF goals met Perceived effectiveness: Described as relatively effective, allowing for increase in activities of daily living (ADL) Undesirable effects: Side-effects or Adverse reactions: None reported Historical Monitoring: The patient  reports that he does not use drugs.. No results found for: MDMA, COCAINSCRNUR, PCPSCRNUR, THCU, ETH Historical Background Evaluation: [place] PDMP: Six (6) year initial data search conducted.             [place] Department of public safety, offender search: Editor, commissioning Information) Non-contributory Risk Assessment Profile: Aberrant behavior: None observed or detected today Risk factors for fatal opioid overdose: None identified today Fatal overdose hazard ratio (HR): Calculation deferred Non-fatal overdose hazard ratio (HR): Calculation deferred Risk of opioid abuse or dependence: 0.7-3.0% with doses ? 36 MME/day and 6.1-26% with doses ? 120 MME/day. Substance use disorder (SUD) risk level: Pending results of Medical Psychology Evaluation for SUD Opioid risk tool (ORT) (Total Score): 4  ORT Scoring interpretation table:  Score <3 = Low Risk for SUD  Score between 4-7 = Moderate Risk for SUD  Score >8 = High Risk for Opioid Abuse   PHQ-2 Depression Scale:  Total score: 0  PHQ-2 Scoring interpretation table: (Score and probability of major depressive disorder)  Score 0 = No depression  Score 1 = 15.4% Probability  Score 2 = 21.1% Probability  Score 3 = 38.4% Probability  Score 4 = 45.5% Probability  Score 5 = 56.4% Probability  Score 6 = 78.6% Probability   PHQ-9 Depression Scale:  Total score: 0  PHQ-9 Scoring interpretation table:  Score 0-4 = No depression  Score 5-9 = Mild depression  Score 10-14 = Moderate depression  Score 15-19 = Moderately severe depression  Score 20-27 = Severe depression (2.4 times higher risk of SUD and 2.89  times higher risk of overuse)   Pharmacologic Plan: Pending ordered tests  and/or consults  Meds  The patient has a current medication list which includes the following prescription(s): albuterol, albuterol, aromatic inhalants, diazepam, duloxetine, lactulose, omeprazole, oxycodone-acetaminophen, propylene glycol, simvastatin, symbicort, tizanidine, and levofloxacin.  Current Outpatient Prescriptions on File Prior to Visit  Medication Sig  . albuterol (PROVENTIL HFA;VENTOLIN HFA) 108 (90 BASE) MCG/ACT inhaler Inhale 2 puffs into the lungs every 6 (six) hours as needed for wheezing or shortness of breath.  Marland Kitchen albuterol (PROVENTIL) (2.5 MG/3ML) 0.083% nebulizer solution Take 2.5 mg by nebulization every 4 (four) hours as needed for wheezing or shortness of breath.  . Aromatic Inhalants (VICKS VAPOR INHALER IN) Place 1 puff into both nostrils daily as needed (congestion).  . diazepam (VALIUM) 10 MG tablet Take 10 mg by mouth 3 (three) times daily. Reported on 07/15/2015  . DULoxetine (CYMBALTA) 20 MG capsule Take 20 mg by mouth daily.  Marland Kitchen omeprazole (PRILOSEC) 40 MG capsule Take 40 mg by mouth daily.   Marland Kitchen oxyCODONE-acetaminophen (PERCOCET) 7.5-325 MG per tablet Take 1 tablet by mouth every 4 (four) hours as needed for pain.  Marland Kitchen Propylene Glycol (SYSTANE BALANCE) 0.6 % SOLN Place 1 drop into both eyes 2 (two) times daily as needed (dry eyes).  . simvastatin (ZOCOR) 20 MG tablet TAKE 1 TABLET BY MOUTH DAILY AT 6 PM.  . SYMBICORT 160-4.5 MCG/ACT inhaler Inhale 2 puffs into the lungs 2 (two) times daily.   No current facility-administered medications on file prior to visit.    Imaging Review  Lumbosacral Imaging: Lumbar DG (Complete) 4+V:  Results for orders placed during the hospital encounter of 02/08/09  DG Lumbar Spine Complete   Narrative Clinical Data: Low back pain.   LUMBAR SPINE - COMPLETE 4+ VIEW   Comparison: None   Findings: Vertebral body osteophytes at multiple levels.  Disc space narrowing on the right at L4-5 with prominent osteophytic formation.  No  spondylolisthesis.   IMPRESSION: Spondylosis.  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  Provider: Briscoe Burns   Note: Available results from prior imaging studies were reviewed.        ROS  Cardiovascular History: Negative for hypertension, coronary artery diseas, myocardial infraction, anticoagulant therapy or heart failure Pulmonary or Respiratory History: Lung problems, Emphysema, Shortness of breath, Snoring  and Bronchitis Neurological History: Negative for epilepsy, stroke, urinary or fecal inontinence, spina bifida or tethered cord syndrome Review of Past Neurological Studies: No results found for this or any previous visit. Psychological-Psychiatric History: Anxiety, Depression and Panic Attacks Gastrointestinal History: Ulcers and Reflux or heatburn Genitourinary History: Negative for nephrolithiasis, hematuria, renal failure or chronic kidney disease Hematological History: Brusing easily Endocrine History: Negative for diabetes or thyroid disease Rheumatologic History: Negative for lupus, osteoarthritis, rheumatoid arthritis, myositis, polymyositis or fibromyagia Musculoskeletal History: Negative for myasthenia gravis, muscular dystrophy, multiple sclerosis or malignant hyperthermia Work History: Disabled and Quit going to work on his/her own  Allergies  Mr. Faulcon is allergic to ativan [lorazepam].  Laboratory Chemistry  Inflammation Markers Lab Results  Component Value Date   ESRSEDRATE 10 03/26/2016   Renal Function Lab Results  Component Value Date   BUN 10 03/26/2016   CREATININE 0.98 03/26/2016   GFRAA >60 03/26/2016   GFRNONAA >60 03/26/2016   Hepatic Function Lab Results  Component Value Date   AST 23 03/26/2016   ALT 21 03/26/2016   ALBUMIN 4.2 03/26/2016   Electrolytes Lab Results  Component Value Date   NA 142 03/26/2016  K 4.0 03/26/2016   CL 108 03/26/2016   CALCIUM 9.3 03/26/2016   MG 2.0 03/26/2016   Pain Modulating Vitamins No results found for: Maralyn Sago  [id number], [id number], [id number], 25OHVITD1, 25OHVITD2, 25OHVITD3, VITAMINB12 Coagulation Parameters Lab Results  Component Value Date   PLT 191 01/10/2016   Cardiovascular Lab Results  Component Value Date   BNP 33.0 03/26/2016   HGB 10.8 (L) 01/10/2016   HCT 33.5 (L) 01/10/2016   Note: Lab results reviewed.  PFSH  Drug: Mr. Kielty  reports that he does not use drugs. Alcohol:  reports that he does not drink alcohol. Tobacco:  reports that he quit smoking about 8 years ago. He started smoking about 41 years ago. He has never used smokeless tobacco. Medical:  has a past medical history of Anxiety; CAD (coronary artery disease); Chronic pain; COPD (chronic obstructive pulmonary disease) ([place]); Dyslipidemia; History of tobacco abuse; Hypertension; Opioid dependence ([place]); and Respiratory failure ([place]). Family: family history includes Leukemia in his mother; Suicidality in his father.  Past Surgical History:  Procedure Laterality Date  . CARDIAC CATHETERIZATION  08/10/2008   right & left - normal coronaries (Dr. Norlene Duel)  . NM MYOCAR PERF WALL MOTION  07/2008   bruce myoview - mild ischemia in basal inferoseptal & mid inferoseptal regions; EF 63%  . TRANSTHORACIC ECHOCARDIOGRAM  07/2008   EF=>55%; RV mildly dilated; RA mild-mod dilated; mild mitral annular calcification & mild MR; AV mildly sclerotic   Active Ambulatory Problems    Diagnosis Date Noted  . SHOULDER PAIN 08/16/2008  . SHOULDER IMPINGEMENT SYNDROME, LEFT 08/16/2008  . Dyslipidemia 06/23/2013  . COPD (chronic obstructive pulmonary disease) ([place]) 06/23/2013  . Coronary artery disease 10/13/2013  . Anxiety 10/13/2013  . Sepsis ([place]) 07/03/2015  . CAP (community acquired pneumonia) 07/03/2015  . Acute respiratory failure with hypoxia ([place]) 07/03/2015  . GERD (gastroesophageal reflux disease) 07/03/2015  . COPD exacerbation ([place])   . PNA (pneumonia) 12/25/2015  . Chronic respiratory failure with hypoxia ([place])  12/25/2015  . HCAP (healthcare-associated pneumonia) 01/08/2016  . Chronic pain syndrome 03/26/2016  . Chronic bilateral low back pain without sciatica (R>L) 03/26/2016  . Long term current use of opiate analgesic 03/26/2016  . Long term prescription opiate use 03/26/2016  . Opiate use 03/26/2016  . Encounter for therapeutic drug level monitoring 03/26/2016  . Encounter for pain management planning 03/26/2016  . Chronic sacroiliac joint pain (B) (R>L) 03/26/2016  . Chronic hip pain, left 03/26/2016  . Chronic right hip pain 03/26/2016  . Chronic hip pain (B) (L>R) 03/26/2016  . Chronic left sacroiliac pain 03/26/2016  . Chronic right sacroiliac pain 03/26/2016   Resolved Ambulatory Problems    Diagnosis Date Noted  . No Resolved Ambulatory Problems   Past Medical History:  Diagnosis Date  . Anxiety   . CAD (coronary artery disease)   . Chronic pain   . COPD (chronic obstructive pulmonary disease) ([place])   . Dyslipidemia   . History of tobacco abuse   . Hypertension   . Opioid dependence ([place])   . Respiratory failure ([place])    Constitutional Exam  General appearance: Well nourished, well developed, and well hydrated. In no apparent acute distress Vitals:   03/26/16 1113 03/26/16 1115  BP:  (!) 124/97  Pulse: 78   Resp: 16   Temp: 97.8 F (36.6 C)   SpO2: 97%   Weight: 183 lb (83 kg)   Height: '5\' 9"'  (1.753 m)    BMI Assessment: Estimated body mass  index is 27.02 kg/m as calculated from the following:   Height as of this encounter: '5\' 9"'  (1.753 m).   Weight as of this encounter: 183 lb (83 kg).  BMI interpretation table: BMI level Category Range association with higher incidence of chronic pain  <18 kg/m2 Underweight   18.5-24.9 kg/m2 Ideal body weight   25-29.9 kg/m2 Overweight Increased incidence by 20%  30-34.9 kg/m2 Obese (Class I) Increased incidence by 68%  35-39.9 kg/m2 Severe obesity (Class II) Increased incidence by 136%  >40 kg/m2 Extreme obesity (Class  III) Increased incidence by 254%   BMI Readings from Last 4 Encounters:  03/26/16 27.02 kg/m  01/09/16 28.71 kg/m  08/20/15 27.76 kg/m  07/15/15 28.35 kg/m   Wt Readings from Last 4 Encounters:  03/26/16 183 lb (83 kg)  01/09/16 194 lb 7.1 oz (88.2 kg)  08/20/15 188 lb (85.3 kg)  07/15/15 192 lb (87.1 kg)  Psych/Mental status: Alert, oriented x 3 (person, place, & time) Eyes: PERLA Respiratory: No evidence of acute respiratory distress  Cervical Spine Exam  Inspection: No masses, redness, or swelling Alignment: Symmetrical Functional ROM: Unrestricted ROM Stability: No instability detected Muscle strength & Tone: Functionally intact Sensory: Unimpaired Palpation: Non-contributory  Upper Extremity (UE) Exam    Side: Right upper extremity  Side: Left upper extremity  Inspection: No masses, redness, swelling, or asymmetry  Inspection: No masses, redness, swelling, or asymmetry  Functional ROM: Unrestricted ROM          Functional ROM: Unrestricted ROM          Muscle strength & Tone: Functionally intact  Muscle strength & Tone: Functionally intact  Sensory: Unimpaired  Sensory: Unimpaired  Palpation: Non-contributory  Palpation: Non-contributory   Thoracic Spine Exam  Inspection: No masses, redness, or swelling Alignment: Symmetrical Functional ROM: Unrestricted ROM Stability: No instability detected Sensory: Unimpaired Muscle strength & Tone: Functionally intact Palpation: Non-contributory  Lumbar Spine Exam  Inspection: No masses, redness, or swelling Alignment: Symmetrical Functional ROM: Decreased ROM Stability: No instability detected Muscle strength & Tone: Functionally intact Sensory: Movement-associated pain Palpation: Complains of area being tender to palpation Provocative Tests: Lumbar Hyperextension and rotation test: Positive bilaterally for facet joint pain. Patrick's Maneuver: Positive for bilateral S-I joint pain and for bilateral hip joint  pain.  Gait & Posture Assessment  Ambulation: Unassisted Gait: Relatively normal for age and body habitus Posture: WNL   Lower Extremity Exam    Side: Right lower extremity  Side: Left lower extremity  Inspection: No masses, redness, swelling, or asymmetry  Inspection: No masses, redness, swelling, or asymmetry  Functional ROM: Unrestricted ROM          Functional ROM: Unrestricted ROM          Muscle strength & Tone: Functionally intact  Muscle strength & Tone: Functionally intact  Sensory: Unimpaired  Sensory: Unimpaired  Palpation: Non-contributory  Palpation: Non-contributory   Assessment  Primary Diagnosis & Pertinent Problem List: The primary encounter diagnosis was Chronic pain syndrome. Diagnoses of Chronic bilateral low back pain without sciatica, Long term current use of opiate analgesic, Long term prescription opiate use, Opiate use, Encounter for therapeutic drug level monitoring, Encounter for pain management planning, Chronic sacroiliac joint pain (B) (R>L), Chronic hip pain, left, Chronic right hip pain, Chronic hip pain, unspecified laterality, Chronic left sacroiliac pain, and Chronic right sacroiliac pain were also pertinent to this visit.  Visit Diagnosis: 1. Chronic pain syndrome   2. Chronic bilateral low back pain without sciatica   3.  Long term current use of opiate analgesic   4. Long term prescription opiate use   5. Opiate use   6. Encounter for therapeutic drug level monitoring   7. Encounter for pain management planning   8. 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 Chronic hip pain, left   10. Chronic right hip pain   11. Chronic hip pain, unspecified laterality   12. Chronic left sacroiliac pain   13. Chronic right sacroiliac pain    Plan of Care  Initial treatment plan:  Please be advised that as per protocol, today's visit has been an evaluation only. We have not taken over the patient's controlled substance management.  Problem-specific  plan: No problem-specific Assessment & Plan notes found for this encounter.  Ordered Lab-work, Procedure(s), Referral(s), & Consult(s): Orders Placed This Encounter  Procedures  . DG Lumbar Spine Complete W/Bend  . DG Si Joints  . DG HIP UNILAT W OR W/O PELVIS 2-3 VIEWS LEFT  . DG HIP UNILAT W OR W/O PELVIS 2-3 VIEWS RIGHT  . Compliance Drug Analysis, Ur  . Comprehensive metabolic panel  . C-reactive protein  . Magnesium  . Sedimentation rate  . Vitamin B12  . 25-Hydroxyvitamin D Lcms D2+D3  . Brain natriuretic peptide  . Ambulatory referral to Psychology   Pharmacotherapy: Medications ordered:  No orders of the defined types were placed in this encounter.  Medications administered during this visit: Mr. Hissong had no medications administered during this visit.   Pharmacotherapy under consideration:  Opioid Analgesics: The patient was informed that there is no guarantee that he would be a candidate for opioid analgesics. The decision will be made following CDC guidelines. This decision will be based on the results of diagnostic studies, as well as Mr. Kepner risk profile.  Membrane stabilizer: To be determined at a later time Muscle relaxant: To be determined at a later time NSAID: To be determined at a later time Other analgesic(s): To be determined at a later time   Interventional therapies under consideration: Mr. Haji was informed that there is no guarantee that he would be a candidate for interventional therapies. The decision will be based on the results of diagnostic studies, as well as Mr. Danford risk profile.  Possible procedure(s): Diagnostic bilateral lumbar facet block under fluoroscopic guidance and IV sedation  Possible bilateral lumbar facet radiofrequency ablation  Diagnostic bilateral sacroiliac joint block  Possible bilateral sacroiliac joint radiofrequency ablation  Diagnostic bilateral intra-articular hip joint injection  Possible bilateral hip  joint radiofrequency ablation    Provider-requested follow-up: Return for 2nd Visit, after Med[facility] evaluation.  No future appointments.  Primary Care Physician: Redmond School, MD Location[facility] Out[facility] Note by: Kathlen Brunswick. Dossie Arbour, M.D, DABA, DABAPM, DABPM, DABIPP, FIPP Date: 03/26/16; Time: 12:07 PM  Pain Score Disclaimer: We use the NRS-11 scale. This is a self-reported, subjective measurement of pain severity with only modest accuracy. It is used primarily to identify changes within a particular patient. It must be understood that outpatient pain scales are significantly less accurate that those used for research, where they can be applied under ideal controlled circumstances with minimal exposure to variables. In reality, the score is likely to be a combination of pain intensity and pain affect, where pain affect describes the degree of emotional arousal or changes in action readiness caused by the sensory experience of pain. Factors such as social and work situation, setting, emotional state, anxiety levels, expectation, and prior pain experience may influence pain  perception and show large inter-individual differences that may also be affected by time variables.  Patient instructions provided during this appointment: There are no Patient Instructions on file for this visit.

## 2016-03-28 ENCOUNTER — Encounter: Payer: Self-pay | Admitting: Pain Medicine

## 2016-03-28 DIAGNOSIS — R7982 Elevated C-reactive protein (CRP): Secondary | ICD-10-CM | POA: Insufficient documentation

## 2016-03-28 NOTE — Progress Notes (Signed)
Results were reviewed and found to be: abnormal  Surgical consultation may be of benefit  Review would suggest interventional pain management techniques may be of benefit

## 2016-03-28 NOTE — Progress Notes (Signed)
Normal CRP (C-reactive protein) Level(s): Less than <1.0 mg/dL. Elevated level(s): Levels above 1.0 mg/dL. Clinical significance: C-reactive protein (CRP) is produced by the liver. The level of CRP rises when there is inflammation throughout the body. CRP goes up in response to inflammation. High levels suggests the presence of chronic inflammation but do not identify its location or cause. Drops of previously elevated levels suggest that the inflammation or infection is subsiding and/or responding to treatment. Possible causes: High levels have been observed in obese patients, individuals with bacterial infections, chronic inflammation, or flare-ups of inflammatory conditions. Recommendations: - Consider the use of anti-inflammatory diet and medications. 

## 2016-03-29 LAB — 25-HYDROXYVITAMIN D LCMS D2+D3: 25-HYDROXY, VITAMIN D-3: 21 ng/mL

## 2016-03-29 LAB — 25-HYDROXY VITAMIN D LCMS D2+D3: 25-Hydroxy, Vitamin D: 21 ng/mL — ABNORMAL LOW

## 2016-04-01 ENCOUNTER — Encounter: Payer: Self-pay | Admitting: Pain Medicine

## 2016-04-01 ENCOUNTER — Other Ambulatory Visit: Payer: Self-pay | Admitting: Pain Medicine

## 2016-04-01 DIAGNOSIS — E559 Vitamin D deficiency, unspecified: Secondary | ICD-10-CM

## 2016-04-01 DIAGNOSIS — J441 Chronic obstructive pulmonary disease with (acute) exacerbation: Secondary | ICD-10-CM | POA: Diagnosis not present

## 2016-04-01 MED ORDER — VITAMIN D3 50 MCG (2000 UT) PO CAPS: ORAL_CAPSULE | ORAL | 99 refills | Status: DC

## 2016-04-01 MED ORDER — VITAMIN D (ERGOCALCIFEROL) 1.25 MG (50000 UNIT) PO CAPS: ORAL_CAPSULE | ORAL | 0 refills | Status: DC

## 2016-04-01 NOTE — Progress Notes (Signed)

## 2016-04-02 LAB — COMPLIANCE DRUG ANALYSIS, UR

## 2016-04-04 DIAGNOSIS — Z6825 Body mass index (BMI) 25.0-25.9, adult: Secondary | ICD-10-CM | POA: Diagnosis not present

## 2016-04-04 DIAGNOSIS — Z1389 Encounter for screening for other disorder: Secondary | ICD-10-CM | POA: Diagnosis not present

## 2016-04-04 DIAGNOSIS — E748 Other specified disorders of carbohydrate metabolism: Secondary | ICD-10-CM | POA: Diagnosis not present

## 2016-04-04 DIAGNOSIS — E663 Overweight: Secondary | ICD-10-CM | POA: Diagnosis not present

## 2016-04-04 DIAGNOSIS — J449 Chronic obstructive pulmonary disease, unspecified: Secondary | ICD-10-CM | POA: Diagnosis not present

## 2016-04-04 DIAGNOSIS — G894 Chronic pain syndrome: Secondary | ICD-10-CM | POA: Diagnosis not present

## 2016-04-04 DIAGNOSIS — F1120 Opioid dependence, uncomplicated: Secondary | ICD-10-CM | POA: Diagnosis not present

## 2016-04-04 DIAGNOSIS — M2550 Pain in unspecified joint: Secondary | ICD-10-CM | POA: Diagnosis not present

## 2016-04-07 DIAGNOSIS — J441 Chronic obstructive pulmonary disease with (acute) exacerbation: Secondary | ICD-10-CM | POA: Diagnosis not present

## 2016-04-16 ENCOUNTER — Telehealth: Payer: Self-pay | Admitting: *Deleted

## 2016-04-16 ENCOUNTER — Telehealth: Payer: Self-pay | Admitting: Pain Medicine

## 2016-04-16 NOTE — Telephone Encounter (Signed)
Patient called asking questions about 2 Vitamin D dosages he is taking and would like a call back

## 2016-04-16 NOTE — Telephone Encounter (Signed)
Voicemail left re; medication question and why he was taking 2 vitamin D Rx's and the difference between the Rx. Instructed patient that if he has additional questions to please call back and we could further discuss this medication.

## 2016-04-16 NOTE — Telephone Encounter (Signed)
Attempted to reach patient, no voicemail capability.  °

## 2016-04-23 DIAGNOSIS — F4542 Pain disorder with related psychological factors: Secondary | ICD-10-CM | POA: Diagnosis not present

## 2016-05-02 DIAGNOSIS — J441 Chronic obstructive pulmonary disease with (acute) exacerbation: Secondary | ICD-10-CM | POA: Diagnosis not present

## 2016-05-08 DIAGNOSIS — J441 Chronic obstructive pulmonary disease with (acute) exacerbation: Secondary | ICD-10-CM | POA: Diagnosis not present

## 2016-06-02 DIAGNOSIS — J441 Chronic obstructive pulmonary disease with (acute) exacerbation: Secondary | ICD-10-CM | POA: Diagnosis not present

## 2016-06-08 DIAGNOSIS — J441 Chronic obstructive pulmonary disease with (acute) exacerbation: Secondary | ICD-10-CM | POA: Diagnosis not present

## 2016-06-24 ENCOUNTER — Ambulatory Visit: Payer: Commercial Managed Care - HMO | Admitting: Pain Medicine

## 2016-06-30 DIAGNOSIS — J441 Chronic obstructive pulmonary disease with (acute) exacerbation: Secondary | ICD-10-CM | POA: Diagnosis not present

## 2016-07-03 DIAGNOSIS — Z6827 Body mass index (BMI) 27.0-27.9, adult: Secondary | ICD-10-CM | POA: Diagnosis not present

## 2016-07-03 DIAGNOSIS — G894 Chronic pain syndrome: Secondary | ICD-10-CM | POA: Diagnosis not present

## 2016-07-03 DIAGNOSIS — I2510 Atherosclerotic heart disease of native coronary artery without angina pectoris: Secondary | ICD-10-CM | POA: Diagnosis not present

## 2016-07-03 DIAGNOSIS — J449 Chronic obstructive pulmonary disease, unspecified: Secondary | ICD-10-CM | POA: Diagnosis not present

## 2016-07-03 DIAGNOSIS — Z23 Encounter for immunization: Secondary | ICD-10-CM | POA: Diagnosis not present

## 2016-07-14 DIAGNOSIS — E663 Overweight: Secondary | ICD-10-CM | POA: Diagnosis not present

## 2016-07-14 DIAGNOSIS — Z6827 Body mass index (BMI) 27.0-27.9, adult: Secondary | ICD-10-CM | POA: Diagnosis not present

## 2016-07-14 DIAGNOSIS — M1991 Primary osteoarthritis, unspecified site: Secondary | ICD-10-CM | POA: Diagnosis not present

## 2016-07-14 DIAGNOSIS — Z0001 Encounter for general adult medical examination with abnormal findings: Secondary | ICD-10-CM | POA: Diagnosis not present

## 2016-07-14 DIAGNOSIS — K219 Gastro-esophageal reflux disease without esophagitis: Secondary | ICD-10-CM | POA: Diagnosis not present

## 2016-07-14 DIAGNOSIS — E782 Mixed hyperlipidemia: Secondary | ICD-10-CM | POA: Diagnosis not present

## 2016-07-14 DIAGNOSIS — J449 Chronic obstructive pulmonary disease, unspecified: Secondary | ICD-10-CM | POA: Diagnosis not present

## 2016-07-14 DIAGNOSIS — Z1389 Encounter for screening for other disorder: Secondary | ICD-10-CM | POA: Diagnosis not present

## 2016-07-14 DIAGNOSIS — R7309 Other abnormal glucose: Secondary | ICD-10-CM | POA: Diagnosis not present

## 2016-07-14 DIAGNOSIS — G894 Chronic pain syndrome: Secondary | ICD-10-CM | POA: Diagnosis not present

## 2016-07-14 DIAGNOSIS — I2510 Atherosclerotic heart disease of native coronary artery without angina pectoris: Secondary | ICD-10-CM | POA: Diagnosis not present

## 2016-07-14 DIAGNOSIS — Z125 Encounter for screening for malignant neoplasm of prostate: Secondary | ICD-10-CM | POA: Diagnosis not present

## 2016-07-15 DIAGNOSIS — D649 Anemia, unspecified: Secondary | ICD-10-CM | POA: Diagnosis not present

## 2016-07-16 DIAGNOSIS — M1991 Primary osteoarthritis, unspecified site: Secondary | ICD-10-CM | POA: Diagnosis not present

## 2016-07-16 DIAGNOSIS — J449 Chronic obstructive pulmonary disease, unspecified: Secondary | ICD-10-CM | POA: Diagnosis not present

## 2016-07-16 DIAGNOSIS — K219 Gastro-esophageal reflux disease without esophagitis: Secondary | ICD-10-CM | POA: Diagnosis not present

## 2016-07-16 DIAGNOSIS — G894 Chronic pain syndrome: Secondary | ICD-10-CM | POA: Diagnosis not present

## 2016-07-16 DIAGNOSIS — Z0001 Encounter for general adult medical examination with abnormal findings: Secondary | ICD-10-CM | POA: Diagnosis not present

## 2016-07-22 ENCOUNTER — Ambulatory Visit: Payer: Commercial Managed Care - HMO | Admitting: Pain Medicine

## 2016-07-31 DIAGNOSIS — J441 Chronic obstructive pulmonary disease with (acute) exacerbation: Secondary | ICD-10-CM | POA: Diagnosis not present

## 2016-08-06 DIAGNOSIS — J441 Chronic obstructive pulmonary disease with (acute) exacerbation: Secondary | ICD-10-CM | POA: Diagnosis not present

## 2016-08-19 ENCOUNTER — Ambulatory Visit: Payer: Commercial Managed Care - HMO | Attending: Pain Medicine | Admitting: Pain Medicine

## 2016-08-30 DIAGNOSIS — J441 Chronic obstructive pulmonary disease with (acute) exacerbation: Secondary | ICD-10-CM | POA: Diagnosis not present

## 2016-09-05 DIAGNOSIS — J441 Chronic obstructive pulmonary disease with (acute) exacerbation: Secondary | ICD-10-CM | POA: Diagnosis not present

## 2016-09-26 DIAGNOSIS — Z6825 Body mass index (BMI) 25.0-25.9, adult: Secondary | ICD-10-CM | POA: Diagnosis not present

## 2016-09-26 DIAGNOSIS — K219 Gastro-esophageal reflux disease without esophagitis: Secondary | ICD-10-CM | POA: Diagnosis not present

## 2016-09-26 DIAGNOSIS — J449 Chronic obstructive pulmonary disease, unspecified: Secondary | ICD-10-CM | POA: Diagnosis not present

## 2016-09-26 DIAGNOSIS — G894 Chronic pain syndrome: Secondary | ICD-10-CM | POA: Diagnosis not present

## 2016-09-30 DIAGNOSIS — J441 Chronic obstructive pulmonary disease with (acute) exacerbation: Secondary | ICD-10-CM | POA: Diagnosis not present

## 2016-10-06 DIAGNOSIS — J441 Chronic obstructive pulmonary disease with (acute) exacerbation: Secondary | ICD-10-CM | POA: Diagnosis not present

## 2016-10-07 DIAGNOSIS — M1991 Primary osteoarthritis, unspecified site: Secondary | ICD-10-CM | POA: Diagnosis not present

## 2016-10-07 DIAGNOSIS — K219 Gastro-esophageal reflux disease without esophagitis: Secondary | ICD-10-CM | POA: Diagnosis not present

## 2016-10-07 DIAGNOSIS — J42 Unspecified chronic bronchitis: Secondary | ICD-10-CM | POA: Diagnosis not present

## 2016-10-07 DIAGNOSIS — Z6825 Body mass index (BMI) 25.0-25.9, adult: Secondary | ICD-10-CM | POA: Diagnosis not present

## 2016-10-07 DIAGNOSIS — J329 Chronic sinusitis, unspecified: Secondary | ICD-10-CM | POA: Diagnosis not present

## 2016-10-07 DIAGNOSIS — E663 Overweight: Secondary | ICD-10-CM | POA: Diagnosis not present

## 2016-10-07 DIAGNOSIS — J449 Chronic obstructive pulmonary disease, unspecified: Secondary | ICD-10-CM | POA: Diagnosis not present

## 2016-10-30 DIAGNOSIS — J329 Chronic sinusitis, unspecified: Secondary | ICD-10-CM | POA: Diagnosis not present

## 2016-10-30 DIAGNOSIS — Z1389 Encounter for screening for other disorder: Secondary | ICD-10-CM | POA: Diagnosis not present

## 2016-10-30 DIAGNOSIS — J42 Unspecified chronic bronchitis: Secondary | ICD-10-CM | POA: Diagnosis not present

## 2016-10-30 DIAGNOSIS — J449 Chronic obstructive pulmonary disease, unspecified: Secondary | ICD-10-CM | POA: Diagnosis not present

## 2016-10-30 DIAGNOSIS — E663 Overweight: Secondary | ICD-10-CM | POA: Diagnosis not present

## 2016-10-30 DIAGNOSIS — E748 Other specified disorders of carbohydrate metabolism: Secondary | ICD-10-CM | POA: Diagnosis not present

## 2016-10-30 DIAGNOSIS — Z6825 Body mass index (BMI) 25.0-25.9, adult: Secondary | ICD-10-CM | POA: Diagnosis not present

## 2016-11-05 DIAGNOSIS — J441 Chronic obstructive pulmonary disease with (acute) exacerbation: Secondary | ICD-10-CM | POA: Diagnosis not present

## 2016-11-11 ENCOUNTER — Emergency Department (HOSPITAL_COMMUNITY): Payer: Medicare HMO

## 2016-11-11 ENCOUNTER — Inpatient Hospital Stay (HOSPITAL_COMMUNITY)
Admission: EM | Admit: 2016-11-11 | Discharge: 2016-11-14 | DRG: 193 | Disposition: A | Discharge status: Home / Self Care | Payer: Medicare HMO | Attending: Internal Medicine | Admitting: Internal Medicine

## 2016-11-11 ENCOUNTER — Encounter (HOSPITAL_COMMUNITY): Payer: Self-pay | Admitting: Emergency Medicine

## 2016-11-11 DIAGNOSIS — J189 Pneumonia, unspecified organism: Secondary | ICD-10-CM | POA: Diagnosis not present

## 2016-11-11 DIAGNOSIS — Z9981 Dependence on supplemental oxygen: Secondary | ICD-10-CM

## 2016-11-11 DIAGNOSIS — Z79899 Other long term (current) drug therapy: Secondary | ICD-10-CM | POA: Diagnosis not present

## 2016-11-11 DIAGNOSIS — M545 Low back pain, unspecified: Secondary | ICD-10-CM | POA: Diagnosis present

## 2016-11-11 DIAGNOSIS — R042 Hemoptysis: Secondary | ICD-10-CM | POA: Diagnosis not present

## 2016-11-11 DIAGNOSIS — J440 Chronic obstructive pulmonary disease with acute lower respiratory infection: Secondary | ICD-10-CM | POA: Diagnosis present

## 2016-11-11 DIAGNOSIS — R0600 Dyspnea, unspecified: Secondary | ICD-10-CM | POA: Diagnosis not present

## 2016-11-11 DIAGNOSIS — I2510 Atherosclerotic heart disease of native coronary artery without angina pectoris: Secondary | ICD-10-CM | POA: Diagnosis present

## 2016-11-11 DIAGNOSIS — I10 Essential (primary) hypertension: Secondary | ICD-10-CM | POA: Diagnosis not present

## 2016-11-11 DIAGNOSIS — G894 Chronic pain syndrome: Secondary | ICD-10-CM | POA: Diagnosis not present

## 2016-11-11 DIAGNOSIS — Z79891 Long term (current) use of opiate analgesic: Secondary | ICD-10-CM | POA: Diagnosis not present

## 2016-11-11 DIAGNOSIS — J9621 Acute and chronic respiratory failure with hypoxia: Secondary | ICD-10-CM | POA: Diagnosis present

## 2016-11-11 DIAGNOSIS — E785 Hyperlipidemia, unspecified: Secondary | ICD-10-CM | POA: Diagnosis present

## 2016-11-11 DIAGNOSIS — Z9889 Other specified postprocedural states: Secondary | ICD-10-CM | POA: Diagnosis not present

## 2016-11-11 DIAGNOSIS — D72829 Elevated white blood cell count, unspecified: Secondary | ICD-10-CM

## 2016-11-11 DIAGNOSIS — J441 Chronic obstructive pulmonary disease with (acute) exacerbation: Secondary | ICD-10-CM | POA: Diagnosis present

## 2016-11-11 DIAGNOSIS — F419 Anxiety disorder, unspecified: Secondary | ICD-10-CM | POA: Diagnosis present

## 2016-11-11 DIAGNOSIS — F1190 Opioid use, unspecified, uncomplicated: Secondary | ICD-10-CM | POA: Diagnosis present

## 2016-11-11 DIAGNOSIS — Z7982 Long term (current) use of aspirin: Secondary | ICD-10-CM | POA: Diagnosis not present

## 2016-11-11 DIAGNOSIS — J398 Other specified diseases of upper respiratory tract: Secondary | ICD-10-CM | POA: Diagnosis present

## 2016-11-11 DIAGNOSIS — Z806 Family history of leukemia: Secondary | ICD-10-CM | POA: Diagnosis not present

## 2016-11-11 DIAGNOSIS — R05 Cough: Secondary | ICD-10-CM | POA: Diagnosis not present

## 2016-11-11 DIAGNOSIS — J9601 Acute respiratory failure with hypoxia: Secondary | ICD-10-CM | POA: Diagnosis not present

## 2016-11-11 DIAGNOSIS — Z888 Allergy status to other drugs, medicaments and biological substances status: Secondary | ICD-10-CM

## 2016-11-11 DIAGNOSIS — Z87891 Personal history of nicotine dependence: Secondary | ICD-10-CM

## 2016-11-11 DIAGNOSIS — G8929 Other chronic pain: Secondary | ICD-10-CM | POA: Diagnosis present

## 2016-11-11 HISTORY — DX: Dependence on supplemental oxygen: Z99.81

## 2016-11-11 LAB — CBC WITH DIFFERENTIAL/PLATELET
BASOS PCT: 0 %
Basophils Absolute: 0 10*3/uL (ref 0.0–0.1)
EOS ABS: 0 10*3/uL (ref 0.0–0.7)
Eosinophils Relative: 0 %
HEMATOCRIT: 42.6 % (ref 39.0–52.0)
HEMOGLOBIN: 14.4 g/dL (ref 13.0–17.0)
LYMPHS ABS: 1.8 10*3/uL (ref 0.7–4.0)
Lymphocytes Relative: 8 %
MCH: 31.3 pg (ref 26.0–34.0)
MCHC: 33.8 g/dL (ref 30.0–36.0)
MCV: 92.6 fL (ref 78.0–100.0)
MONO ABS: 1.1 10*3/uL — AB (ref 0.1–1.0)
MONOS PCT: 5 %
NEUTROS ABS: 20.5 10*3/uL — AB (ref 1.7–7.7)
Neutrophils Relative %: 87 %
Platelets: 209 10*3/uL (ref 150–400)
RBC: 4.6 MIL/uL (ref 4.22–5.81)
RDW: 13.7 % (ref 11.5–15.5)
WBC: 23.4 10*3/uL — ABNORMAL HIGH (ref 4.0–10.5)

## 2016-11-11 LAB — LACTIC ACID, PLASMA
LACTIC ACID, VENOUS: 1.6 mmol/L (ref 0.5–1.9)
Lactic Acid, Venous: 2.6 mmol/L (ref 0.5–1.9)

## 2016-11-11 LAB — BASIC METABOLIC PANEL
Anion gap: 8 (ref 5–15)
BUN: 19 mg/dL (ref 6–20)
CALCIUM: 8.6 mg/dL — AB (ref 8.9–10.3)
CO2: 28 mmol/L (ref 22–32)
CREATININE: 0.99 mg/dL (ref 0.61–1.24)
Chloride: 100 mmol/L — ABNORMAL LOW (ref 101–111)
GFR calc non Af Amer: >60 mL/min (ref >60)
Glucose, Bld: 100 mg/dL — ABNORMAL HIGH (ref 65–99)
Potassium: 4 mmol/L (ref 3.5–5.1)
SODIUM: 136 mmol/L (ref 135–145)

## 2016-11-11 LAB — TROPONIN I

## 2016-11-11 MED ORDER — ALBUTEROL SULFATE (2.5 MG/3ML) 0.083% IN NEBU: 2.5000 mg | INHALATION_SOLUTION | RESPIRATORY_TRACT | Status: DC | PRN

## 2016-11-11 MED ORDER — IPRATROPIUM-ALBUTEROL 0.5-2.5 (3) MG/3ML IN SOLN
3.0000 mL | Freq: Once | RESPIRATORY_TRACT | Status: AC
  Administered 2016-11-11: 3 mL via RESPIRATORY_TRACT
  Filled 2016-11-11: qty 3

## 2016-11-11 MED ORDER — ONDANSETRON HCL 4 MG/2ML IJ SOLN: 4.0000 mg | Freq: Four times a day (QID) | INTRAMUSCULAR | Status: DC | PRN

## 2016-11-11 MED ORDER — IOPAMIDOL (ISOVUE-370) INJECTION 76%
100.0000 mL | Freq: Once | INTRAVENOUS | Status: AC | PRN
  Administered 2016-11-11: 100 mL via INTRAVENOUS

## 2016-11-11 MED ORDER — SODIUM CHLORIDE 0.9 % IV BOLUS (SEPSIS)
500.0000 mL | Freq: Once | INTRAVENOUS | Status: AC
Start: 2016-11-11 — End: 2016-11-11
  Administered 2016-11-11: 500 mL via INTRAVENOUS

## 2016-11-11 MED ORDER — OXYCODONE-ACETAMINOPHEN 7.5-325 MG PO TABS
1.0000 | ORAL_TABLET | ORAL | Status: DC | PRN
  Administered 2016-11-12 – 2016-11-13 (×3): 1 via ORAL
  Filled 2016-11-11 (×3): qty 1

## 2016-11-11 MED ORDER — ASPIRIN EC 81 MG PO TBEC
162.0000 mg | DELAYED_RELEASE_TABLET | Freq: Every day | ORAL | Status: DC
  Administered 2016-11-12 – 2016-11-14 (×3): 162 mg via ORAL
  Filled 2016-11-11 (×3): qty 2

## 2016-11-11 MED ORDER — ACETAMINOPHEN 650 MG RE SUPP: 650.0000 mg | Freq: Four times a day (QID) | RECTAL | Status: DC | PRN

## 2016-11-11 MED ORDER — MOMETASONE FURO-FORMOTEROL FUM 200-5 MCG/ACT IN AERO
2.0000 | INHALATION_SPRAY | Freq: Two times a day (BID) | RESPIRATORY_TRACT | Status: DC
  Administered 2016-11-12 – 2016-11-14 (×4): 2 via RESPIRATORY_TRACT
  Filled 2016-11-11: qty 8.8

## 2016-11-11 MED ORDER — DEXTROSE 5 % IV SOLN
1.0000 g | Freq: Once | INTRAVENOUS | Status: AC
  Administered 2016-11-11: 1 g via INTRAVENOUS
  Filled 2016-11-11: qty 10

## 2016-11-11 MED ORDER — PANTOPRAZOLE SODIUM 40 MG PO TBEC
40.0000 mg | DELAYED_RELEASE_TABLET | Freq: Every day | ORAL | Status: DC
  Administered 2016-11-12 – 2016-11-14 (×3): 40 mg via ORAL
  Filled 2016-11-11 (×3): qty 1

## 2016-11-11 MED ORDER — DULOXETINE HCL 20 MG PO CPEP
20.0000 mg | ORAL_CAPSULE | Freq: Every day | ORAL | Status: DC
  Administered 2016-11-12 – 2016-11-13 (×3): 20 mg via ORAL
  Filled 2016-11-11 (×3): qty 1

## 2016-11-11 MED ORDER — ALBUTEROL SULFATE (2.5 MG/3ML) 0.083% IN NEBU
2.5000 mg | INHALATION_SOLUTION | Freq: Four times a day (QID) | RESPIRATORY_TRACT | Status: DC
  Administered 2016-11-12 – 2016-11-14 (×9): 2.5 mg via RESPIRATORY_TRACT
  Filled 2016-11-11 (×9): qty 3

## 2016-11-11 MED ORDER — SIMVASTATIN 20 MG PO TABS
20.0000 mg | ORAL_TABLET | Freq: Every day | ORAL | Status: DC
  Administered 2016-11-12 – 2016-11-13 (×2): 20 mg via ORAL
  Filled 2016-11-11 (×2): qty 1

## 2016-11-11 MED ORDER — FLUDROCORTISONE ACETATE 0.1 MG PO TABS
0.1000 mg | ORAL_TABLET | Freq: Every day | ORAL | Status: DC
  Administered 2016-11-12 – 2016-11-14 (×3): 0.1 mg via ORAL
  Filled 2016-11-11 (×6): qty 1

## 2016-11-11 MED ORDER — DEXTROSE 5 % IV SOLN
500.0000 mg | Freq: Once | INTRAVENOUS | Status: AC
  Administered 2016-11-11: 500 mg via INTRAVENOUS
  Filled 2016-11-11: qty 500

## 2016-11-11 MED ORDER — ALBUTEROL SULFATE (2.5 MG/3ML) 0.083% IN NEBU
2.5000 mg | INHALATION_SOLUTION | Freq: Once | RESPIRATORY_TRACT | Status: AC
  Administered 2016-11-11: 2.5 mg via RESPIRATORY_TRACT
  Filled 2016-11-11: qty 3

## 2016-11-11 MED ORDER — SODIUM CHLORIDE 0.9% FLUSH
3.0000 mL | Freq: Two times a day (BID) | INTRAVENOUS | Status: DC
  Administered 2016-11-12 – 2016-11-13 (×5): 3 mL via INTRAVENOUS

## 2016-11-11 MED ORDER — ONDANSETRON HCL 4 MG PO TABS: 4.0000 mg | ORAL_TABLET | Freq: Four times a day (QID) | ORAL | Status: DC | PRN

## 2016-11-11 MED ORDER — POLYVINYL ALCOHOL 1.4 % OP SOLN: 1.0000 [drp] | Freq: Two times a day (BID) | OPHTHALMIC | Status: DC | PRN

## 2016-11-11 MED ORDER — HEPARIN SODIUM (PORCINE) 5000 UNIT/ML IJ SOLN
5000.0000 [IU] | Freq: Three times a day (TID) | INTRAMUSCULAR | Status: DC
  Administered 2016-11-12 – 2016-11-14 (×7): 5000 [IU] via SUBCUTANEOUS
  Filled 2016-11-11 (×7): qty 1

## 2016-11-11 MED ORDER — ACETAMINOPHEN 325 MG PO TABS: 650.0000 mg | ORAL_TABLET | Freq: Four times a day (QID) | ORAL | Status: DC | PRN

## 2016-11-11 NOTE — H&P (Signed)
History and Physical    [name]:[id number] DOB: 27-Mar-1951 DOA: 11/11/2016  PCP: Elfredia Nevins, MD  Patient coming from: Home.    Chief Complaint:  DOE, coughs for the past week.   HPI: [name] is an 66 y.o. male with hx of COPD on home oxygen,  HTN, HLD, chronic pain syndrome, opoid dependence, presented to the ER with increased SOB, DOE, and hypoxia.  Evaluation in the ER showed marked leukocytosis of 23K (suspect from a ?steroid injection given in Dr Sharyon Medicus office), negative lactic acid.  His CT of the chest showed no PE, but extensive PNA on the right side, along with either mass or dense consolidation.  He has negative UA, negative troponin, and normal renal fx tests.  He was started on IV rocephin and Zithromax, and hospitalist was asked to admit him for CAP, COPD exacerbation with hypoxic respiratory failure.   ED Course:  See above.  Rewiew of Systems:  Constitutional: Negative for malaise, fever and chills. No significant weight loss or weight gain Eyes: Negative for eye pain, redness and discharge, diplopia, visual changes, or flashes of light. ENMT: Negative for ear pain, hoarseness, nasal congestion, sinus pressure and sore throat. No headaches; tinnitus, drooling, or problem swallowing. Cardiovascular: Negative for chest pain, palpitations, diaphoresis,and peripheral edema. ; No orthopnea, PND Respiratory: Negative for hemoptysis, wheezing and stridor. No pleuritic chestpain. Gastrointestinal: Negative for diarrhea, constipation,  melena, blood in stool, hematemesis, jaundice and rectal bleeding.    Genitourinary: Negative for frequency, dysuria, incontinence,flank pain and hematuria; Musculoskeletal: Negative for back pain and neck pain. Negative for swelling and trauma.;  Skin: . Negative for pruritus, rash, abrasions, bruising and skin lesion.; ulcerations Neuro: Negative for headache, lightheadedness and neck stiffness. Negative for weakness, altered level  of consciousness , altered mental status, extremity weakness, burning feet, involuntary movement, seizure and syncope.  Psych: negative for anxiety, depression, insomnia, tearfulness, panic attacks, hallucinations, paranoia, suicidal or homicidal ideation    Past Medical History:  Diagnosis Date  . Anxiety   . CAD (coronary artery disease)   . Chronic pain   . 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 Dyslipidemia   . History of tobacco abuse    80 pack year history   . Hypertension   . On home O2    4L N/C  . Opioid dependence (HCC)   . Respiratory failure [facility])     Past Surgical History:  Procedure Laterality Date  . CARDIAC CATHETERIZATION  08/10/2008   right & left - normal coronaries (Dr. Claudia Desanctis)  . NM MYOCAR PERF WALL MOTION  07/2008   bruce myoview - mild ischemia in basal inferoseptal & mid inferoseptal regions; EF 63%  . TRANSTHORACIC ECHOCARDIOGRAM  07/2008   EF=>55%; RV mildly dilated; RA mild-mod dilated; mild mitral annular calcification & mild MR; AV mildly sclerotic     reports that he quit smoking about 9 years ago. He started smoking about 42 years ago. He has never used smokeless tobacco. He reports that he does not drink alcohol or use drugs.  Allergies  Allergen Reactions  . Ativan [Lorazepam] Other (See Comments)    Dry mouth sinus and breathing problems     Family History  Problem Relation Age of Onset  . Leukemia Mother   . Suicidality Father      Prior to Admission medications   Medication Sig Start Date End Date Taking? Authorizing Provider  albuterol (PROVENTIL HFA;VENTOLIN HFA) 108 (90  BASE) MCG/ACT inhaler Inhale 2 puffs into the lungs every 6 (six) hours as needed for wheezing or shortness of breath.   Yes [provider]  albuterol (PROVENTIL) (2.5 MG/3ML) 0.083% nebulizer solution Take 2.5 mg by nebulization every 4 (four) hours as needed for wheezing or shortness of breath.   Yes [provider]  amLODipine (NORVASC) 5 MG tablet Take 5 mg by mouth daily.  10/14/16  Yes [provider]  aspirin EC 81 MG tablet Take 162 mg by mouth daily.   Yes [provider]  Cholecalciferol (VITAMIN D3) 2000 units capsule Take 1 capsule (2,000 Units total) by mouth daily. 04/01/16  Yes Delano MetzNaveira, Francisco, MD  diazepam (VALIUM) 10 MG tablet Take 10 mg by mouth 3 (three) times daily. Reported on 07/15/2015   Yes [provider]  DULoxetine (CYMBALTA) 20 MG capsule Take 20 mg by mouth at bedtime.    Yes [provider]  lactulose (CHRONULAC) 10 GM/15ML solution 10 g 4 (four) times daily.  02/11/16  Yes [provider]  omeprazole (PRILOSEC) 40 MG capsule Take 40 mg by mouth daily.  01/10/13  Yes [provider]  oxyCODONE-acetaminophen (PERCOCET) 7.5-325 MG per tablet Take 1 tablet by mouth every 4 (four) hours as needed for pain.   Yes [provider]  OXYGEN Inhale 4 L into the lungs continuous.   Yes [provider]  simvastatin (ZOCOR) 20 MG tablet TAKE 1 TABLET BY MOUTH DAILY AT 6 PM. 09/21/15  Yes Laqueta LindenKoneswaran, Suresh A, MD  SYMBICORT 160-4.5 MCG/ACT inhaler Inhale 2 puffs into the lungs 2 (two) times daily. 12/21/15  Yes [provider]  fludrocortisone (FLORINEF) 0.1 MG tablet Take 0.1 mg by mouth daily.  11/01/16   [provider]  levofloxacin (LEVAQUIN) 500 MG tablet Take 500 mg by mouth daily. 10 DAY COURSE STARTING ON 10/30/2016 10/30/16   [provider]  Propylene Glycol (SYSTANE BALANCE) 0.6 % SOLN Place 1 drop into both eyes 2 (two) times daily as needed (dry eyes).    [provider]  tiZANidine (ZANAFLEX) 4 MG tablet 4 mg 3 (three) times daily.  03/17/16   [provider]    Physical Exam: Vitals:   11/11/16 2000 11/11/16 2007 11/11/16 2030 11/11/16 2100  BP: (!) 133/92  128/87 130/90  Pulse: (!) 111  (!) 112 (!) 112  Resp: (!) 26  19 17   Temp:      TempSrc:      SpO2: 95%  96% 96% 94%  Weight:      Height:          Constitutional: NAD, calm, comfortable Vitals:   11/11/16 2000 11/11/16 2007 11/11/16 2030 11/11/16 2100  BP: (!) 133/92  128/87 130/90  Pulse: (!) 111  (!) 112 (!) 112  Resp: (!) 26  19 17   Temp:      TempSrc:      SpO2: 95% 96% 96% 94%  Weight:      Height:       Eyes: PERRL, lids and conjunctivae normal ENMT: Mucous membranes are moist. Posterior pharynx clear of any exudate or lesions.Normal dentition.  Neck: normal, supple, no masses, no thyromegaly Respiratory: clear to auscultation bilaterally, no wheezing, no crackles. Normal respiratory effort. No accessory muscle use.  Cardiovascular: Regular rate and rhythm, no murmurs / rubs / gallops. No extremity edema. 2+ pedal pulses. No carotid bruits.  Abdomen: no tenderness, no masses palpated. No hepatosplenomegaly. Bowel sounds positive.  Musculoskeletal: no clubbing /  cyanosis. No joint deformity upper and lower extremities. Good ROM, no contractures. Normal muscle tone.  Skin: no rashes, lesions, ulcers. No induration Neurologic: CN 2-12 grossly intact. Sensation intact, DTR normal. Strength 5/5 in all 4.  Psychiatric: Normal judgment and insight. Alert and oriented x 3. Normal mood.     Labs on Admission: I have personally reviewed following labs and imaging studies  CBC:  Recent Labs Lab 11/11/16 1948  WBC 23.4*  NEUTROABS 20.5*  HGB 14.4  HCT 42.6  MCV 92.6  PLT 209   Basic Metabolic Panel:  Recent Labs Lab 11/11/16 1948  NA 136  K 4.0  CL 100*  CO2 28  GLUCOSE 100*  BUN 19  CREATININE 0.99  CALCIUM 8.6*   GFR: Cardiac Enzymes:  Recent Labs Lab 11/11/16 1948  TROPONINI <0.03   Urine analysis:    Component Value Date/Time   COLORURINE AMBER (A) 01/08/2016 0312   APPEARANCEUR CLEAR 01/08/2016 0312   LABSPEC >1.030 (H) 01/08/2016 0312   PHURINE 5.5 01/08/2016 0312   GLUCOSEU NEGATIVE 01/08/2016 0312   HGBUR NEGATIVE 01/08/2016 0312    BILIRUBINUR NEGATIVE 01/08/2016 0312   KETONESUR NEGATIVE 01/08/2016 0312   PROTEINUR NEGATIVE 01/08/2016 0312   NITRITE NEGATIVE 01/08/2016 0312   LEUKOCYTESUR NEGATIVE 01/08/2016 0312   Radiological Exams on Admission: Ct Angio Chest Pe W/cm &/or Wo Cm  Result Date: 11/11/2016 CLINICAL DATA:  Hemoptysis and dyspnea since midnight yesterday EXAM: CT ANGIOGRAPHY CHEST WITH CONTRAST TECHNIQUE: Multidetector CT imaging of the chest was performed using the standard protocol during bolus administration of intravenous contrast. Multiplanar CT image reconstructions and MIPs were obtained to evaluate the vascular anatomy. CONTRAST:  100 mL Isovue 370 intravenous COMPARISON:  03/23/2015 CT, 11/11/2016 radiographs. FINDINGS: Cardiovascular: Satisfactory opacification of the pulmonary arteries to the segmental level. No evidence of pulmonary embolism. Normal heart size. No pericardial effusion. Mediastinum/Nodes: Nonspecific mediastinal nodes, with several 10-11 mm short axis nodes in the AP window, a 1.4 cm node in the right peritracheal region and a 1.3 cm node in the subcarinal region. These are enlarged from 03/23/2015. Lungs/Pleura: 1.5 cm endobronchial mass versus retained secretions at the right lateral aspect of the distal trachea, at the origin of the right mainstem bronchus. This is seen to best advantage on coronal image 78 series 7 but is also visible on axial image 37 series 6. Diffuse interstitial and alveolar opacities throughout the right lung. This could represent pneumonia. Neoplasm less likely but not entirely excluded. Upper Abdomen: No acute findings. Musculoskeletal: No significant skeletal lesion. Review of the MIP images confirms the above findings. IMPRESSION: 1. Negative for acute pulmonary embolism 2. Endoluminal 1.5 cm mass versus masslike retained secretions, at the right lateral aspect of the distal trachea at the origin of the right mainstem bronchus. 3. Extensive opacities throughout  the right lung. Left lung is clear. Electronically Signed   By: Ellery Plunk M.D.   On: 11/11/2016 21:28   Dg Chest Port 1 View  Result Date: 11/11/2016 CLINICAL DATA:  Coughing up blood EXAM: PORTABLE CHEST 1 VIEW COMPARISON:  01/08/2016 FINDINGS: Asymmetric ground-glass density in the right thorax with more focal right apical opacity. No pleural effusion. Left lung is clear. Heart size is normal. No pneumothorax. IMPRESSION: Asymmetric ground-glass density throughout the right thorax, could reflect diffuse pneumonia. Electronically Signed   By: Jasmine Pang M.D.   On: 11/11/2016 19:49   Assessment/Plan Active Problems:   Dyslipidemia   CAP (community acquired pneumonia)   Acute respiratory  failure with hypoxia (HCC)   COPD exacerbation (HCC)   Chronic pain syndrome   Opiate use   PLAN:   CAP:  Will continue with duo IV antibiotics.  He doesn't need steroid at this time, so will hold off.  Continue with supplement oxygen, and nebs as required.  It is important to have follow up imaging after Tx for PNA given the appearance of his CT.  HLD:  Continue with statin.  Chronic pain syndrome:  Will continue with home narcotics.  HTN:  Will hold Norvasc at the time being.  No hypotension, but BP is not elevated.    DVT prophylaxis: SubQ Heparin.  Code Status: FULL CODE.  Family Communication: None at bedside.  Disposition Plan: Home.  Consults called: None.  Admission status: Inpatient.    Zayon Trulson MD FACP. [facility]  If 7PM-7AM, please contact night-coverage [email or web address] Password [facility]  11/11/2016, 9:55 PM

## 2016-11-11 NOTE — Progress Notes (Signed)
Pharmacy Note:  Initial antibiotics for Rocephin and Zithromax ordered by EDP for CAP.  Estimated Creatinine Clearance: 73.4 mL/min (by C-G formula based on SCr of 0.99 mg/dL).   Allergies  Allergen Reactions  . Ativan [Lorazepam] Other (See Comments)    Dry mouth sinus and breathing problems     Vitals:   11/11/16 2156 11/11/16 2200  BP: 127/89 137/90  Pulse: 100 (!) 102  Resp: 16 19  Temp:      Anti-infectives    Start     Dose/Rate Route Frequency Ordered Stop   11/11/16 2145  cefTRIAXone (ROCEPHIN) 1 g in dextrose 5 % 50 mL IVPB     1 g 100 mL/hr over 30 Minutes Intravenous  Once 11/11/16 2135     11/11/16 2145  azithromycin (ZITHROMAX) 500 mg in dextrose 5 % 250 mL IVPB     500 mg 250 mL/hr over 60 Minutes Intravenous  Once 11/11/16 2135        Plan: Initial doses of Rocephin 1gm and Zithromax ordered X 1 ordered by EDP to be given in ED. F/U admission orders for further dosing if therapy continued.  [name], Chairty Toman A, [facility] LL[facility]PH 11/11/2016 10:24 PM

## 2016-11-11 NOTE — ED Notes (Signed)
Gave EKG to Dr.Mcmanus 

## 2016-11-11 NOTE — ED Provider Notes (Signed)
[facility]-EM[facility]Y DEPT Provider Note   CSN: [id number] Arrival date & time: 11/11/16  1858     History   Chief Complaint Chief Complaint  Patient presents with  . Hemoptysis    HPI [name] is a 66 y.o. male.  HPI  Pt was seen at 1920.  Per pt, c/o gradual onset and worsening of persistent cough and SOB since last night.  Describes the cough as having "flecks of blood in it." Has been associated with right sided chest wall pain.  States he "is wondering if I have pneumonia again."  Denies CP/palpitations, no back pain, no abd pain, no N/V/D, no fevers, no rash.    Past Medical History:  Diagnosis Date  . Anxiety   . CAD (coronary artery disease)   . Chronic pain   . 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 Dyslipidemia   . History of tobacco abuse    80 pack year history   . Hypertension   . On home O2    4L N/C  . Opioid dependence (HCC)   . Respiratory failure [facility](HCC)     Patient Active Problem List   Diagnosis Date Noted  . Vitamin D insufficiency 04/01/2016  . Elevated C-reactive protein (CRP) 03/28/2016  . Chronic pain syndrome 03/26/2016  . Chronic bilateral low back pain without sciatica (R>L) 03/26/2016  . Long term current use of opiate analgesic 03/26/2016  . Long term prescription opiate use 03/26/2016  . Opiate use 03/26/2016  . Encounter for therapeutic drug level monitoring 03/26/2016  . Encounter for pain management planning 03/26/2016  . Chronic sacroiliac joint pain (B) (R>L) 03/26/2016  . Chronic hip pain, left 03/26/2016  . Chronic right hip pain 03/26/2016  . Chronic hip pain (B) (L>R) 03/26/2016  . Chronic left sacroiliac pain 03/26/2016  . Chronic right sacroiliac pain 03/26/2016  . HCAP (healthcare-associated pneumonia) 01/08/2016  . PNA (pneumonia) 12/25/2015  . Chronic respiratory failure with hypoxia (HCC) 12/25/2015  . COPD exacerbation (HCC)   . Sepsis (HCC) 07/03/2015  . CAP (community acquired  pneumonia) 07/03/2015  . Acute respiratory failure with hypoxia (HCC) 07/03/2015  . GERD (gastroesophageal reflux disease) 07/03/2015  . Coronary artery disease 10/13/2013  . Anxiety 10/13/2013  . Dyslipidemia 06/23/2013  . COPD (chronic obstructive pulmonary disease) (HCC) 06/23/2013  . SHOULDER PAIN 08/16/2008  . SHOULDER IMPINGEMENT SYNDROME, LEFT 08/16/2008    Past Surgical History:  Procedure Laterality Date  . CARDIAC CATHETERIZATION  08/10/2008   right & left - normal coronaries (Dr. Claudia DesanctisH. Solomon)  . NM MYOCAR PERF WALL MOTION  07/2008   bruce myoview - mild ischemia in basal inferoseptal & mid inferoseptal regions; EF 63%  . TRANSTHORACIC ECHOCARDIOGRAM  07/2008   EF=>55%; RV mildly dilated; RA mild-mod dilated; mild mitral annular calcification & mild MR; AV mildly sclerotic       Home Medications    Prior to Admission medications   Medication Sig Start Date End Date Taking? Authorizing Provider  albuterol (PROVENTIL HFA;VENTOLIN HFA) 108 (90 BASE) MCG/ACT inhaler Inhale 2 puffs into the lungs every 6 (six) hours as needed for wheezing or shortness of breath.    [provider]  albuterol (PROVENTIL) (2.5 MG/3ML) 0.083% nebulizer solution Take 2.5 mg by nebulization every 4 (four) hours as needed for wheezing or shortness of breath.    [provider]  Aromatic Inhalants (VICKS VAPOR INHALER IN) Place 1 puff into both nostrils daily as needed (congestion).  [provider]  Cholecalciferol (VITAMIN D3) 2000 units capsule Take 1 capsule (2,000 Units total) by mouth daily. 04/01/16   Delano Metz, MD  diazepam (VALIUM) 10 MG tablet Take 10 mg by mouth 3 (three) times daily. Reported on 07/15/2015    [provider]  DULoxetine (CYMBALTA) 20 MG capsule Take 20 mg by mouth daily.    [provider]  lactulose (CHRONULAC) 10 GM/15ML solution 10 g 4 (four) times daily.  02/11/16   [provider]  levofloxacin (LEVAQUIN)  500 MG tablet  12/26/15   [provider]  omeprazole (PRILOSEC) 40 MG capsule Take 40 mg by mouth daily.  01/10/13   [provider]  oxyCODONE-acetaminophen (PERCOCET) 7.5-325 MG per tablet Take 1 tablet by mouth every 4 (four) hours as needed for pain.    [provider]  Propylene Glycol (SYSTANE BALANCE) 0.6 % SOLN Place 1 drop into both eyes 2 (two) times daily as needed (dry eyes).    [provider]  simvastatin (ZOCOR) 20 MG tablet TAKE 1 TABLET BY MOUTH DAILY AT 6 PM. 09/21/15   Laqueta Linden, MD  SYMBICORT 160-4.5 MCG/ACT inhaler Inhale 2 puffs into the lungs 2 (two) times daily. 12/21/15   [provider]  tiZANidine (ZANAFLEX) 4 MG tablet 4 mg 3 (three) times daily.  03/17/16   [provider]  Vitamin D, Ergocalciferol, (DRISDOL) 50000 units CAPS capsule Take 1 capsule (50,000 Units total) by mouth 2 (two) times a week. X 6 weeks. 04/01/16   Delano Metz, MD    Family History Family History  Problem Relation Age of Onset  . Leukemia Mother   . Suicidality Father     Social History Social History  Substance Use Topics  . Smoking status: Former Smoker    Start date: 04/21/1974    Quit date: 06/22/2007  . Smokeless tobacco: Never Used  . Alcohol use No     Allergies   Ativan [lorazepam]   Review of Systems Review of Systems ROS: Statement: All systems negative except as marked or noted in the HPI; Constitutional: Negative for fever and chills. ; ; Eyes: Negative for eye pain, redness and discharge. ; ; ENMT: Negative for ear pain, hoarseness, nasal congestion, sinus pressure and sore throat. ; ; Cardiovascular: +right sided chest wall pain. Negative for palpitations, diaphoresis, and peripheral edema. ; ; Respiratory: +cough, SOB. Negative for wheezing and stridor. ; ; Gastrointestinal: Negative for nausea, vomiting, diarrhea, abdominal pain, blood in stool, hematemesis, jaundice and rectal bleeding. . ; ;  Genitourinary: Negative for dysuria, flank pain and hematuria. ; ; Musculoskeletal: Negative for back pain and neck pain. Negative for swelling and trauma.; ; Skin: Negative for pruritus, rash, abrasions, blisters, bruising and skin lesion.; ; Neuro: Negative for headache, lightheadedness and neck stiffness. Negative for weakness, altered level of consciousness, altered mental status, extremity weakness, paresthesias, involuntary movement, seizure and syncope.     Physical Exam Updated Vital Signs BP 132/88 (BP Location: Right Arm)   Pulse (!) 122   Temp 98.4 F (36.9 C) (Oral)   Resp 20   Ht 5\' 9"  (1.753 m)   Wt 79.8 kg (176 lb)   SpO2 90%   BMI 25.99 kg/m   Physical Exam 1925: Physical examination:  Nursing notes reviewed; Vital signs and O2 SAT reviewed;  Constitutional: Well developed, Well nourished, Well hydrated, Uncomfortable appearing.;; Head:  Normocephalic, atraumatic; Eyes: EOMI, PERRL, No scleral icterus; ENMT: Mouth and pharynx normal, Mucous membranes moist; Neck:  Supple, Full range of motion, No lymphadenopathy; Cardiovascular: Tachycardic rate and rhythm, No gallop; Respiratory: Breath sounds diminished & equal bilaterally. No audible wheezing.  Speaking short sentences, sitting upright, tachypneic.; Chest: Nontender, Movement normal; Abdomen: Soft, Nontender, Nondistended, Normal bowel sounds; Genitourinary: No CVA tenderness; Extremities: Pulses normal, No tenderness, No edema, No calf edema or asymmetry.; Neuro: AA&Ox3, Major CN grossly intact.  Speech clear. No gross focal motor or sensory deficits in extremities.; Skin: Color normal, Warm, Dry.    ED Treatments / Results  Labs (all labs ordered are listed, but only abnormal results are displayed)   EKG  EKG Interpretation None       Radiology   Procedures Procedures (including critical care time)  Medications Ordered in ED Medications  ipratropium-albuterol (DUONEB) 0.5-2.5 (3) MG/3ML nebulizer solution  3 mL (not administered)  albuterol (PROVENTIL) (2.5 MG/3ML) 0.083% nebulizer solution 2.5 mg (not administered)     Initial Impression / Assessment and Plan / ED Course  I have reviewed the triage vital signs and the nursing notes.  Pertinent labs & imaging results that were available during my care of the patient were reviewed by me and considered in my medical decision making (see chart for details).  MDM Reviewed: previous chart, nursing note and vitals Reviewed previous: labs and ECG Interpretation: labs, ECG, x-ray and CT scan   ED ECG REPORT   Date: 11/11/2016  Rate: 107  Rhythm: sinus tachycardia  QRS Axis: left  Intervals: normal  ST/T Wave abnormalities: normal  Conduction Disutrbances:none  Narrative Interpretation:   Old EKG Reviewed: changes noted; rate slower compared to previous EKG dated 01/08/2016. I have personally reviewed the EKG tracing and agree with the computerized printout as noted.  Results for orders placed or performed during the hospital encounter of 11/11/16  Basic metabolic panel  Result Value Ref Range   Sodium 136 135 - 145 mmol/L   Potassium 4.0 3.5 - 5.1 mmol/L   Chloride 100 (L) 101 - 111 mmol/L   CO2 28 22 - 32 mmol/L   Glucose, Bld 100 (H) 65 - 99 mg/dL   BUN 19 6 - 20 mg/dL   Creatinine, Ser 0.98 0.61 - 1.24 mg/dL   Calcium 8.6 (L) 8.9 - 10.3 mg/dL   GFR calc non Af Amer >60 >60 mL/min   GFR calc Af Amer >60 >60 mL/min   Anion gap 8 5 - 15  Troponin I  Result Value Ref Range   Troponin I <0.03 <0.03 ng/mL  Lactic acid, plasma  Result Value Ref Range   Lactic Acid, Venous 1.6 0.5 - 1.9 mmol/L  CBC with Differential  Result Value Ref Range   WBC 23.4 (H) 4.0 - 10.5 K/uL   RBC 4.60 4.22 - 5.81 MIL/uL   Hemoglobin 14.4 13.0 - 17.0 g/dL   HCT 11.9 14.7 - 82.9 %   MCV 92.6 78.0 - 100.0 fL   MCH 31.3 26.0 - 34.0 pg   MCHC 33.8 30.0 - 36.0 g/dL   RDW 56.2 13.0 - 86.5 %   Platelets 209 150 - 400 K/uL   Neutrophils Relative % 87 %    Neutro Abs 20.5 (H) 1.7 - 7.7 K/uL   Lymphocytes Relative 8 %   Lymphs Abs 1.8 0.7 - 4.0 K/uL   Monocytes Relative 5 %   Monocytes Absolute 1.1 (H) 0.1 - 1.0 K/uL   Eosinophils Relative 0 %   Eosinophils Absolute 0.0 0.0 - 0.7 K/uL   Basophils Relative 0 %  Basophils Absolute 0.0 0.0 - 0.1 K/uL   Ct Angio Chest Pe W/cm &/or Wo Cm Result Date: 11/11/2016 CLINICAL DATA:  Hemoptysis and dyspnea since midnight yesterday EXAM: CT ANGIOGRAPHY CHEST WITH CONTRAST TECHNIQUE: Multidetector CT imaging of the chest was performed using the standard protocol during bolus administration of intravenous contrast. Multiplanar CT image reconstructions and MIPs were obtained to evaluate the vascular anatomy. CONTRAST:  100 mL Isovue 370 intravenous COMPARISON:  03/23/2015 CT, 11/11/2016 radiographs. FINDINGS: Cardiovascular: Satisfactory opacification of the pulmonary arteries to the segmental level. No evidence of pulmonary embolism. Normal heart size. No pericardial effusion. Mediastinum/Nodes: Nonspecific mediastinal nodes, with several 10-11 mm short axis nodes in the AP window, a 1.4 cm node in the right peritracheal region and a 1.3 cm node in the subcarinal region. These are enlarged from 03/23/2015. Lungs/Pleura: 1.5 cm endobronchial mass versus retained secretions at the right lateral aspect of the distal trachea, at the origin of the right mainstem bronchus. This is seen to best advantage on coronal image 78 series 7 but is also visible on axial image 37 series 6. Diffuse interstitial and alveolar opacities throughout the right lung. This could represent pneumonia. Neoplasm less likely but not entirely excluded. Upper Abdomen: No acute findings. Musculoskeletal: No significant skeletal lesion. Review of the MIP images confirms the above findings. IMPRESSION: 1. Negative for acute pulmonary embolism 2. Endoluminal 1.5 cm mass versus masslike retained secretions, at the right lateral aspect of the distal trachea  at the origin of the right mainstem bronchus. 3. Extensive opacities throughout the right lung. Left lung is clear. Electronically Signed   By: Ellery Plunk M.D.   On: 11/11/2016 21:28   Dg Chest Port 1 View Result Date: 11/11/2016 CLINICAL DATA:  Coughing up blood EXAM: PORTABLE CHEST 1 VIEW COMPARISON:  01/08/2016 FINDINGS: Asymmetric ground-glass density in the right thorax with more focal right apical opacity. No pleural effusion. Left lung is clear. Heart size is normal. No pneumothorax. IMPRESSION: Asymmetric ground-glass density throughout the right thorax, could reflect diffuse pneumonia. Electronically Signed   By: Jasmine Pang M.D.   On: 11/11/2016 19:49    1925:  Pt not wearing his home O2 on arrival to ED. Pt's usual home O2 N/C 4L placed, short neb ordered.   2140:  CT as above. CAP abx started as well as 2nd short neb given. Dx and testing d/w pt.  Questions answered.  Verb understanding, agreeable to admit. T/C to [facility]. Conley Rolls, case discussed, including:  HPI, pertinent PM/SHx, VS/PE, dx testing, ED course and treatment:  Agreeable to admit.    Final Clinical Impressions(s) / ED Diagnoses   Final diagnoses:  None    New Prescriptions New Prescriptions   No medications on file     Samuel Jester, DO 11/14/16 0840

## 2016-11-11 NOTE — ED Triage Notes (Signed)
Pt c/o coughing up blood since midnight.

## 2016-11-11 NOTE — ED Notes (Signed)
Gave EKG to DR.Man

## 2016-11-11 NOTE — ED Notes (Signed)
Date and time results received: 11/11/16 10:48 PM   Test: lactic acid Critical Value: 2.6  Name of Provider Notified: Dayna RamusP. Le  Orders Received? Or Actions Taken?: no new orders

## 2016-11-12 DIAGNOSIS — D72829 Elevated white blood cell count, unspecified: Secondary | ICD-10-CM

## 2016-11-12 DIAGNOSIS — J398 Other specified diseases of upper respiratory tract: Secondary | ICD-10-CM | POA: Diagnosis present

## 2016-11-12 DIAGNOSIS — J9601 Acute respiratory failure with hypoxia: Secondary | ICD-10-CM

## 2016-11-12 LAB — CBC
HEMATOCRIT: 40.1 % (ref 39.0–52.0)
HEMOGLOBIN: 13.4 g/dL (ref 13.0–17.0)
MCH: 30.9 pg (ref 26.0–34.0)
MCHC: 33.4 g/dL (ref 30.0–36.0)
MCV: 92.6 fL (ref 78.0–100.0)
Platelets: 197 10*3/uL (ref 150–400)
RBC: 4.33 MIL/uL (ref 4.22–5.81)
RDW: 13.6 % (ref 11.5–15.5)
WBC: 19.2 10*3/uL — ABNORMAL HIGH (ref 4.0–10.5)

## 2016-11-12 LAB — COMPREHENSIVE METABOLIC PANEL
ALBUMIN: 3.4 g/dL — AB (ref 3.5–5.0)
ALT: 15 U/L — ABNORMAL LOW (ref 17–63)
ANION GAP: 9 (ref 5–15)
AST: 15 U/L (ref 15–41)
Alkaline Phosphatase: 60 U/L (ref 38–126)
BUN: 14 mg/dL (ref 6–20)
CHLORIDE: 101 mmol/L (ref 101–111)
CO2: 27 mmol/L (ref 22–32)
Calcium: 8.5 mg/dL — ABNORMAL LOW (ref 8.9–10.3)
Creatinine, Ser: 0.78 mg/dL (ref 0.61–1.24)
GFR calc non Af Amer: >60 mL/min (ref >60)
Glucose, Bld: 98 mg/dL (ref 65–99)
POTASSIUM: 3.6 mmol/L (ref 3.5–5.1)
SODIUM: 137 mmol/L (ref 135–145)
Total Bilirubin: 1.1 mg/dL (ref 0.3–1.2)
Total Protein: 6 g/dL — ABNORMAL LOW (ref 6.5–8.1)

## 2016-11-12 MED ORDER — DEXTROSE 5 % IV SOLN
2.0000 g | INTRAVENOUS | Status: DC
  Administered 2016-11-12 – 2016-11-13 (×2): 2 g via INTRAVENOUS
  Filled 2016-11-12 (×5): qty 2

## 2016-11-12 MED ORDER — DIAZEPAM 5 MG PO TABS: 5.0000 mg | ORAL_TABLET | Freq: Once | ORAL | Status: DC

## 2016-11-12 MED ORDER — DEXTROSE 5 % IV SOLN
500.0000 mg | INTRAVENOUS | Status: DC
  Administered 2016-11-12 – 2016-11-13 (×2): 500 mg via INTRAVENOUS
  Filled 2016-11-12 (×5): qty 500

## 2016-11-12 MED ORDER — DIAZEPAM 5 MG PO TABS
5.0000 mg | ORAL_TABLET | Freq: Once | ORAL | Status: AC
  Administered 2016-11-12: 5 mg via ORAL
  Filled 2016-11-12: qty 1

## 2016-11-12 NOTE — Progress Notes (Signed)
Pharmacy Antibiotic Note  Fabio NeighborsRoy L Thede is a 66 y.o. male admitted on 11/11/2016 with pneumonia.  Pharmacy has been consulted for Rocephin and Zithromax dosing.  Plan: Rocephin 2gm IV q24hrs Zithromax 500mg  IV q24hrs (switch to PO when appropriate) Monitor labs, progress, c/s  Height: 5\' 9"  (175.3 cm) Weight: 177 lb 0.5 oz (80.3 kg) IBW/kg (Calculated) : 70.7  Temp (24hrs), Avg:98.1 F (36.7 C), Min:97.7 F (36.5 C), Max:98.4 F (36.9 C)   Recent Labs Lab 11/11/16 1948 11/11/16 2209 11/12/16 0439  WBC 23.4*  --  19.2*  CREATININE 0.99  --  0.78  LATICACIDVEN 1.6 2.6*  --     Estimated Creatinine Clearance: 90.8 mL/min (by C-G formula based on SCr of 0.78 mg/dL).    Allergies  Allergen Reactions  . Ativan [Lorazepam] Other (See Comments)    Dry mouth sinus and breathing problems    Antimicrobials this admission: Rocephin 7/24 >>  Zithromax 7/24 >>   Dose adjustments this admission:  Microbiology results:  BCx: pending  UCx: pending   Sputum:    MRSA PCR:   Thank you for allowing pharmacy to be a part of this patient's care.  Valrie HartHall, Aubert Choyce A 11/12/2016 7:36 AM

## 2016-11-12 NOTE — Progress Notes (Addendum)
[facility] PROGRESS NOTE    Progress Note  [name] Hamelin  ZOX:[id number] DOB: [birth year] DOA: 11/11/2016 PCP: Elfredia NevinsFusco, Lawrence, MD     Brief Narrative:   [name] Ciano is an 66 y.o. male past medical history of COPD on home oxygen, or. Dependent presents to the ED with shortness of breath and hypoxia in the ER was found to have a white count of 23,000, lactic acid less than 2 CT scan of the chest showed no PE but showed extensive right-sided pneumonia.  Assessment/Plan:   Acute respiratory failure with hypoxia (HCC) due to CAP (community acquired pneumonia)/  COPD exacerbation (HCC)/hemoptysis.:  He has remained afebrile. His leukocytosis is improving. Continue IV Rocephin and azithromycin. Lungs are clear no wheezing will continue current regimen. Cultures ans serolgy are pending.  Dyslipidemia Continue statins.  Chronic pain syndrome/Opiate use Continue current home regimen.   DVT prophylaxis: lovenox Family Communication:none Disposition Plan/Barrier to D/C: home in 2-3 days Code Status:     Code Status Orders        Start     Ordered   11/11/16 2324  Full code  Continuous     11/11/16 2323    Code Status History    Date Active Date Inactive Code Status Order ID Comments User Context   01/08/2016  6:07 AM 01/10/2016  7:59 PM Full Code [id number]  Houston SirenLe, Peter, MD Inpatient   12/25/2015  8:09 AM 12/26/2015  4:31 PM Full Code [id number]  Haydee Monicaavid, Rachal A, MD Inpatient   07/03/2015 10:45 AM 07/06/2015  7:03 PM Full Code [id number]  Erick BlinksMemon, Jehanzeb, MD Inpatient        IV Access:    Peripheral IV   Procedures and diagnostic studies:   Ct Angio Chest Pe W/cm &/or Wo Cm  Result Date: 11/11/2016 CLINICAL DATA:  Hemoptysis and dyspnea since midnight yesterday EXAM: CT ANGIOGRAPHY CHEST WITH CONTRAST TECHNIQUE: Multidetector CT imaging of the chest was performed using the standard protocol during bolus administration of intravenous contrast. Multiplanar CT image  reconstructions and MIPs were obtained to evaluate the vascular anatomy. CONTRAST:  100 mL Isovue 370 intravenous COMPARISON:  03/23/2015 CT, 11/11/2016 radiographs. FINDINGS: Cardiovascular: Satisfactory opacification of the pulmonary arteries to the segmental level. No evidence of pulmonary embolism. Normal heart size. No pericardial effusion. Mediastinum/Nodes: Nonspecific mediastinal nodes, with several 10-11 mm short axis nodes in the AP window, a 1.4 cm node in the right peritracheal region and a 1.3 cm node in the subcarinal region. These are enlarged from 03/23/2015. Lungs/Pleura: 1.5 cm endobronchial mass versus retained secretions at the right lateral aspect of the distal trachea, at the origin of the right mainstem bronchus. This is seen to best advantage on coronal image 78 series 7 but is also visible on axial image 37 series 6. Diffuse interstitial and alveolar opacities throughout the right lung. This could represent pneumonia. Neoplasm less likely but not entirely excluded. Upper Abdomen: No acute findings. Musculoskeletal: No significant skeletal lesion. Review of the MIP images confirms the above findings. IMPRESSION: 1. Negative for acute pulmonary embolism 2. Endoluminal 1.5 cm mass versus masslike retained secretions, at the right lateral aspect of the distal trachea at the origin of the right mainstem bronchus. 3. Extensive opacities throughout the right lung. Left lung is clear. Electronically Signed   By: Ellery Plunkaniel R Mitchell M.D.   On: 11/11/2016 21:28   Dg Chest Port 1 View  Result Date: 11/11/2016 CLINICAL DATA:  Coughing up blood EXAM: PORTABLE CHEST 1 VIEW COMPARISON:  01/08/2016 FINDINGS: Asymmetric ground-glass density in the right thorax with more focal right apical opacity. No pleural effusion. Left lung is clear. Heart size is normal. No pneumothorax. IMPRESSION: Asymmetric ground-glass density throughout the right thorax, could reflect diffuse pneumonia. Electronically Signed    By: Jasmine PangKim  Fujinaga M.D.   On: 11/11/2016 19:49     Medical Consultants:    None.  Anti-Infectives:   Rocephin and azithromycin.  Subjective:    Edward Neighborsoy [name] Strickland he relates his breathing is slightly better than yesterday. He is having hemoptysis.  Objective:    Vitals:   11/11/16 2326 11/12/16 0215 11/12/16 0430 11/12/16 0736  BP: (!) 141/87  103/63   Pulse: (!) 104  88   Resp:   20   Temp: 97.7 F (36.5 C)  98.3 F (36.8 C)   TempSrc: Oral  Oral   SpO2: 96% 93% 94% (!) 85%  Weight: 80.3 kg (177 lb 0.5 oz)     Height: 5\' 9"  (1.753 m)       Intake/Output Summary (Last 24 hours) at 11/12/16 1148 Last data filed at 11/11/16 2252  Gross per 24 hour  Intake              800 ml  Output                0 ml  Net              800 ml   Filed Weights   11/11/16 1916 11/11/16 2326  Weight: 79.8 kg (176 lb) 80.3 kg (177 lb 0.5 oz)    Exam: General exam: In no acute distress. Respiratory system: Poor air movement and wheezing. Cardiovascular system: Regular rate and rhythm with positive S1-S2. Gastrointestinal system: Abdomen is soft nontender distended. Central nervous system: Alert, oriented 3 nonfocal. Extremities: No lower extremity edema. Skin: No rashes. Psychiatry: Judgment and insight appeared normal mood and affect are appropriate.   Data Reviewed:    Labs: Basic Metabolic Panel:  Recent Labs Lab 11/11/16 1948 11/12/16 0439  NA 136 137  K 4.0 3.6  CL 100* 101  CO2 28 27  GLUCOSE 100* 98  BUN 19 14  CREATININE 0.99 0.78  CALCIUM 8.6* 8.5*   GFR Estimated Creatinine Clearance: 90.8 mL/min (by C-G formula based on SCr of 0.78 mg/dL). Liver Function Tests:  Recent Labs Lab 11/12/16 0439  AST 15  ALT 15*  ALKPHOS 60  BILITOT 1.1  PROT 6.0*  ALBUMIN 3.4*   No results for input(s): LIPASE, AMYLASE in the last 168 hours. No results for input(s): AMMONIA in the last 168 hours. Coagulation profile No results for input(s): INR, PROTIME in the  last 168 hours.  CBC:  Recent Labs Lab 11/11/16 1948 11/12/16 0439  WBC 23.4* 19.2*  NEUTROABS 20.5*  --   HGB 14.4 13.4  HCT 42.6 40.1  MCV 92.6 92.6  PLT 209 197   Cardiac Enzymes:  Recent Labs Lab 11/11/16 1948  TROPONINI <0.03   BNP (last 3 results) No results for input(s): PROBNP in the last 8760 hours. CBG: No results for input(s): GLUCAP in the last 168 hours. D-Dimer: No results for input(s): DDIMER in the last 72 hours. Hgb A1c: No results for input(s): HGBA1C in the last 72 hours. Lipid Profile: No results for input(s): CHOL, HDL, LDLCALC, TRIG, CHOLHDL, LDLDIRECT in the last 72 hours. Thyroid function studies: No results for input(s): TSH, T4TOTAL, T3FREE, THYROIDAB in the last 72 hours.  Invalid input(s): FREET3 Anemia work  up: No results for input(s): VITAMINB12, FOLATE, FERRITIN, TIBC, IRON, RETICCTPCT in the last 72 hours. Sepsis Labs:  Recent Labs Lab 11/11/16 1948 11/11/16 2209 11/12/16 0439  WBC 23.4*  --  19.2*  LATICACIDVEN 1.6 2.6*  --    Microbiology No results found for this or any previous visit (from the past 240 hour(s)).   Medications:   . albuterol  2.5 mg Nebulization Q6H  . aspirin EC  162 mg Oral Daily  . DULoxetine  20 mg Oral QHS  . fludrocortisone  0.1 mg Oral Daily  . heparin  5,000 Units Subcutaneous Q8H  . mometasone-formoterol  2 puff Inhalation BID  . pantoprazole  40 mg Oral Daily  . simvastatin  20 mg Oral q1800  . sodium chloride flush  3 mL Intravenous Q12H   Continuous Infusions: . azithromycin    . cefTRIAXone (ROCEPHIN)  IV       LOS: 1 day   Marinda Elk  [facility] Pager [phone]  *Please refer to [email or web address], password [email or web address] to get updated schedule on who will round on this patient, as hospitalists switch teams weekly. If 7PM-7AM, please contact night-coverage at [email or web address], password [email or web address] for any overnight needs.  11/12/2016, 11:48 AM

## 2016-11-13 DIAGNOSIS — Z79891 Long term (current) use of opiate analgesic: Secondary | ICD-10-CM

## 2016-11-13 DIAGNOSIS — J9621 Acute and chronic respiratory failure with hypoxia: Secondary | ICD-10-CM

## 2016-11-13 DIAGNOSIS — G894 Chronic pain syndrome: Secondary | ICD-10-CM

## 2016-11-13 DIAGNOSIS — J441 Chronic obstructive pulmonary disease with (acute) exacerbation: Secondary | ICD-10-CM

## 2016-11-13 DIAGNOSIS — J189 Pneumonia, unspecified organism: Principal | ICD-10-CM

## 2016-11-13 DIAGNOSIS — R042 Hemoptysis: Secondary | ICD-10-CM

## 2016-11-13 MED ORDER — BENZONATATE 100 MG PO CAPS: 100.0000 mg | ORAL_CAPSULE | Freq: Three times a day (TID) | ORAL | Status: DC | PRN

## 2016-11-13 MED ORDER — AMLODIPINE BESYLATE 5 MG PO TABS
5.0000 mg | ORAL_TABLET | Freq: Every day | ORAL | Status: DC
  Administered 2016-11-14: 5 mg via ORAL
  Filled 2016-11-13: qty 1

## 2016-11-13 NOTE — Progress Notes (Signed)
PROGRESS NOTE    [name]  RUE:[id number] DOB: 1950-11-29 DOA: 11/11/2016 PCP: Elfredia NevinsFusco, Lawrence, MD    Brief Narrative:  Patient is a 66 y.o. male with hx of COPD on home oxygen,  HTN, HLD, chronic pain syndrome, opoid dependence, who presented to the ED with increased SOB, DOE, and hypoxia.  Evaluation in the ER showed marked leukocytosis of 23K (suspect from a questionable steroid injection given in Dr Sharyon MedicusFusco's office), and negative lactic acid.  His CT of the chest revealed no PE, but extensive PNA on the right side, along with either mass or dense consolidation.  He had a negative UA, negative troponin, and normal renal fx tests.  He was started on IV rocephin and Zithromax, and the hospitalist was asked to admit him for CAP and COPD exacerbation with hypoxic respiratory failure.    Assessment & Plan:   Principal Problem:   CAP (community acquired pneumonia) Active Problems:   COPD exacerbation (HCC)   Acute on chronic respiratory failure with hypoxia (HCC)   Hemoptysis   Dyslipidemia   Chronic pain syndrome   Chronic bilateral low back pain without sciatica (R>L)   Long term current use of opiate analgesic   1. Community-acquired pneumonia. Patient was started on supportive treatment, IV azithromycin, and IV Rocephin. He was continued on oxygen. -His white blood cell count which was 23.4 and admission decreased to 19.2 on 7/25. (No labs ordered today). -We'll continue supportive treatment and antibiotics. Will check labs on 7/27.  COPD with mild exacerbation. Patient is treated with albuterol inhaler/nebulizer, oxygen, and Symbicort at home. -He was restarted on scheduled albuterol nebulizer, oxygen, and substituted Dulera inhaler for Symbicort. -There is not a significant wheezing component, so we'll hold off on systemic steroid. Of note, the patient may have received an IM injection of Solu-Medrol in his PCPs office.  Acute on chronic respiratory failure secondary to CAP  and COPD. -Patient is treated with 4 L nasal cannula oxygen continuously at home. -Management/treatment as above. -Patient's oxygen saturation is maintaining in the mid 90s on 4 L nasal cannula oxygen.  Hemoptysis. Patient reported blood-tinged sputum. This was likely from pneumonia. -Currently resolved.  Essential hypertension. Patient is treated chronically with amlodipine. It was held on admission due to soft blood pressures. -We'll restart amlodipine as his blood pressure is trending up.  Chronic pain syndrome secondary to chronic low back pain/on chronic opiate analgesics. -Patient is treated chronically with Percocet as needed. It was continued.  Chronic anxiety. Patient is treated chronically with    DVT prophylaxis: Subcutaneous heparin Code Status: Full code Family Communication: Discussed with wife Disposition Plan: Discharge when clinically appropriate, likely in 1-2 days.   Consultants:   None  Procedures:    None  Antimicrobials:   Azithromycin 7/24>>  Rocephin 7/24>>   Subjective: Patient says that he is breathing a little better. No longer coughing up blood.  Objective: Vitals:   11/12/16 2149 11/13/16 0457 11/13/16 0807 11/13/16 0810  BP: (!) 107/59 131/78    Pulse: 93 83    Resp: 18 18    Temp: 98.4 F (36.9 C) 98.2 F (36.8 C)    TempSrc: Oral Oral    SpO2: 96% 100% 97% 97%  Weight:      Height:        Intake/Output Summary (Last 24 hours) at 11/13/16 1051 Last data filed at 11/13/16 0630  Gross per 24 hour  Intake  796 ml  Output              425 ml  Net              371 ml   Filed Weights   11/11/16 1916 11/11/16 2326  Weight: 79.8 kg (176 lb) 80.3 kg (177 lb 0.5 oz)    Examination:  General exam: Appears calm and comfortable  Respiratory system: Coarse breath sounds with a few crackles on the right Respiratory effort normal. Cardiovascular system: S1 & S2 heard, RRR. No JVD, murmurs, rubs, gallops or clicks.  No pedal edema. Gastrointestinal system: Abdomen is nondistended, soft and nontender. No organomegaly or masses felt. Normal bowel sounds heard. Central nervous system: Alert and oriented. No focal neurological deficits. Extremities: Symmetric 5 x 5 power. Skin: No rashes, lesions or ulcers Psychiatry: Judgement and insight appear normal. Mood & affect appropriate.     Data Reviewed: I have personally reviewed following labs and imaging studies  CBC:  Recent Labs Lab 11/11/16 1948 11/12/16 0439  WBC 23.4* 19.2*  NEUTROABS 20.5*  --   HGB 14.4 13.4  HCT 42.6 40.1  MCV 92.6 92.6  PLT 209 197   Basic Metabolic Panel:  Recent Labs Lab 11/11/16 1948 11/12/16 0439  NA 136 137  K 4.0 3.6  CL 100* 101  CO2 28 27  GLUCOSE 100* 98  BUN 19 14  CREATININE 0.99 0.78  CALCIUM 8.6* 8.5*   GFR: Estimated Creatinine Clearance: 90.8 mL/min (by C-G formula based on SCr of 0.78 mg/dL). Liver Function Tests:  Recent Labs Lab 11/12/16 0439  AST 15  ALT 15*  ALKPHOS 60  BILITOT 1.1  PROT 6.0*  ALBUMIN 3.4*   No results for input(s): LIPASE, AMYLASE in the last 168 hours. No results for input(s): AMMONIA in the last 168 hours. Coagulation Profile: No results for input(s): INR, PROTIME in the last 168 hours. Cardiac Enzymes:  Recent Labs Lab 11/11/16 1948  TROPONINI <0.03   BNP (last 3 results) No results for input(s): PROBNP in the last 8760 hours. HbA1C: No results for input(s): HGBA1C in the last 72 hours. CBG: No results for input(s): GLUCAP in the last 168 hours. Lipid Profile: No results for input(s): CHOL, HDL, LDLCALC, TRIG, CHOLHDL, LDLDIRECT in the last 72 hours. Thyroid Function Tests: No results for input(s): TSH, T4TOTAL, FREET4, T3FREE, THYROIDAB in the last 72 hours. Anemia Panel: No results for input(s): VITAMINB12, FOLATE, FERRITIN, TIBC, IRON, RETICCTPCT in the last 72 hours. Sepsis Labs:  Recent Labs Lab 11/11/16 1948 11/11/16 2209    LATICACIDVEN 1.6 2.6*    No results found for this or any previous visit (from the past 240 hour(s)).       Radiology Studies: Ct Angio Chest Pe W/cm &/or Wo Cm  Result Date: 11/11/2016 CLINICAL DATA:  Hemoptysis and dyspnea since midnight yesterday EXAM: CT ANGIOGRAPHY CHEST WITH CONTRAST TECHNIQUE: Multidetector CT imaging of the chest was performed using the standard protocol during bolus administration of intravenous contrast. Multiplanar CT image reconstructions and MIPs were obtained to evaluate the vascular anatomy. CONTRAST:  100 mL Isovue 370 intravenous COMPARISON:  03/23/2015 CT, 11/11/2016 radiographs. FINDINGS: Cardiovascular: Satisfactory opacification of the pulmonary arteries to the segmental level. No evidence of pulmonary embolism. Normal heart size. No pericardial effusion. Mediastinum/Nodes: Nonspecific mediastinal nodes, with several 10-11 mm short axis nodes in the AP window, a 1.4 cm node in the right peritracheal region and a 1.3 cm node in the subcarinal region. These are enlarged  from 03/23/2015. Lungs/Pleura: 1.5 cm endobronchial mass versus retained secretions at the right lateral aspect of the distal trachea, at the origin of the right mainstem bronchus. This is seen to best advantage on coronal image 78 series 7 but is also visible on axial image 37 series 6. Diffuse interstitial and alveolar opacities throughout the right lung. This could represent pneumonia. Neoplasm less likely but not entirely excluded. Upper Abdomen: No acute findings. Musculoskeletal: No significant skeletal lesion. Review of the MIP images confirms the above findings. IMPRESSION: 1. Negative for acute pulmonary embolism 2. Endoluminal 1.5 cm mass versus masslike retained secretions, at the right lateral aspect of the distal trachea at the origin of the right mainstem bronchus. 3. Extensive opacities throughout the right lung. Left lung is clear. Electronically Signed   By: Ellery Plunk M.D.    On: 11/11/2016 21:28   Dg Chest Port 1 View  Result Date: 11/11/2016 CLINICAL DATA:  Coughing up blood EXAM: PORTABLE CHEST 1 VIEW COMPARISON:  01/08/2016 FINDINGS: Asymmetric ground-glass density in the right thorax with more focal right apical opacity. No pleural effusion. Left lung is clear. Heart size is normal. No pneumothorax. IMPRESSION: Asymmetric ground-glass density throughout the right thorax, could reflect diffuse pneumonia. Electronically Signed   By: Jasmine Pang M.D.   On: 11/11/2016 19:49        Scheduled Meds: . albuterol  2.5 mg Nebulization Q6H  . aspirin EC  162 mg Oral Daily  . DULoxetine  20 mg Oral QHS  . fludrocortisone  0.1 mg Oral Daily  . heparin  5,000 Units Subcutaneous Q8H  . mometasone-formoterol  2 puff Inhalation BID  . pantoprazole  40 mg Oral Daily  . simvastatin  20 mg Oral q1800  . sodium chloride flush  3 mL Intravenous Q12H   Continuous Infusions: . azithromycin Stopped (11/12/16 2300)  . cefTRIAXone (ROCEPHIN)  IV 2 g (11/12/16 1746)     LOS: 2 days    Time spent: 30 minutes    [name], MD [facility] Pager [phone]  If 7PM-7AM, please contact night-coverage [email or web address] Password [facility] 11/13/2016, 10:51 AM

## 2016-11-13 NOTE — Progress Notes (Signed)
Humidity added to O2 for pt comfort per pt request

## 2016-11-14 LAB — CBC
HEMATOCRIT: 36.4 % — AB (ref 39.0–52.0)
Hemoglobin: 12 g/dL — ABNORMAL LOW (ref 13.0–17.0)
MCH: 30.8 pg (ref 26.0–34.0)
MCHC: 33 g/dL (ref 30.0–36.0)
MCV: 93.3 fL (ref 78.0–100.0)
PLATELETS: 216 10*3/uL (ref 150–400)
RBC: 3.9 MIL/uL — AB (ref 4.22–5.81)
RDW: 13.5 % (ref 11.5–15.5)
WBC: 5.6 10*3/uL (ref 4.0–10.5)

## 2016-11-14 LAB — BASIC METABOLIC PANEL
ANION GAP: 8 (ref 5–15)
BUN: 13 mg/dL (ref 6–20)
CO2: 31 mmol/L (ref 22–32)
Calcium: 8.7 mg/dL — ABNORMAL LOW (ref 8.9–10.3)
Chloride: 103 mmol/L (ref 101–111)
Creatinine, Ser: 0.75 mg/dL (ref 0.61–1.24)
GLUCOSE: 105 mg/dL — AB (ref 65–99)
POTASSIUM: 4.1 mmol/L (ref 3.5–5.1)
Sodium: 142 mmol/L (ref 135–145)

## 2016-11-14 MED ORDER — BENZONATATE 100 MG PO CAPS: 100.0000 mg | ORAL_CAPSULE | Freq: Three times a day (TID) | ORAL | 0 refills | Status: DC | PRN

## 2016-11-14 MED ORDER — CEFUROXIME AXETIL 500 MG PO TABS: 500.0000 mg | ORAL_TABLET | Freq: Two times a day (BID) | ORAL | 0 refills | Status: AC

## 2016-11-14 NOTE — Care Management Important Message (Signed)
Important Message  Patient Details  Name: Fabio NeighborsRoy L Pyle MRN: [id number] Date of Birth: 01/01/1951   Medicare Important Message Given:  Yes    Malcolm MetroChildress, Keyaira Clapham Demske, RN 11/14/2016, 10:56 AM

## 2016-11-14 NOTE — Progress Notes (Signed)
Written instructions and medication list reviewed and given to pt.  2 written prescriptions also given to pt.

## 2016-11-14 NOTE — Care Management Note (Signed)
Case Management Note  Patient Details  Name: [name] MRN: [id number] Date of Birth: [birth year]  Subjective/Objective:                  Pt from home, lives with wife and ind with ADL's. He has PCP, drives himself to appointments and has insurance with drug coverage. He ha sno HH or DME needs pta other than his home oxygen and neb machine already in place pta. Pt communicates no DC needs. Has port O2 at bedside for DC home.   Action/Plan: Discharging home today with self care.   Expected Discharge Date:  11/14/16               Expected Discharge Plan:  Home/Self Care  In-House Referral:  NA  Discharge planning Services  CM Consult  Post Acute Care Choice:  NA Choice offered to:  NA  Status of Service:  Completed, signed off  Malcolm MetroChildress, Tallie Hevia Demske, RN 11/14/2016, 10:57 AM

## 2016-11-14 NOTE — Discharge Summary (Signed)
Physician Discharge Summary  Edward NeighborsRoy L [name] ZOX:[id number] DOB: 05-Jul-1950 DOA: 11/11/2016  PCP: [name], [name]  Admit date: 11/11/2016 Discharge date: 11/14/2016  Time spent: Greater than 30 minutes  Recommendations for Outpatient Follow-up:  1. Recommend follow-up chest CT in a few months to evaluate the masslike area seen at the right lateral aspect of the distal trachea.     Discharge Diagnoses:  Principal Problem:   CAP (community acquired pneumonia) Active Problems:   COPD exacerbation (HCC)   Acute on chronic respiratory failure with hypoxia (HCC)   Cough with hemoptysis   Dyslipidemia   Chronic pain syndrome   Chronic bilateral low back pain without sciatica (R>L)   Long term current use of opiate analgesic   Discharge Condition: Improved  Diet recommendation: Heart healthy  Filed Weights   11/11/16 1916 11/11/16 2326  Weight: 79.8 kg (176 lb) 80.3 kg (177 lb 0.5 oz)    History of present illness:  Patientis a 65 y.o.malewith hx of COPD on home oxygen, HTN, HLD, chronic pain syndrome, opoid dependence, who presented to the ED with increased SOB, DOE, and hypoxia. Evaluation in the ER showed marked leukocytosis of 23K (suspect from a questionable steroid injection given in [name]'s office vs. from infection), and negative lactic acid. His chest CT revealed no PE, but extensive PNA on the right side, along with either mass or dense consolidation. He had a negative UA, negative troponin, and normal renal fx tests. He was started on IV rocephin and Zithromax, and the hospitalist was asked to admit him for CAP and COPD exacerbation with hypoxic respiratory failure.   Hospital Course:  1. Community-acquired pneumonia. Patient was started on supportive treatment, IV azithromycin, and IV Rocephin. He was continued on oxygen. -His white blood cell count which was 23.4 and admission decreased progressively to 5.6 at the time of discharge. -He improved clinically and  symptomatically. He was discharged on 6 more days of Ceftin.  1.5 cm mass versus masslike retained secretions. Patient's chest CT revealed an endoluminal 1.5 cm mass versus masslike retained secretions at the right lateral aspect of the distal trachea at the origin of the right mainstem bronchus. -Patient had no complaints of worsening difficulty breathing or swallowing during the hospitalization. -The significance of this finding was unknown. However, would recommend a follow-up chest CT in 2-3 months for interval evaluation.  COPD with mild exacerbation. Patient is treated with albuterol inhaler/nebulizer, oxygen, and Symbicort at home. -He was restarted on scheduled albuterol nebulizer, oxygen, and substituted Dulera inhaler for Symbicort. -There was not a significant wheezing component, so systemic steroids were not given. Of note, the patient may have received an IM injection of Solu-Medrol vs. IM injection of Rocephin in his PCPs office. -He improved clinically and symptomatically without steroids.  Acute on chronic respiratory failure secondary to CAP and COPD. -Patient is treated with 4 L nasal cannula oxygen continuously at home. It was continued. -Management/treatment was given as above. -Patient's oxygen saturations was maintained in the 95-100% range.  Hemoptysis. Patient reported blood-tinged sputum prior to the hospitalization. This was likely from pneumonia. -It resolved completely.  Essential hypertension. Patient is treated chronically with amlodipine. It was held on admission due to soft blood pressures. -It was restarted when his blood pressure trended up.  Chronic pain syndrome secondary to chronic low back pain/on chronic opiate analgesics. -Patient is treated chronically with Percocet as needed. It was continued.     Procedures:  None   Consultations:  None  Discharge  Exam: Vitals:   11/13/16 2217 11/14/16 0606  BP: 128/67 (!) 143/90  Pulse: 64  66  Resp: 16 16  Temp: 97.8 F (36.6 C) 98.4 F (36.9 C)   General exam: Appears calm and comfortable  Respiratory system: Coarse breath sounds with a few crackles on the right. Breathing nonlabored. Cardiovascular system: S1 & S2 heard, RRR. No JVD, murmurs, rubs, gallops or clicks. No pedal edema. Gastrointestinal system: Abdomen is nondistended, soft and nontender. No organomegaly or masses felt. Normal bowel sounds heard. Central nervous system: Alert and oriented. No focal neurological deficits.    Discharge Instructions   Discharge Instructions    Diet - low sodium heart healthy    Complete by:  As directed    Increase activity slowly    Complete by:  As directed      Current Discharge Medication List    START taking these medications   Details  benzonatate (TESSALON) 100 MG capsule Take 1 capsule (100 mg total) by mouth 3 (three) times daily as needed for cough. Qty: 20 capsule, Refills: 0    cefUROXime (CEFTIN) 500 MG tablet Take 1 tablet (500 mg total) by mouth 2 (two) times daily. Antibiotic to take until completed. Qty: 12 tablet, Refills: 0      CONTINUE these medications which have NOT CHANGED   Details  albuterol (PROVENTIL HFA;VENTOLIN HFA) 108 (90 BASE) MCG/ACT inhaler Inhale 2 puffs into the lungs every 6 (six) hours as needed for wheezing or shortness of breath.    albuterol (PROVENTIL) (2.5 MG/3ML) 0.083% nebulizer solution Take 2.5 mg by nebulization every 4 (four) hours as needed for wheezing or shortness of breath.    amLODipine (NORVASC) 5 MG tablet Take 5 mg by mouth daily.     aspirin EC 81 MG tablet Take 162 mg by mouth daily.    diazepam (VALIUM) 10 MG tablet Take 10 mg by mouth 3 (three) times daily. Reported on 07/15/2015    DULoxetine (CYMBALTA) 20 MG capsule Take 20 mg by mouth at bedtime.     lactulose (CHRONULAC) 10 GM/15ML solution 10 g 4 (four) times daily.     Multiple Vitamin (MULTIVITAMIN WITH MINERALS) TABS tablet Take 1 tablet  by mouth daily.    omeprazole (PRILOSEC) 40 MG capsule Take 40 mg by mouth daily.     oxyCODONE-acetaminophen (PERCOCET) 7.5-325 MG per tablet Take 1 tablet by mouth every 4 (four) hours as needed for pain.    OXYGEN Inhale 4 L into the lungs continuous.    Propylene Glycol (SYSTANE BALANCE) 0.6 % SOLN Place 1 drop into both eyes 2 (two) times daily as needed (dry eyes).    simvastatin (ZOCOR) 20 MG tablet TAKE 1 TABLET BY MOUTH DAILY AT 6 PM. Qty: 30 tablet, Refills: 0    SYMBICORT 160-4.5 MCG/ACT inhaler Inhale 2 puffs into the lungs 2 (two) times daily.    tiZANidine (ZANAFLEX) 4 MG tablet Take 4 mg by mouth daily as needed.    fludrocortisone (FLORINEF) 0.1 MG tablet Take 0.1 mg by mouth daily.        Allergies  Allergen Reactions  . Ativan [Lorazepam] Other (See Comments)    Dry mouth sinus and breathing problems       The results of significant diagnostics from this hospitalization (including imaging, microbiology, ancillary and laboratory) are listed below for reference.    Significant Diagnostic Studies: Ct Angio Chest Pe W/cm &/or Wo Cm  Result Date: 11/11/2016 CLINICAL DATA:  Hemoptysis and dyspnea since  midnight yesterday EXAM: CT ANGIOGRAPHY CHEST WITH CONTRAST TECHNIQUE: Multidetector CT imaging of the chest was performed using the standard protocol during bolus administration of intravenous contrast. Multiplanar CT image reconstructions and MIPs were obtained to evaluate the vascular anatomy. CONTRAST:  100 mL Isovue 370 intravenous COMPARISON:  03/23/2015 CT, 11/11/2016 radiographs. FINDINGS: Cardiovascular: Satisfactory opacification of the pulmonary arteries to the segmental level. No evidence of pulmonary embolism. Normal heart size. No pericardial effusion. Mediastinum/Nodes: Nonspecific mediastinal nodes, with several 10-11 mm short axis nodes in the AP window, a 1.4 cm node in the right peritracheal region and a 1.3 cm node in the subcarinal region. These are  enlarged from 03/23/2015. Lungs/Pleura: 1.5 cm endobronchial mass versus retained secretions at the right lateral aspect of the distal trachea, at the origin of the right mainstem bronchus. This is seen to best advantage on coronal image 78 series 7 but is also visible on axial image 37 series 6. Diffuse interstitial and alveolar opacities throughout the right lung. This could represent pneumonia. Neoplasm less likely but not entirely excluded. Upper Abdomen: No acute findings. Musculoskeletal: No significant skeletal lesion. Review of the MIP images confirms the above findings. IMPRESSION: 1. Negative for acute pulmonary embolism 2. Endoluminal 1.5 cm mass versus masslike retained secretions, at the right lateral aspect of the distal trachea at the origin of the right mainstem bronchus. 3. Extensive opacities throughout the right lung. Left lung is clear. Electronically Signed   By: Ellery Plunk M.D.   On: 11/11/2016 21:28   Dg Chest Port 1 View  Result Date: 11/11/2016 CLINICAL DATA:  Coughing up blood EXAM: PORTABLE CHEST 1 VIEW COMPARISON:  01/08/2016 FINDINGS: Asymmetric ground-glass density in the right thorax with more focal right apical opacity. No pleural effusion. Left lung is clear. Heart size is normal. No pneumothorax. IMPRESSION: Asymmetric ground-glass density throughout the right thorax, could reflect diffuse pneumonia. Electronically Signed   By: Jasmine Pang M.D.   On: 11/11/2016 19:49    Microbiology: No results found for this or any previous visit (from the past 240 hour(s)).   Labs: Basic Metabolic Panel:  Recent Labs Lab 11/11/16 1948 11/12/16 0439 11/14/16 0450  NA 136 137 142  K 4.0 3.6 4.1  CL 100* 101 103  CO2 28 27 31   GLUCOSE 100* 98 105*  BUN 19 14 13   CREATININE 0.99 0.78 0.75  CALCIUM 8.6* 8.5* 8.7*   Liver Function Tests:  Recent Labs Lab 11/12/16 0439  AST 15  ALT 15*  ALKPHOS 60  BILITOT 1.1  PROT 6.0*  ALBUMIN 3.4*   No results for  input(s): LIPASE, AMYLASE in the last 168 hours. No results for input(s): AMMONIA in the last 168 hours. CBC:  Recent Labs Lab 11/11/16 1948 11/12/16 0439 11/14/16 0450  WBC 23.4* 19.2* 5.6  NEUTROABS 20.5*  --   --   HGB 14.4 13.4 12.0*  HCT 42.6 40.1 36.4*  MCV 92.6 92.6 93.3  PLT 209 197 216   Cardiac Enzymes:  Recent Labs Lab 11/11/16 1948  TROPONINI <0.03   BNP: BNP (last 3 results)  Recent Labs  03/26/16 1227  BNP 33.0    ProBNP (last 3 results) No results for input(s): PROBNP in the last 8760 hours.  CBG: No results for input(s): GLUCAP in the last 168 hours.     Signed:  Isidore Margraf [name].  [facility] 11/14/2016, 10:50 AM

## 2016-11-19 DIAGNOSIS — R918 Other nonspecific abnormal finding of lung field: Secondary | ICD-10-CM | POA: Diagnosis not present

## 2016-11-19 DIAGNOSIS — Z6825 Body mass index (BMI) 25.0-25.9, adult: Secondary | ICD-10-CM | POA: Diagnosis not present

## 2016-11-19 DIAGNOSIS — J189 Pneumonia, unspecified organism: Secondary | ICD-10-CM | POA: Diagnosis not present

## 2016-11-19 DIAGNOSIS — J449 Chronic obstructive pulmonary disease, unspecified: Secondary | ICD-10-CM | POA: Diagnosis not present

## 2016-11-19 DIAGNOSIS — E663 Overweight: Secondary | ICD-10-CM | POA: Diagnosis not present

## 2016-11-19 DIAGNOSIS — J9620 Acute and chronic respiratory failure, unspecified whether with hypoxia or hypercapnia: Secondary | ICD-10-CM | POA: Diagnosis not present

## 2016-11-19 DIAGNOSIS — J441 Chronic obstructive pulmonary disease with (acute) exacerbation: Secondary | ICD-10-CM | POA: Diagnosis not present

## 2016-12-06 DIAGNOSIS — J441 Chronic obstructive pulmonary disease with (acute) exacerbation: Secondary | ICD-10-CM | POA: Diagnosis not present

## 2016-12-29 DIAGNOSIS — G894 Chronic pain syndrome: Secondary | ICD-10-CM | POA: Diagnosis not present

## 2016-12-29 DIAGNOSIS — T148XXA Other injury of unspecified body region, initial encounter: Secondary | ICD-10-CM | POA: Diagnosis not present

## 2016-12-29 DIAGNOSIS — Z23 Encounter for immunization: Secondary | ICD-10-CM | POA: Diagnosis not present

## 2016-12-29 DIAGNOSIS — M1991 Primary osteoarthritis, unspecified site: Secondary | ICD-10-CM | POA: Diagnosis not present

## 2017-01-04 ENCOUNTER — Encounter (HOSPITAL_COMMUNITY): Payer: Self-pay

## 2017-01-04 ENCOUNTER — Emergency Department (HOSPITAL_COMMUNITY)
Admission: EM | Admit: 2017-01-04 | Discharge: 2017-01-04 | Disposition: A | Discharge status: Home / Self Care | Payer: Medicare Other | Attending: Emergency Medicine | Admitting: Emergency Medicine

## 2017-01-04 DIAGNOSIS — N3000 Acute cystitis without hematuria: Secondary | ICD-10-CM | POA: Insufficient documentation

## 2017-01-04 DIAGNOSIS — Z87891 Personal history of nicotine dependence: Secondary | ICD-10-CM | POA: Diagnosis not present

## 2017-01-04 DIAGNOSIS — I119 Hypertensive heart disease without heart failure: Secondary | ICD-10-CM | POA: Insufficient documentation

## 2017-01-04 DIAGNOSIS — I249 Acute ischemic heart disease, unspecified: Secondary | ICD-10-CM | POA: Diagnosis not present

## 2017-01-04 DIAGNOSIS — B9689 Other specified bacterial agents as the cause of diseases classified elsewhere: Secondary | ICD-10-CM | POA: Diagnosis not present

## 2017-01-04 DIAGNOSIS — J449 Chronic obstructive pulmonary disease, unspecified: Secondary | ICD-10-CM | POA: Insufficient documentation

## 2017-01-04 DIAGNOSIS — R300 Dysuria: Secondary | ICD-10-CM | POA: Diagnosis present

## 2017-01-04 LAB — URINALYSIS, ROUTINE W REFLEX MICROSCOPIC
BILIRUBIN URINE: NEGATIVE
GLUCOSE, UA: NEGATIVE mg/dL
HGB URINE DIPSTICK: NEGATIVE
KETONES UR: NEGATIVE mg/dL
Nitrite: NEGATIVE
PH: 5 (ref 5.0–8.0)
PROTEIN: NEGATIVE mg/dL
Specific Gravity, Urine: 1.009 (ref 1.005–1.030)

## 2017-01-04 MED ORDER — CEPHALEXIN 500 MG PO CAPS: 500.0000 mg | ORAL_CAPSULE | Freq: Two times a day (BID) | ORAL | 0 refills | Status: AC

## 2017-01-04 NOTE — ED Provider Notes (Signed)
Emergency Department Provider Note   I have reviewed the triage vital signs and the nursing notes.   HISTORY  Chief Complaint Dysuria   HPI [name] is a 66 y.o. male with PMH of CAD, COPD, HLD, HTN presents to the emergency department for evaluation of dysuria and urinary frequency that started yesterday.He denies any nausea, vomiting, diarrhea. No lower abdominal pain. Denies back pain or fevers. Denies urethral discharge. No hematuria. No flank pain. Patient has no concern for STD exposure. No radiation of symptoms. No modifying factors.    Past Medical History:  Diagnosis Date  . Anxiety   . CAD (coronary artery disease)   . Chronic pain   . 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 Dyslipidemia   . History of tobacco abuse    80 pack year history   . Hypertension   . On home O2    4L N/C  . Opioid dependence (HCC)   . Respiratory failure [facility])     Patient Active Problem List   Diagnosis Date Noted  . Cough with hemoptysis 11/13/2016  . 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 Vitamin D insufficiency 04/01/2016  . Elevated C-reactive protein (CRP) 03/28/2016  . Chronic pain syndrome 03/26/2016  . Chronic bilateral low back pain without sciatica (R>L) 03/26/2016  . Lynnzie Blackson term current use of opiate analgesic 03/26/2016  . Cross [place] term prescription opiate use 03/26/2016  . Encounter for therapeutic drug level monitoring 03/26/2016  . Encounter for pain management planning 03/26/2016  . Chronic sacroiliac joint pain (B) (R>L) 03/26/2016  . Chronic hip pain, left 03/26/2016  . Chronic right hip pain 03/26/2016  . Chronic hip pain (B) (L>R) 03/26/2016  . Chronic left sacroiliac pain 03/26/2016  . Chronic right sacroiliac pain 03/26/2016  . HCAP (healthcare-associated pneumonia) 01/08/2016  . PNA (pneumonia) 12/25/2015  . Chronic respiratory failure with hypoxia (HCC) 12/25/2015  . COPD exacerbation (HCC)   . Sepsis (HCC) 07/03/2015  . CAP  (community acquired pneumonia) 07/03/2015  . Acute on chronic respiratory failure with hypoxia (HCC) 07/03/2015  . GERD (gastroesophageal reflux disease) 07/03/2015  . Coronary artery disease 10/13/2013  . Anxiety 10/13/2013  . Dyslipidemia 06/23/2013  . COPD (chronic obstructive pulmonary disease) (HCC) 06/23/2013  . SHOULDER PAIN 08/16/2008  . SHOULDER IMPINGEMENT SYNDROME, LEFT 08/16/2008    Past Surgical History:  Procedure Laterality Date  . CARDIAC CATHETERIZATION  08/10/2008   right & left - normal coronaries (Dr. Claudia Desanctis)  . NM MYOCAR PERF WALL MOTION  07/2008   bruce myoview - mild ischemia in basal inferoseptal & mid inferoseptal regions; EF 63%  . TRANSTHORACIC ECHOCARDIOGRAM  07/2008   EF=>55%; RV mildly dilated; RA mild-mod dilated; mild mitral annular calcification & mild MR; AV mildly sclerotic    Current Outpatient Rx  . Order #: [id number] Class: Historical Med  . Order #: [id number] Class: Historical Med  . Order #: [id number] Class: Historical Med  . Order #: [id number] Class: Historical Med  . Order #: [id number] Class: Print  . Order #: [id number] Class: Historical Med  . Order #: [id number] Class: Historical Med  . Order #: [id number] Class: Historical Med  . Order #: [id number] Class: Historical Med  . Order #: [id number] Class: Historical Med  . Order #: [id number] Class: Historical Med  . Order #: [id number] Class: Historical Med  . Order #: [id number] Class: Historical Med  . Order #: [id number] Class: Historical Med  . Order #: [id number] Class: Normal  . Order #: [id number] Class: Historical Med  . Order #: [id number] Class: Print  Allergies Ativan [lorazepam]  Family History  Problem Relation Age of Onset  . Leukemia Mother   . Suicidality Father     Social History Social History  Substance Use Topics  . Smoking status: Former Smoker    Start date: 04/21/1974    Quit date: 06/22/2007  . Smokeless tobacco: Never Used  . Alcohol use No    Review of  Systems  Constitutional: No fever/chills Eyes: No visual changes. ENT: No sore throat. Cardiovascular: Denies chest pain. Respiratory: Denies shortness of breath. Gastrointestinal: No abdominal pain.  No nausea, no vomiting.  No diarrhea.  No constipation. Genitourinary: Positive for dysuria. Musculoskeletal: Negative for back pain. Skin: Negative for rash. Neurological: Negative for headaches, focal weakness or numbness.  10-point ROS otherwise negative.  ____________________________________________   PHYSICAL EXAM:  VITAL SIGNS: ED Triage Vitals  Enc Vitals Group     BP 01/04/17 1022 138/90     Pulse Rate 01/04/17 1022 99     Resp 01/04/17 1022 20     Temp 01/04/17 1022 97.7 F (36.5 C)     Temp Source 01/04/17 1022 Oral     SpO2 01/04/17 1022 100 %     Weight 01/04/17 1022 176 lb (79.8 kg)     Height 01/04/17 1022  (1.753 m)     Pain Score 01/04/17 1021 0   Constitutional: Alert and oriented. Well appearing and in no acute distress. Eyes: Conjunctivae are normal.  Head: Atraumatic. Nose: No congestion/rhinnorhea. Mouth/Throat: Mucous membranes are moist.  Neck: No stridor.  Cardiovascular: Normal rate, regular rhythm. Good peripheral circulation. Grossly normal heart sounds.   Respiratory: Normal respiratory effort.  No retractions. Lungs CTAB. Gastrointestinal: Soft and nontender. No distention.  Musculoskeletal: No lower extremity tenderness nor edema. No gross deformities of extremities. Neurologic:  Normal speech and language. No gross focal neurologic deficits are appreciated.  Skin:  Skin is warm, dry and intact. No rash noted.  ____________________________________________   LABS (all labs ordered are listed, but only abnormal results are displayed)  Labs Reviewed  URINALYSIS, ROUTINE W REFLEX MICROSCOPIC - Abnormal; Notable for the following:       Result Value   APPearance HAZY (*)    Leukocytes, UA SMALL (*)    Bacteria, UA RARE (*)     Squamous Epithelial / LPF 0-5 (*)    All other components within normal limits  URINE CULTURE   ____________________________________________  RADIOLOGY  None  ____________________________________________   PROCEDURES  Procedure(s) performed:   Procedures  None ____________________________________________   INITIAL IMPRESSION / ASSESSMENT AND PLAN / ED COURSE  Pertinent labs & imaging results that were available during my care of the patient were reviewed by me and considered in my medical decision making (see chart for details).  Patient presents to the emergency department for evaluation of dysuria and urinary frequency starting yesterday. Symptoms most consistent with urinary tract infection. No concern for STD exposure per patient. Plan for UA and culture. Abdomen is soft and nontender. No indication for imaging or lab work at this time.  UTI on UA. Treating with Keflex. No evidence of urosepsis or pyelonephritis.   At this time, I do not feel there is any life-threatening condition present. I have reviewed and discussed all results (EKG, imaging, lab, urine as appropriate), exam findings with patient. I have reviewed nursing notes and appropriate previous records.  I feel the patient is safe to be discharged home without further emergent workup. Discussed usual and customary return precautions. Patient  and family (if present) verbalize understanding and are comfortable with this plan.  Patient will follow-up with their primary care provider. If they do not have a primary care provider, information for follow-up has been provided to them. All questions have been answered.  ____________________________________________  FINAL CLINICAL IMPRESSION(S) / ED DIAGNOSES  Final diagnoses:  Acute cystitis without hematuria     MEDICATIONS GIVEN DURING THIS VISIT:  Medications - No data to display   NEW OUTPATIENT MEDICATIONS STARTED DURING THIS VISIT:  Discharge Medication  List as of 01/04/2017 11:23 AM    START taking these medications   Details  cephALEXin (KEFLEX) 500 MG capsule Take 1 capsule (500 mg total) by mouth 2 (two) times daily., Starting Sun 01/04/2017, Until Sun 01/11/2017, Print        Note:  This document was prepared using Dragon voice recognition software and may include unintentional dictation errors.  Alona Bene, MD Emergency Medicine    Nykia Turko, Arlyss Repress, MD 01/04/17 2011

## 2017-01-04 NOTE — Discharge Instructions (Signed)

## 2017-01-04 NOTE — ED Triage Notes (Signed)
Reports of dysuria and urinary frequency since yesterday. Denies N/V/D.

## 2017-01-06 ENCOUNTER — Emergency Department (HOSPITAL_COMMUNITY)
Admission: EM | Admit: 2017-01-06 | Discharge: 2017-01-07 | Disposition: A | Discharge status: Home / Self Care | Payer: Medicare Other | Attending: Emergency Medicine | Admitting: Emergency Medicine

## 2017-01-06 ENCOUNTER — Encounter (HOSPITAL_COMMUNITY): Payer: Self-pay | Admitting: Cardiology

## 2017-01-06 ENCOUNTER — Emergency Department (HOSPITAL_COMMUNITY): Payer: Medicare Other

## 2017-01-06 DIAGNOSIS — I2510 Atherosclerotic heart disease of native coronary artery without angina pectoris: Secondary | ICD-10-CM | POA: Insufficient documentation

## 2017-01-06 DIAGNOSIS — J449 Chronic obstructive pulmonary disease, unspecified: Secondary | ICD-10-CM | POA: Diagnosis not present

## 2017-01-06 DIAGNOSIS — J441 Chronic obstructive pulmonary disease with (acute) exacerbation: Secondary | ICD-10-CM | POA: Diagnosis not present

## 2017-01-06 DIAGNOSIS — N400 Enlarged prostate without lower urinary tract symptoms: Secondary | ICD-10-CM | POA: Diagnosis not present

## 2017-01-06 DIAGNOSIS — Z7982 Long term (current) use of aspirin: Secondary | ICD-10-CM | POA: Diagnosis not present

## 2017-01-06 DIAGNOSIS — R339 Retention of urine, unspecified: Secondary | ICD-10-CM | POA: Diagnosis not present

## 2017-01-06 DIAGNOSIS — Z87891 Personal history of nicotine dependence: Secondary | ICD-10-CM | POA: Insufficient documentation

## 2017-01-06 DIAGNOSIS — R1084 Generalized abdominal pain: Secondary | ICD-10-CM | POA: Diagnosis present

## 2017-01-06 DIAGNOSIS — E876 Hypokalemia: Secondary | ICD-10-CM | POA: Diagnosis not present

## 2017-01-06 DIAGNOSIS — I10 Essential (primary) hypertension: Secondary | ICD-10-CM | POA: Insufficient documentation

## 2017-01-06 DIAGNOSIS — Z79899 Other long term (current) drug therapy: Secondary | ICD-10-CM | POA: Diagnosis not present

## 2017-01-06 LAB — CBC
HEMATOCRIT: 38.4 % — AB (ref 39.0–52.0)
HEMOGLOBIN: 13.5 g/dL (ref 13.0–17.0)
MCH: 31.3 pg (ref 26.0–34.0)
MCHC: 35.2 g/dL (ref 30.0–36.0)
MCV: 89.1 fL (ref 78.0–100.0)
Platelets: 378 10*3/uL (ref 150–400)
RBC: 4.31 MIL/uL (ref 4.22–5.81)
RDW: 12.7 % (ref 11.5–15.5)
WBC: 17.1 10*3/uL — AB (ref 4.0–10.5)

## 2017-01-06 LAB — COMPREHENSIVE METABOLIC PANEL
ALT: 19 U/L (ref 17–63)
ANION GAP: 9 (ref 5–15)
AST: 19 U/L (ref 15–41)
Albumin: 4 g/dL (ref 3.5–5.0)
Alkaline Phosphatase: 87 U/L (ref 38–126)
BILIRUBIN TOTAL: 0.6 mg/dL (ref 0.3–1.2)
BUN: 7 mg/dL (ref 6–20)
CHLORIDE: 93 mmol/L — AB (ref 101–111)
CO2: 28 mmol/L (ref 22–32)
Calcium: 9.2 mg/dL (ref 8.9–10.3)
Creatinine, Ser: 0.74 mg/dL (ref 0.61–1.24)
Glucose, Bld: 114 mg/dL — ABNORMAL HIGH (ref 65–99)
POTASSIUM: 3.3 mmol/L — AB (ref 3.5–5.1)
Sodium: 130 mmol/L — ABNORMAL LOW (ref 135–145)
TOTAL PROTEIN: 7.4 g/dL (ref 6.5–8.1)

## 2017-01-06 LAB — URINALYSIS, ROUTINE W REFLEX MICROSCOPIC
BACTERIA UA: NONE SEEN
BILIRUBIN URINE: NEGATIVE
GLUCOSE, UA: NEGATIVE mg/dL
KETONES UR: NEGATIVE mg/dL
LEUKOCYTES UA: NEGATIVE
NITRITE: NEGATIVE
PH: 5 (ref 5.0–8.0)
PROTEIN: NEGATIVE mg/dL
SQUAMOUS EPITHELIAL / LPF: NONE SEEN
Specific Gravity, Urine: 1.008 (ref 1.005–1.030)

## 2017-01-06 LAB — LIPASE, BLOOD: Lipase: 26 U/L (ref 11–51)

## 2017-01-06 MED ORDER — POTASSIUM CHLORIDE CRYS ER 20 MEQ PO TBCR
40.0000 meq | EXTENDED_RELEASE_TABLET | Freq: Once | ORAL | Status: AC
  Administered 2017-01-06: 40 meq via ORAL
  Filled 2017-01-06: qty 2

## 2017-01-06 MED ORDER — POTASSIUM CHLORIDE 10 MEQ/100ML IV SOLN
10.0000 meq | Freq: Once | INTRAVENOUS | Status: AC
  Administered 2017-01-06: 10 meq via INTRAVENOUS
  Filled 2017-01-06: qty 100

## 2017-01-06 MED ORDER — IOPAMIDOL (ISOVUE-300) INJECTION 61%
100.0000 mL | Freq: Once | INTRAVENOUS | Status: AC | PRN
  Administered 2017-01-06: 100 mL via INTRAVENOUS

## 2017-01-06 NOTE — ED Triage Notes (Signed)
Lower abdominal pain since last night.  Vomited times one.  Seen here Sunday with dysuria and diagnosed with a kidney infection.   States he is urinating ok now.

## 2017-01-06 NOTE — ED Provider Notes (Addendum)
[facility]-EMERGENCY DEPT Provider Note   CSN: [id number] Arrival date & time: 01/06/17  1832     History   Chief Complaint Chief Complaint  Patient presents with  . Abdominal Pain    HPI [name] is a 66 y.o. male.  HPI Complains of diffuse abdominal pain onsetapproximately 24 hours ago. Patient had one episode of vomiting earlier today. He felt constipated earlier today. He treated himself with lactulose with a good bowel movement. He denies nausea at present. Denies chest pain. Denies fever denies shortness of breath. Denies urinary symptoms other than chronic urinary incontinenceDenies other associated symptoms.nothing makes symptoms better or worse.patient was seen here on 01/04/2017 diagnosed with urinary tract infection. Treated with Keflex. Culture pending Past Medical History:  Diagnosis Date  . Anxiety   . CAD (coronary artery disease)   . Chronic pain   . 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 Dyslipidemia   . History of tobacco abuse    80 pack year history   . Hypertension   . On home O2    4L N/C  . Opioid dependence (HCC)   . Respiratory failure [facility])     Patient Active Problem List   Diagnosis Date Noted  . Cough with hemoptysis 11/13/2016  . 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 Vitamin D insufficiency 04/01/2016  . Elevated C-reactive protein (CRP) 03/28/2016  . Chronic pain syndrome 03/26/2016  . Chronic bilateral low back pain without sciatica (R>L) 03/26/2016  . Long term current use of opiate analgesic 03/26/2016  . Long term prescription opiate use 03/26/2016  . Encounter for therapeutic drug level monitoring 03/26/2016  . Encounter for pain management planning 03/26/2016  . Chronic sacroiliac joint pain (B) (R>L) 03/26/2016  . Chronic hip pain, left 03/26/2016  . Chronic right hip pain 03/26/2016  . Chronic hip pain (B) (L>R) 03/26/2016  . Chronic left sacroiliac pain 03/26/2016  . Chronic right sacroiliac pain 03/26/2016    . HCAP (healthcare-associated pneumonia) 01/08/2016  . PNA (pneumonia) 12/25/2015  . Chronic respiratory failure with hypoxia (HCC) 12/25/2015  . COPD exacerbation (HCC)   . Sepsis (HCC) 07/03/2015  . CAP (community acquired pneumonia) 07/03/2015  . Acute on chronic respiratory failure with hypoxia (HCC) 07/03/2015  . GERD (gastroesophageal reflux disease) 07/03/2015  . Coronary artery disease 10/13/2013  . Anxiety 10/13/2013  . Dyslipidemia 06/23/2013  . COPD (chronic obstructive pulmonary disease) (HCC) 06/23/2013  . SHOULDER PAIN 08/16/2008  . SHOULDER IMPINGEMENT SYNDROME, LEFT 08/16/2008    Past Surgical History:  Procedure Laterality Date  . CARDIAC CATHETERIZATION  08/10/2008   right & left - normal coronaries (Dr. Claudia Desanctis)  . NM MYOCAR PERF WALL MOTION  07/2008   bruce myoview - mild ischemia in basal inferoseptal & mid inferoseptal regions; EF 63%  . TRANSTHORACIC ECHOCARDIOGRAM  07/2008   EF=>55%; RV mildly dilated; RA mild-mod dilated; mild mitral annular calcification & mild MR; AV mildly sclerotic       Home Medications    Prior to Admission medications   Medication Sig Start Date End Date Taking? Authorizing Provider  albuterol (PROVENTIL HFA;VENTOLIN HFA) 108 (90 BASE) MCG/ACT inhaler Inhale 2 puffs into the lungs every 6 (six) hours as needed for wheezing or shortness of breath.    [provider]  albuterol (PROVENTIL) (2.5 MG/3ML) 0.083% nebulizer solution Take 2.5 mg by nebulization every 4 (four) hours as needed for wheezing or shortness of breath.    [provider]  amLODipine (NORVASC)  5 MG tablet Take 5 mg by mouth daily.  10/14/16   [provider]  aspirin EC 81 MG tablet Take 162 mg by mouth daily.    [provider]  benzonatate (TESSALON) 100 MG capsule Take 1 capsule (100 mg total) by mouth 3 (three) times daily as needed for cough. 11/14/16   [name], MD  cephALEXin (KEFLEX) 500 MG capsule Take 1 capsule  (500 mg total) by mouth 2 (two) times daily. 01/04/17 01/11/17  Long, Arlyss Repress, MD  diazepam (VALIUM) 10 MG tablet Take 10 mg by mouth 3 (three) times daily. Reported on 07/15/2015    [provider]  DULoxetine (CYMBALTA) 20 MG capsule Take 20 mg by mouth at bedtime.     [provider]  fludrocortisone (FLORINEF) 0.1 MG tablet Take 0.1 mg by mouth daily.  11/01/16   [provider]  lactulose (CHRONULAC) 10 GM/15ML solution 10 g 4 (four) times daily.  02/11/16   [provider]  Multiple Vitamin (MULTIVITAMIN WITH MINERALS) TABS tablet Take 1 tablet by mouth daily.    [provider]  omeprazole (PRILOSEC) 40 MG capsule Take 40 mg by mouth daily.  01/10/13   [provider]  oxyCODONE-acetaminophen (PERCOCET) 7.5-325 MG per tablet Take 1 tablet by mouth every 4 (four) hours as needed for pain.    [provider]  OXYGEN Inhale 4 L into the lungs continuous.    [provider]  Propylene Glycol (SYSTANE BALANCE) 0.6 % SOLN Place 1 drop into both eyes 2 (two) times daily as needed (dry eyes).    [provider]  simvastatin (ZOCOR) 20 MG tablet TAKE 1 TABLET BY MOUTH DAILY AT 6 PM. 09/21/15   Laqueta Linden, MD  SYMBICORT 160-4.5 MCG/ACT inhaler Inhale 2 puffs into the lungs 2 (two) times daily. 12/21/15   [provider]    Family History Family History  Problem Relation Age of Onset  . Leukemia Mother   . Suicidality Father     Social History Social History  Substance Use Topics  . Smoking status: Former Smoker    Start date: 04/21/1974    Quit date: 06/22/2007  . Smokeless tobacco: Never Used  . Alcohol use No     Allergies   Ativan [lorazepam]   Review of Systems Review of Systems  Gastrointestinal: Positive for abdominal pain and vomiting.       No nausea at present  Genitourinary:       Chronic urinary incontinence  Musculoskeletal: Positive for back pain.       Chronic back pain  All  other systems reviewed and are negative.    Physical Exam Updated Vital Signs BP (!) 161/94 (BP Location: Right Arm)   Pulse 88   Temp (!) 97.5 F (36.4 C) (Oral)   Resp 16   Ht  (1.753 m)   Wt 79.8 kg (176 lb)   SpO2 98%   BMI 25.99 kg/m   Physical Exam  Constitutional: He appears well-developed and well-nourished.  Chronically ill-appearing  HENT:  Head: Normocephalic and atraumatic.  Eyes: Pupils are equal, round, and reactive to light. Conjunctivae are normal.  Neck: Neck supple. No tracheal deviation present. No thyromegaly present.  Cardiovascular: Normal rate and regular rhythm.   No murmur heard. Pulmonary/Chest: Effort normal and breath sounds normal.  Abdominal: Soft. Bowel sounds are normal. He exhibits no distension. There is tenderness.  Diffusely tender  Genitourinary: Penis normal.  Musculoskeletal: Normal range of motion.  He exhibits no edema or tenderness.  Neurological: He is alert. Coordination normal.  Skin: Skin is warm and dry. No rash noted.  Psychiatric: He has a normal mood and affect.  Nursing note and vitals reviewed.    ED Treatments / Results  Labs (all labs ordered are listed, but only abnormal results are displayed) Labs Reviewed  COMPREHENSIVE METABOLIC PANEL - Abnormal; Notable for the following:       Result Value   Sodium 130 (*)    Potassium 3.3 (*)    Chloride 93 (*)    Glucose, Bld 114 (*)    All other components within normal limits  CBC - Abnormal; Notable for the following:    WBC 17.1 (*)    HCT 38.4 (*)    All other components within normal limits  LIPASE, BLOOD  URINALYSIS, ROUTINE W REFLEX MICROSCOPIC    EKG  EKG Interpretation None       Radiology No results found.  Procedures Procedures (including critical care time)  Medications Ordered in ED Medications  potassium chloride 10 mEq in 100 mL IVPB (not administered)   Results for orders placed or performed during the hospital encounter of  01/06/17  Lipase, blood  Result Value Ref Range   Lipase 26 11 - 51 U/L  Comprehensive metabolic panel  Result Value Ref Range   Sodium 130 (L) 135 - 145 mmol/L   Potassium 3.3 (L) 3.5 - 5.1 mmol/L   Chloride 93 (L) 101 - 111 mmol/L   CO2 28 22 - 32 mmol/L   Glucose, Bld 114 (H) 65 - 99 mg/dL   BUN 7 6 - 20 mg/dL   Creatinine, Ser 1.61 0.61 - 1.24 mg/dL   Calcium 9.2 8.9 - 09.6 mg/dL   Total Protein 7.4 6.5 - 8.1 g/dL   Albumin 4.0 3.5 - 5.0 g/dL   AST 19 15 - 41 U/L   ALT 19 17 - 63 U/L   Alkaline Phosphatase 87 38 - 126 U/L   Total Bilirubin 0.6 0.3 - 1.2 mg/dL   GFR calc non Af Amer >60 >60 mL/min   GFR calc Af Amer >60 >60 mL/min   Anion gap 9 5 - 15  CBC  Result Value Ref Range   WBC 17.1 (H) 4.0 - 10.5 K/uL   RBC 4.31 4.22 - 5.81 MIL/uL   Hemoglobin 13.5 13.0 - 17.0 g/dL   HCT 04.5 (L) 40.9 - 81.1 %   MCV 89.1 78.0 - 100.0 fL   MCH 31.3 26.0 - 34.0 pg   MCHC 35.2 30.0 - 36.0 g/dL   RDW 91.4 78.2 - 95.6 %   Platelets 378 150 - 400 K/uL  Urinalysis, Routine w reflex microscopic  Result Value Ref Range   Color, Urine YELLOW YELLOW   APPearance CLEAR CLEAR   Specific Gravity, Urine 1.008 1.005 - 1.030   pH 5.0 5.0 - 8.0   Glucose, UA NEGATIVE NEGATIVE mg/dL   Hgb urine dipstick SMALL (A) NEGATIVE   Bilirubin Urine NEGATIVE NEGATIVE   Ketones, ur NEGATIVE NEGATIVE mg/dL   Protein, ur NEGATIVE NEGATIVE mg/dL   Nitrite NEGATIVE NEGATIVE   Leukocytes, UA NEGATIVE NEGATIVE   RBC / HPF 0-5 0 - 5 RBC/hpf   WBC, UA 0-5 0 - 5 WBC/hpf   Bacteria, UA NONE SEEN NONE SEEN   Squamous Epithelial / LPF NONE SEEN NONE SEEN   Ct Abdomen Pelvis W Contrast  Result Date: 01/06/2017 CLINICAL DATA:  Lower abdominal pain since last  night. Vomiting. Seen here on Sunday with dysuria and diagnosed with a kidney infection. Urination is okay now. EXAM: CT ABDOMEN AND PELVIS WITH CONTRAST TECHNIQUE: Multidetector CT imaging of the abdomen and pelvis was performed using the standard protocol  following bolus administration of intravenous contrast. CONTRAST:  ISOVUE-300 IOPAMIDOL (ISOVUE-300) INJECTION 61% COMPARISON:  None. FINDINGS: Lower chest: Nodule in the left lung base laterally measuring 5 mm. Hepatobiliary: No focal liver abnormality is seen. No gallstones, gallbladder wall thickening, or biliary dilatation. Pancreas: Unremarkable. No pancreatic ductal dilatation or surrounding inflammatory changes. Spleen: Normal in size without focal abnormality. Adrenals/Urinary Tract: No adrenal gland nodules. Kidneys are symmetrical. Nephrograms are homogeneous. No hydronephrosis or hydroureter. Bladder is diffusely distended without wall thickening or filling defect. This could be physiologic or may indicate dysmotility or outlet obstruction. Stomach/Bowel: Stomach, small bowel, and colon are not abnormally distended. Stomach and small bowel are decompressed. Scattered gas and stool in the colon. The appendix is not identified. Vascular/Lymphatic: Aortic atherosclerosis. No enlarged abdominal or pelvic lymph nodes. Reproductive: Prostate gland is enlarged, measuring 5.1 x 5.3 cm. Prostate calcifications are present. There is an ovoid cystic appearing structure measuring 1.3 cm maximal diameter demonstrated in the base of the penis. This could represent a benign cyst or possibly urethral diverticulum. Other: There is minimal free fluid in the pericolic gutters anteriorly with slight infiltration. This is nonspecific but could represent inflammatory process. Etiology is indeterminate. No free air in the abdomen. Small periumbilical hernias containing fat. Musculoskeletal: Degenerative changes in the spine. No destructive bone lesions. IMPRESSION: 1. Distended bladder without wall thickening or filling defect. This could be physiologic or may indicate dysmotility or outlet obstruction. 2. Diffuse prostate enlargement. 3. Nonspecific cystic structure at the base of penis, possibly urethral diverticulum.  4. Nonspecific inflammatory infiltration along the pericolic gutters anteriorly. 5. No evidence of bowel obstruction or inflammation. 6. 5 mm left lung nodule. This is unchanged since 03/23/2015. Long-term stability suggests a benign etiology. 7. Aortic atherosclerosis. Electronically Signed   By: Burman Nieves M.D.   On: 01/06/2017 22:56    Initial Impression / Assessment and Plan / ED Course  I have reviewed the triage vital signs and the nursing notes.  Pertinent labs & imaging results that were available during my care of the patient were reviewed by me and considered in my medical decision making (see chart for details).     Foley catheter was inserted after CT scan result. Patient put out approximately 2 L of urine into the Foley bag. I suspect the patient has urinary overflow incontinence and back pain and abdominal pain may be secondary to chronic urinary retention. He feels much improved after treatment with Foley catheter.he received oral and intravenous potassium supplementation while here Plan Foley catheter with leg bag to go. Referral [facility] Final Clinical Impressions(s) / ED Diagnoses  Diagnosis #1 urinary retention #2 hypokalemia Final diagnoses:  None    New Prescriptions New Prescriptions   No medications on file     Doug Sou, MD 01/06/17 2357    Doug Sou, MD 01/07/17 0000

## 2017-01-07 LAB — URINE CULTURE
Culture: >=100000 — AB
SPECIAL REQUESTS: NORMAL

## 2017-01-07 NOTE — ED Notes (Signed)
Pt ambulatory to waiting room. Pt verbalized understanding of discharge instructions.   

## 2017-01-07 NOTE — Discharge Instructions (Signed)
Call [facility] office tomorrow to schedule an appointment for within a week. Tell office staff that you were seen here when scheduling the appointment

## 2017-01-08 ENCOUNTER — Telehealth: Payer: Self-pay | Admitting: *Deleted

## 2017-01-08 DIAGNOSIS — N3289 Other specified disorders of bladder: Secondary | ICD-10-CM | POA: Diagnosis not present

## 2017-01-08 DIAGNOSIS — R109 Unspecified abdominal pain: Secondary | ICD-10-CM | POA: Diagnosis not present

## 2017-01-08 DIAGNOSIS — N390 Urinary tract infection, site not specified: Secondary | ICD-10-CM | POA: Diagnosis not present

## 2017-01-08 DIAGNOSIS — E876 Hypokalemia: Secondary | ICD-10-CM | POA: Diagnosis not present

## 2017-01-08 DIAGNOSIS — Z6824 Body mass index (BMI) 24.0-24.9, adult: Secondary | ICD-10-CM | POA: Diagnosis not present

## 2017-01-08 NOTE — Progress Notes (Signed)
ED Antimicrobial Stewardship Positive Culture Follow Up   [name] is an 66 y.o. male who presented to [facility] on 01/06/2017 with a chief complaint of  Chief Complaint  Patient presents with  . Abdominal Pain    Recent Results (from the past 720 hour(s))  Urine culture     Status: Abnormal   Collection Time: 01/04/17 10:44 AM  Result Value Ref Range Status   Specimen Description URINE, CLEAN CATCH  Final   Special Requests Normal  Final   Culture >=100,000 COLONIES/mL ENTEROCOCCUS FAECALIS (A)  Final   Report Status 01/07/2017 FINAL  Final   Organism ID, Bacteria ENTEROCOCCUS FAECALIS (A)  Final      Susceptibility   Enterococcus faecalis - MIC*    AMPICILLIN <=2 SENSITIVE Sensitive     LEVOFLOXACIN 1 SENSITIVE Sensitive     NITROFURANTOIN <=16 SENSITIVE Sensitive     VANCOMYCIN 1 SENSITIVE Sensitive     * >=100,000 COLONIES/mL ENTEROCOCCUS FAECALIS     Treated with cephalexin, organism resistant to prescribed antimicrobial  Patient discharged originally without antimicrobial agent and treatment is now indicated  New antibiotic prescription: Amoxicillin  PO BID x 7 days Patient should stop cephalexin  ED Provider: Arthor Captain, PA-C   Sallee Provencal 01/08/2017, 7:36 AM Infectious Diseases Pharmacist Phone# [phone]

## 2017-01-08 NOTE — Telephone Encounter (Signed)
Post ED Visit - Positive Culture Follow-up: Unsuccessful Patient Follow-up  Culture assessed and recommendations reviewed by:   Enzo Bi, Pharm.D.  Celedonio Miyamoto, Pharm.D., BCPS AQ-ID  Garvin Fila, Pharm.D., BCPS  Georgina Pillion, [address].D., BCPS  [place], [address].D., BCPS, AAHIVP  Estella Husk, Pharm.D., BCPS, AAHIVP  Lysle Pearl, PharmD, BCPS  Casilda Carls, PharmD, BCPS  Pollyann Samples, PharmD, BCPS  Positive urine culture, reviewed by Arthor Captain, PA-C   Patient discharged without antimicrobial prescription and treatment is now indicated  Organism is resistant to prescribed ED discharge antimicrobial  Patient with positive blood cultures   Unable to contact patient after 3 attempts, letter will be sent to address on file  Lysle Pearl 01/08/2017, 11:59 AM

## 2017-01-14 ENCOUNTER — Ambulatory Visit (INDEPENDENT_AMBULATORY_CARE_PROVIDER_SITE_OTHER): Payer: Medicare Other | Admitting: Urology

## 2017-01-14 DIAGNOSIS — R339 Retention of urine, unspecified: Secondary | ICD-10-CM

## 2017-01-19 ENCOUNTER — Emergency Department (HOSPITAL_COMMUNITY)
Admission: EM | Admit: 2017-01-19 | Discharge: 2017-01-19 | Disposition: A | Discharge status: Home / Self Care | Payer: Medicare Other | Attending: Emergency Medicine | Admitting: Emergency Medicine

## 2017-01-19 ENCOUNTER — Emergency Department (HOSPITAL_COMMUNITY): Payer: Medicare Other

## 2017-01-19 ENCOUNTER — Encounter (HOSPITAL_COMMUNITY): Payer: Self-pay | Admitting: Emergency Medicine

## 2017-01-19 DIAGNOSIS — I10 Essential (primary) hypertension: Secondary | ICD-10-CM | POA: Insufficient documentation

## 2017-01-19 DIAGNOSIS — N50811 Right testicular pain: Secondary | ICD-10-CM | POA: Diagnosis present

## 2017-01-19 DIAGNOSIS — Z79899 Other long term (current) drug therapy: Secondary | ICD-10-CM | POA: Diagnosis not present

## 2017-01-19 DIAGNOSIS — R52 Pain, unspecified: Secondary | ICD-10-CM

## 2017-01-19 DIAGNOSIS — Z87891 Personal history of nicotine dependence: Secondary | ICD-10-CM | POA: Insufficient documentation

## 2017-01-19 DIAGNOSIS — I2510 Atherosclerotic heart disease of native coronary artery without angina pectoris: Secondary | ICD-10-CM | POA: Diagnosis not present

## 2017-01-19 DIAGNOSIS — J449 Chronic obstructive pulmonary disease, unspecified: Secondary | ICD-10-CM | POA: Insufficient documentation

## 2017-01-19 DIAGNOSIS — Z7982 Long term (current) use of aspirin: Secondary | ICD-10-CM | POA: Insufficient documentation

## 2017-01-19 DIAGNOSIS — N451 Epididymitis: Secondary | ICD-10-CM | POA: Diagnosis not present

## 2017-01-19 DIAGNOSIS — R319 Hematuria, unspecified: Secondary | ICD-10-CM | POA: Diagnosis not present

## 2017-01-19 DIAGNOSIS — N433 Hydrocele, unspecified: Secondary | ICD-10-CM | POA: Diagnosis not present

## 2017-01-19 LAB — COMPREHENSIVE METABOLIC PANEL
ALK PHOS: 79 U/L (ref 38–126)
ALT: 17 U/L (ref 17–63)
AST: 15 U/L (ref 15–41)
Albumin: 3.4 g/dL — ABNORMAL LOW (ref 3.5–5.0)
Anion gap: 11 (ref 5–15)
BUN: 21 mg/dL — AB (ref 6–20)
CALCIUM: 9 mg/dL (ref 8.9–10.3)
CHLORIDE: 95 mmol/L — AB (ref 101–111)
CO2: 25 mmol/L (ref 22–32)
CREATININE: 1.11 mg/dL (ref 0.61–1.24)
GFR calc non Af Amer: >60 mL/min (ref >60)
Glucose, Bld: 116 mg/dL — ABNORMAL HIGH (ref 65–99)
Potassium: 3.5 mmol/L (ref 3.5–5.1)
SODIUM: 131 mmol/L — AB (ref 135–145)
Total Bilirubin: 1 mg/dL (ref 0.3–1.2)
Total Protein: 6.8 g/dL (ref 6.5–8.1)

## 2017-01-19 LAB — CBC WITH DIFFERENTIAL/PLATELET
Basophils Absolute: 0 10*3/uL (ref 0.0–0.1)
Basophils Relative: 0 %
EOS PCT: 0 %
Eosinophils Absolute: 0 10*3/uL (ref 0.0–0.7)
HEMATOCRIT: 36.2 % — AB (ref 39.0–52.0)
HEMOGLOBIN: 12.5 g/dL — AB (ref 13.0–17.0)
LYMPHS ABS: 0.8 10*3/uL (ref 0.7–4.0)
LYMPHS PCT: 3 %
MCH: 31.2 pg (ref 26.0–34.0)
MCHC: 34.5 g/dL (ref 30.0–36.0)
MCV: 90.3 fL (ref 78.0–100.0)
MONO ABS: 1.7 10*3/uL — AB (ref 0.1–1.0)
Monocytes Relative: 6 %
NEUTROS ABS: 26.7 10*3/uL — AB (ref 1.7–7.7)
Neutrophils Relative %: 91 %
Platelets: 258 10*3/uL (ref 150–400)
RBC: 4.01 MIL/uL — AB (ref 4.22–5.81)
RDW: 13.2 % (ref 11.5–15.5)
WBC: 29.3 10*3/uL — ABNORMAL HIGH (ref 4.0–10.5)

## 2017-01-19 LAB — URINALYSIS, ROUTINE W REFLEX MICROSCOPIC
GLUCOSE, UA: 100 mg/dL — AB
Ketones, ur: 15 mg/dL — AB
Nitrite: POSITIVE — AB
Protein, ur: >300 mg/dL — AB
pH: 6.5 (ref 5.0–8.0)

## 2017-01-19 LAB — URINALYSIS, MICROSCOPIC (REFLEX)

## 2017-01-19 LAB — PROTIME-INR
INR: 1.17
Prothrombin Time: 14.8 seconds (ref 11.4–15.2)

## 2017-01-19 MED ORDER — HYDROMORPHONE HCL 1 MG/ML IJ SOLN
1.0000 mg | Freq: Once | INTRAMUSCULAR | Status: AC
  Administered 2017-01-19: 1 mg via INTRAVENOUS
  Filled 2017-01-19: qty 1

## 2017-01-19 MED ORDER — ONDANSETRON HCL 4 MG/2ML IJ SOLN
4.0000 mg | Freq: Once | INTRAMUSCULAR | Status: AC
  Administered 2017-01-19: 4 mg via INTRAVENOUS
  Filled 2017-01-19: qty 2

## 2017-01-19 MED ORDER — LEVOFLOXACIN 500 MG PO TABS: 500.0000 mg | ORAL_TABLET | Freq: Every day | ORAL | 0 refills | Status: DC

## 2017-01-19 MED ORDER — LEVOFLOXACIN IN D5W 750 MG/150ML IV SOLN
750.0000 mg | Freq: Once | INTRAVENOUS | Status: AC
  Administered 2017-01-19: 750 mg via INTRAVENOUS
  Filled 2017-01-19: qty 150

## 2017-01-19 NOTE — ED Provider Notes (Addendum)
[facility] DEPT Provider Note   CSN: [id number] Arrival date & time: 01/19/17  1317     History   Chief Complaint Chief Complaint  Patient presents with  . Testicle Pain    urinary retention    HPI [name] is a 66 y.o. male.  Cc: right testicle pain  HPI:  65 red male. Seen and evaluated here approximately 2 weeks ago with urinary retention. Foley catheter placed. It has been functioning and draining well. Urine was clear until 2 days ago when it became bloody. He again was doing well. However, yesterday and today his right testicle has become painful and swollen and he presents here.  He denies fever, chills, dizziness, lightheadedness, confusion.  Past Medical History:  Diagnosis Date  . Anxiety   . CAD (coronary artery disease)   . Chronic pain   . 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 Dyslipidemia   . History of tobacco abuse    80 pack year history   . Hypertension   . On home O2    4L N/C  . Opioid dependence (HCC)   . Respiratory failure [facility])     Patient Active Problem List   Diagnosis Date Noted  . Cough with hemoptysis 11/13/2016  . 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 Vitamin D insufficiency 04/01/2016  . Elevated C-reactive protein (CRP) 03/28/2016  . Chronic pain syndrome 03/26/2016  . Chronic bilateral low back pain without sciatica (R>L) 03/26/2016  . Long term current use of opiate analgesic 03/26/2016  . Long term prescription opiate use 03/26/2016  . Encounter for therapeutic drug level monitoring 03/26/2016  . Encounter for pain management planning 03/26/2016  . Chronic sacroiliac joint pain (B) (R>L) 03/26/2016  . Chronic hip pain, left 03/26/2016  . Chronic right hip pain 03/26/2016  . Chronic hip pain (B) (L>R) 03/26/2016  . Chronic left sacroiliac pain 03/26/2016  . Chronic right sacroiliac pain 03/26/2016  . HCAP (healthcare-associated pneumonia) 01/08/2016  . PNA (pneumonia) 12/25/2015  . Chronic  respiratory failure with hypoxia (HCC) 12/25/2015  . COPD exacerbation (HCC)   . Sepsis (HCC) 07/03/2015  . CAP (community acquired pneumonia) 07/03/2015  . Acute on chronic respiratory failure with hypoxia (HCC) 07/03/2015  . GERD (gastroesophageal reflux disease) 07/03/2015  . Coronary artery disease 10/13/2013  . Anxiety 10/13/2013  . Dyslipidemia 06/23/2013  . COPD (chronic obstructive pulmonary disease) (HCC) 06/23/2013  . SHOULDER PAIN 08/16/2008  . SHOULDER IMPINGEMENT SYNDROME, LEFT 08/16/2008    Past Surgical History:  Procedure Laterality Date  . CARDIAC CATHETERIZATION  08/10/2008   right & left - normal coronaries (Dr. Claudia Desanctis)  . NM MYOCAR PERF WALL MOTION  07/2008   bruce myoview - mild ischemia in basal inferoseptal & mid inferoseptal regions; EF 63%  . TRANSTHORACIC ECHOCARDIOGRAM  07/2008   EF=>55%; RV mildly dilated; RA mild-mod dilated; mild mitral annular calcification & mild MR; AV mildly sclerotic       Home Medications    Prior to Admission medications   Medication Sig Start Date End Date Taking? Authorizing Provider  albuterol (PROVENTIL HFA;VENTOLIN HFA) 108 (90 BASE) MCG/ACT inhaler Inhale 2 puffs into the lungs every 6 (six) hours as needed for wheezing or shortness of breath.   Yes [provider]  albuterol (PROVENTIL) (2.5 MG/3ML) 0.083% nebulizer solution Take 2.5 mg by nebulization every 4 (four) hours as needed for wheezing or shortness of breath.   Yes [provider]  amLODipine (NORVASC) 5 MG  tablet Take 5 mg by mouth daily.  10/14/16  Yes [provider]  aspirin EC 81 MG tablet Take 162 mg by mouth 3 (three) times daily.    Yes [provider]  benzonatate (TESSALON) 100 MG capsule Take 1 capsule (100 mg total) by mouth 3 (three) times daily as needed for cough. 11/14/16  Yes [name], MD  diazepam (VALIUM) 10 MG tablet Take 10 mg by mouth 3 (three) times daily. Reported on 07/15/2015   Yes [provider]  DULoxetine (CYMBALTA) 20 MG capsule Take 20 mg by mouth at bedtime.    Yes [provider]  finasteride (PROSCAR) 5 MG tablet Take 1 tablet by mouth daily. 01/14/17  Yes [provider]  fludrocortisone (FLORINEF) 0.1 MG tablet Take 0.1 mg by mouth daily.  11/01/16  Yes [provider]  lactulose (CHRONULAC) 10 GM/15ML solution 10 g 4 (four) times daily.  02/11/16  Yes [provider]  Multiple Vitamin (MULTIVITAMIN WITH MINERALS) TABS tablet Take 1 tablet by mouth daily.   Yes [provider]  omeprazole (PRILOSEC) 40 MG capsule Take 40 mg by mouth daily.  01/10/13  Yes [provider]  oxyCODONE-acetaminophen (PERCOCET) 7.5-325 MG per tablet Take 1 tablet by mouth every 4 (four) hours as needed for pain.   Yes [provider]  potassium chloride SA (K-DUR,KLOR-CON) 20 MEQ tablet Take 1 tablet by mouth daily. 01/08/17  Yes [provider]  Propylene Glycol (SYSTANE BALANCE) 0.6 % SOLN Place 1 drop into both eyes 2 (two) times daily as needed (dry eyes).   Yes [provider]  simvastatin (ZOCOR) 20 MG tablet TAKE 1 TABLET BY MOUTH DAILY AT 6 PM. 09/21/15  Yes Laqueta Linden, MD  SYMBICORT 160-4.5 MCG/ACT inhaler Inhale 2 puffs into the lungs 2 (two) times daily. 12/21/15  Yes [provider]  tamsulosin (FLOMAX) 0.4 MG CAPS capsule Take 1 capsule by mouth daily. 01/14/17  Yes [provider]  tiZANidine (ZANAFLEX) 4 MG tablet Take 1 tablet by mouth daily as needed for muscle spasms. 01/03/17  Yes [provider]  cephALEXin (KEFLEX) 500 MG capsule Take 500 mg by mouth 2 (two) times daily.    [provider]  levofloxacin (LEVAQUIN) 500 MG tablet Take 1 tablet (500 mg total) by mouth daily. 01/19/17   [name], MD  OXYGEN Inhale 4 L into the lungs continuous.    [provider]    Family History Family History  Problem Relation Age of Onset  . Leukemia  Mother   . Suicidality Father     Social History Social History  Substance Use Topics  . Smoking status: Former Smoker    Start date: 04/21/1974    Quit date: 06/22/2007  . Smokeless tobacco: Never Used  . Alcohol use No     Allergies   Ativan [lorazepam]   Review of Systems Review of Systems  Constitutional: Negative for appetite change, chills, diaphoresis, fatigue and fever.  HENT: Negative for mouth sores, sore throat and trouble swallowing.   Eyes: Negative for visual disturbance.  Respiratory: Negative for cough, chest tightness, shortness of breath and wheezing.   Cardiovascular: Negative for chest pain.  Gastrointestinal: Negative for abdominal distention, abdominal pain, diarrhea, nausea and vomiting.  Endocrine: Negative for polydipsia, polyphagia and polyuria.  Genitourinary: Positive for hematuria, scrotal swelling and testicular pain. Negative for dysuria and frequency.  Musculoskeletal: Negative for gait problem.  Skin: Negative for color change, pallor and rash.  Neurological: Negative for dizziness, syncope, light-headedness and headaches.  Hematological: Does not bruise/bleed easily.  Psychiatric/Behavioral: Negative for behavioral problems and confusion.     Physical Exam Updated Vital Signs BP 121/87   Pulse (!) 108   Temp 98.3 F (36.8 C) (Oral)   Resp 18   SpO2 98%   Physical Exam  Constitutional: He is oriented to person, place, and time. He appears well-developed and well-nourished. No distress.  Awake alert. He appears uncomfortable movements. Not tachycardic or febrile. Not hypotensive.  HENT:  Head: Normocephalic.  Eyes: Pupils are equal, round, and reactive to light. Conjunctivae are normal. No scleral icterus.  Neck: Normal range of motion. Neck supple. No thyromegaly present.  Cardiovascular: Normal rate and regular rhythm.  Exam reveals no gallop and no friction rub.   No murmur heard. Pulmonary/Chest: Effort normal and breath sounds  normal. No respiratory distress. He has no wheezes. He has no rales.  Abdominal: Soft. Bowel sounds are normal. He exhibits no distension. There is no tenderness. There is no rebound.  Genitourinary:  Genitourinary Comments: Right hemiscrotum is markedly tender. His scrotal skin is erythematous. There is no crepitus or subcutaneous air. No tenderness in the groin. No suprapubic tenderness. Bedside ultrasound shows no urinary retention. Foley catheter draining bloody urine. Testicle and epididymis are markedly enlarged in size right versus left.  Musculoskeletal: Normal range of motion.  Neurological: He is alert and oriented to person, place, and time.  Skin: Skin is warm and dry. No rash noted.  Psychiatric: He has a normal mood and affect. His behavior is normal.     ED Treatments / Results  Labs (all labs ordered are listed, but only abnormal results are displayed) Labs Reviewed  URINALYSIS, ROUTINE W REFLEX MICROSCOPIC - Abnormal; Notable for the following:       Result Value   Color, Urine BROWN (*)    APPearance HAZY (*)    Specific Gravity, Urine >1.030 (*)    Glucose, UA 100 (*)    Hgb urine dipstick LARGE (*)    Bilirubin Urine MODERATE (*)    Ketones, ur 15 (*)    Protein, ur >300 (*)    Nitrite POSITIVE (*)    Leukocytes, UA MODERATE (*)    All other components within normal limits  CBC WITH DIFFERENTIAL/PLATELET - Abnormal; Notable for the following:    WBC 29.3 (*)    RBC 4.01 (*)    Hemoglobin 12.5 (*)    HCT 36.2 (*)    Neutro Abs 26.7 (*)    Monocytes Absolute 1.7 (*)    All other components within normal limits  COMPREHENSIVE METABOLIC PANEL - Abnormal; Notable for the following:    Sodium 131 (*)    Chloride 95 (*)    Glucose, Bld 116 (*)    BUN 21 (*)    Albumin 3.4 (*)    All other components within normal limits  URINALYSIS, MICROSCOPIC (REFLEX) - Abnormal; Notable for the following:    Bacteria, UA MANY (*)    Squamous Epithelial / LPF 0-5 (*)     All other components within normal limits  URINE CULTURE  PROTIME-INR    EKG  EKG Interpretation None       Radiology [place] Scrotom W/doppler  Result Date: 01/19/2017 CLINICAL DATA:  Right testicular pain and swelling for 2 days. Vomiting. Urinary tract infection. EXAM: SCROTAL ULTRASOUND DOPPLER ULTRASOUND OF THE TESTICLES TECHNIQUE: Complete ultrasound examination of the testicles, epididymis, and other scrotal structures was performed.  Color and spectral Doppler ultrasound were also utilized to evaluate blood flow to the testicles. COMPARISON:  None FINDINGS: Right testicle Measurements: 5.4 x 3.8 x 3.9 cm. No mass or microlithiasis visualized. Asymmetric increased blood flow noted in the right testicle compared to the left. Left testicle Measurements: 4.6 x 3.1 x 3.4 cm. No mass or microlithiasis visualized. Internal blood flow noted. Right epididymis: Asymmetric enlargement of right epididymal head with increased blood flow on color Doppler ultrasound. Left epididymis:  Normal in size and appearance. Hydrocele: A moderate complex right hydrocele is seen containing numerous thin internal septations. Small simple appearing left-sided hydrocele. Varicocele:  Small left-sided varicocele noted. Pulsed Doppler interrogation of both testes demonstrates normal low resistance arterial and venous waveforms bilaterally. IMPRESSION: Right-sided epididymo-orchitis. Moderate complex right hydrocele. No evidence of testicular mass or torsion. Electronically Signed   By: Myles Rosenthal M.D.   On: 01/19/2017 15:11    Procedures Procedures (including critical care time)  Medications Ordered in ED Medications  levofloxacin (LEVAQUIN) IVPB 750 mg (750 mg Intravenous New Bag/Given 01/19/17 1516)  HYDROmorphone (DILAUDID) injection 1 mg (1 mg Intravenous Given 01/19/17 1516)  ondansetron (ZOFRAN) injection 4 mg (4 mg Intravenous Given 01/19/17 1516)     Initial Impression / Assessment and Plan / ED Course  I  have reviewed the triage vital signs and the nursing notes.  Pertinent labs & imaging results that were available during my care of the patient were reviewed by me and considered in my medical decision making (see chart for details).     Ultrasound bedside confirms no urinary retention. Formal ultrasound shows right epididymoorchitis. Varicocele. No abscess. Patient given IV Levaquin after review of his previous sensitivities. He has recovered quickly after completing amoxicillin. Given 750 IV Levaquin. I discussed the case with Dr. Alvester Morin [facility]. Patient has appointment in 48 hours. He does not appear septic or toxic. He has marked leukocytosis but stable vital signs. My question for Dr. Alvester Morin was whether or not we should continue the patient's Foley catheter and he felt that continuing it would be in the patient's best interest and could be removed at clinic on Wednesday morning. This was discussed at length with the patient. He was given 1 dose of IV Dilaudid and states his symptoms are much improved. He is stable and appropriate for discharge. Return here with a new or worsening symptoms in the interval between now and his appointment   Final Clinical Impressions(s) / ED Diagnoses   Final diagnoses:  Epididymitis    New Prescriptions New Prescriptions   LEVOFLOXACIN (LEVAQUIN) 500 MG TABLET    Take 1 tablet (500 mg total) by mouth daily.     [name], MD 01/19/17 1638    [name], MD 01/19/17 [phone]

## 2017-01-19 NOTE — Discharge Instructions (Signed)
Start Rx for Levaquin tomorrow. You were given the first dose here today. Keep your appointment with Urology Wednesday.

## 2017-01-19 NOTE — ED Triage Notes (Signed)
Urinary cath x 2 weeks. Urology apt in 2 days. Right testicle started swelling Saturday. Pt c/o pain. Deep breathing and restlessness noted. States catheter is draining.

## 2017-01-21 ENCOUNTER — Ambulatory Visit (INDEPENDENT_AMBULATORY_CARE_PROVIDER_SITE_OTHER): Payer: Medicare Other | Admitting: Urology

## 2017-01-21 DIAGNOSIS — R339 Retention of urine, unspecified: Secondary | ICD-10-CM | POA: Diagnosis not present

## 2017-01-21 DIAGNOSIS — N453 Epididymo-orchitis: Secondary | ICD-10-CM | POA: Diagnosis not present

## 2017-01-22 LAB — URINE CULTURE

## 2017-01-23 ENCOUNTER — Telehealth: Payer: Self-pay | Admitting: *Deleted

## 2017-01-23 NOTE — Telephone Encounter (Signed)
Post ED Visit - Positive Culture Follow-up  Culture report reviewed by antimicrobial stewardship pharmacist:   Enzo Bi, Pharm.D.  Celedonio Miyamoto, Pharm.D., BCPS AQ-ID  Garvin Fila, Pharm.D., BCPS  Georgina Pillion, Pharm.D., BCPS  [place], [place].D., BCPS, AAHIVP  Estella Husk, Pharm.D., BCPS, AAHIVP  Lysle Pearl, PharmD, BCPS  Casilda Carls, PharmD, BCPS  Pollyann Samples, PharmD, BCPS Lilli Light, PharmD  Positive urine culture Treated with Levofloxacin, organism sensitive to the same and no further patient follow-up is required at this time.  Virl Axe Talley 01/23/2017, 10:04 AM

## 2017-01-28 ENCOUNTER — Ambulatory Visit (INDEPENDENT_AMBULATORY_CARE_PROVIDER_SITE_OTHER): Payer: Medicare Other | Admitting: Urology

## 2017-01-28 ENCOUNTER — Ambulatory Visit (HOSPITAL_COMMUNITY)
Admission: RE | Admit: 2017-01-28 | Discharge: 2017-01-28 | Disposition: A | Discharge status: Home / Self Care | Payer: Medicare Other | Source: Ambulatory Visit | Attending: Urology | Admitting: Urology

## 2017-01-28 ENCOUNTER — Other Ambulatory Visit: Payer: Self-pay | Admitting: Urology

## 2017-01-28 DIAGNOSIS — N433 Hydrocele, unspecified: Secondary | ICD-10-CM | POA: Insufficient documentation

## 2017-01-28 DIAGNOSIS — N453 Epididymo-orchitis: Secondary | ICD-10-CM | POA: Diagnosis not present

## 2017-01-28 DIAGNOSIS — Z6824 Body mass index (BMI) 24.0-24.9, adult: Secondary | ICD-10-CM | POA: Diagnosis not present

## 2017-02-05 DIAGNOSIS — J441 Chronic obstructive pulmonary disease with (acute) exacerbation: Secondary | ICD-10-CM | POA: Diagnosis not present

## 2017-02-10 DIAGNOSIS — K219 Gastro-esophageal reflux disease without esophagitis: Secondary | ICD-10-CM | POA: Diagnosis not present

## 2017-02-10 DIAGNOSIS — M1991 Primary osteoarthritis, unspecified site: Secondary | ICD-10-CM | POA: Diagnosis not present

## 2017-02-10 DIAGNOSIS — J42 Unspecified chronic bronchitis: Secondary | ICD-10-CM | POA: Diagnosis not present

## 2017-02-10 DIAGNOSIS — Z6824 Body mass index (BMI) 24.0-24.9, adult: Secondary | ICD-10-CM | POA: Diagnosis not present

## 2017-03-04 ENCOUNTER — Ambulatory Visit (INDEPENDENT_AMBULATORY_CARE_PROVIDER_SITE_OTHER): Payer: Medicare Other | Admitting: Urology

## 2017-03-04 DIAGNOSIS — N453 Epididymo-orchitis: Secondary | ICD-10-CM

## 2017-03-04 DIAGNOSIS — R339 Retention of urine, unspecified: Secondary | ICD-10-CM | POA: Diagnosis not present

## 2017-03-08 DIAGNOSIS — J441 Chronic obstructive pulmonary disease with (acute) exacerbation: Secondary | ICD-10-CM | POA: Diagnosis not present

## 2017-03-09 DIAGNOSIS — J42 Unspecified chronic bronchitis: Secondary | ICD-10-CM | POA: Diagnosis not present

## 2017-03-09 DIAGNOSIS — J449 Chronic obstructive pulmonary disease, unspecified: Secondary | ICD-10-CM | POA: Diagnosis not present

## 2017-03-09 DIAGNOSIS — Z0001 Encounter for general adult medical examination with abnormal findings: Secondary | ICD-10-CM | POA: Diagnosis not present

## 2017-03-09 DIAGNOSIS — Z1389 Encounter for screening for other disorder: Secondary | ICD-10-CM | POA: Diagnosis not present

## 2017-03-09 DIAGNOSIS — G43909 Migraine, unspecified, not intractable, without status migrainosus: Secondary | ICD-10-CM | POA: Diagnosis not present

## 2017-03-16 ENCOUNTER — Telehealth: Payer: Self-pay | Admitting: Emergency Medicine

## 2017-03-16 NOTE — Telephone Encounter (Signed)
Lost to followup 

## 2017-03-25 DIAGNOSIS — K219 Gastro-esophageal reflux disease without esophagitis: Secondary | ICD-10-CM | POA: Diagnosis not present

## 2017-03-25 DIAGNOSIS — J449 Chronic obstructive pulmonary disease, unspecified: Secondary | ICD-10-CM | POA: Diagnosis not present

## 2017-03-25 DIAGNOSIS — G894 Chronic pain syndrome: Secondary | ICD-10-CM | POA: Diagnosis not present

## 2017-04-07 DIAGNOSIS — J441 Chronic obstructive pulmonary disease with (acute) exacerbation: Secondary | ICD-10-CM | POA: Diagnosis not present

## 2017-05-08 DIAGNOSIS — J441 Chronic obstructive pulmonary disease with (acute) exacerbation: Secondary | ICD-10-CM | POA: Diagnosis not present

## 2017-05-23 IMAGING — DX DG CHEST 2V
2 series · 2 of 2 positions shown · non-contrast
Comparison: 12/25/2015.  07/15/2015

CLINICAL DATA: Bilateral pneumonia 1 week ago.  No symptoms now.

EXAM:
CHEST  2 VIEW

[chest pa]
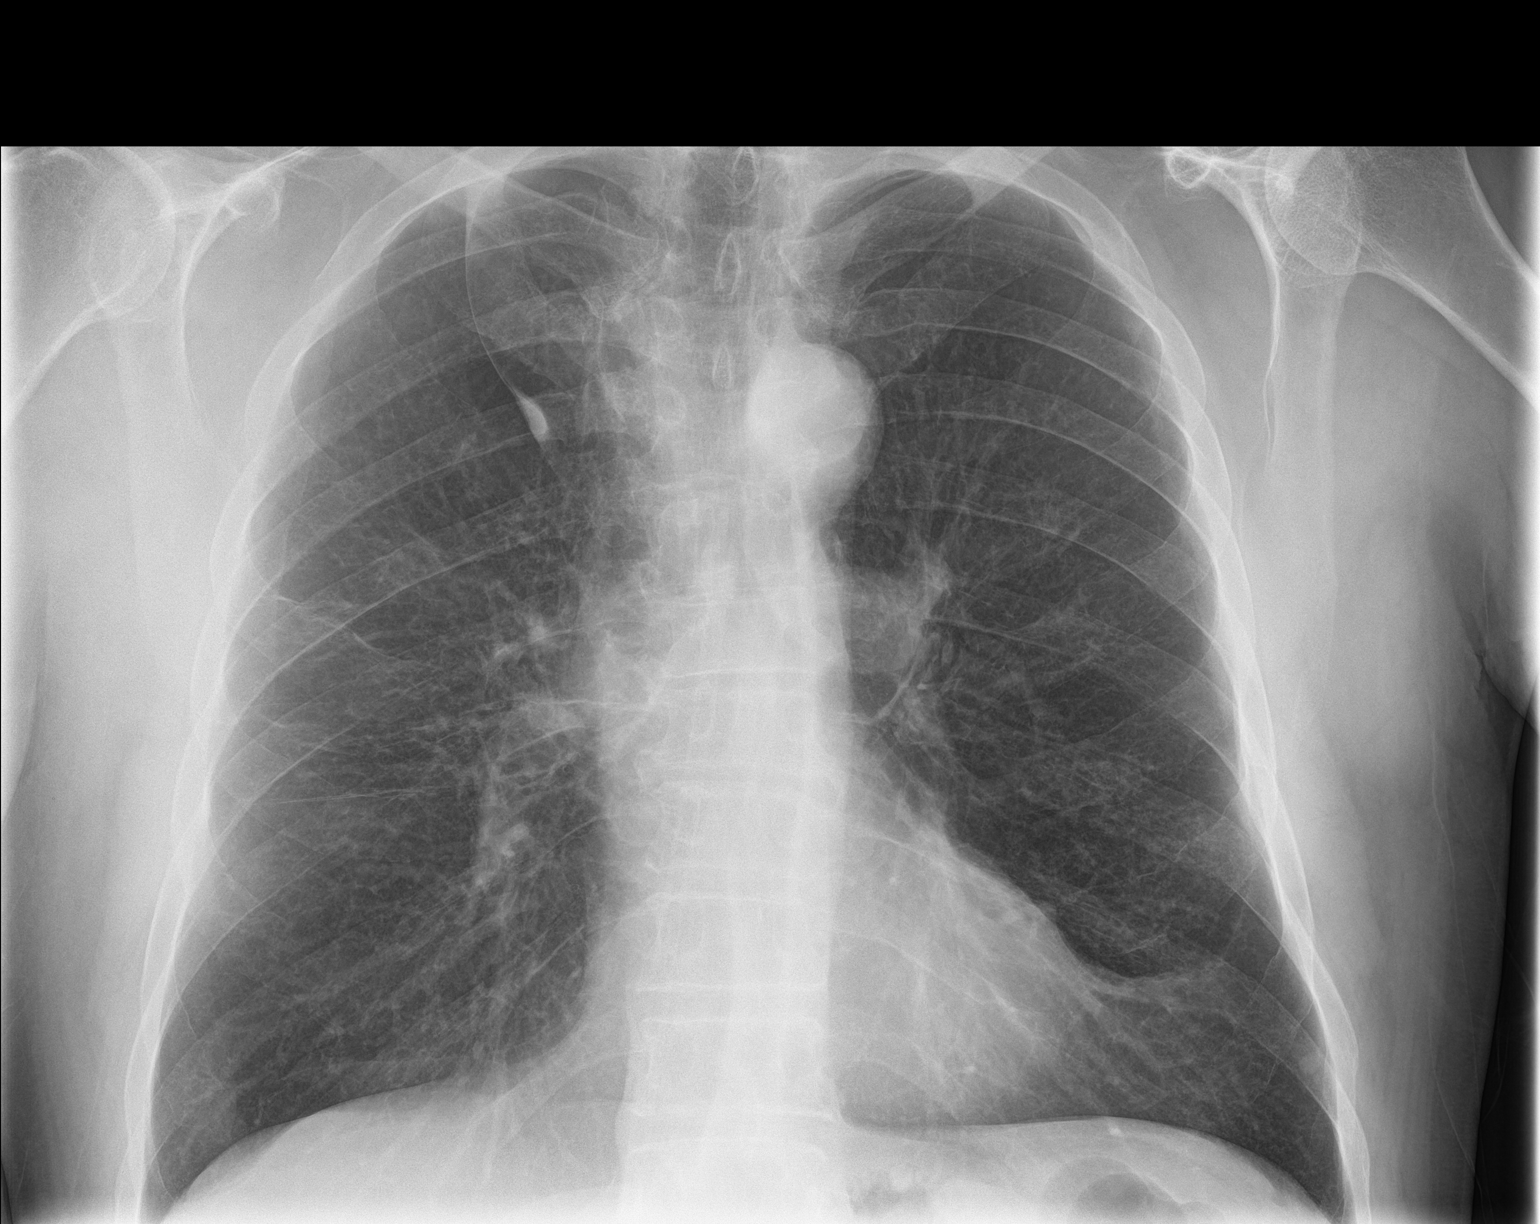

[chest lat]
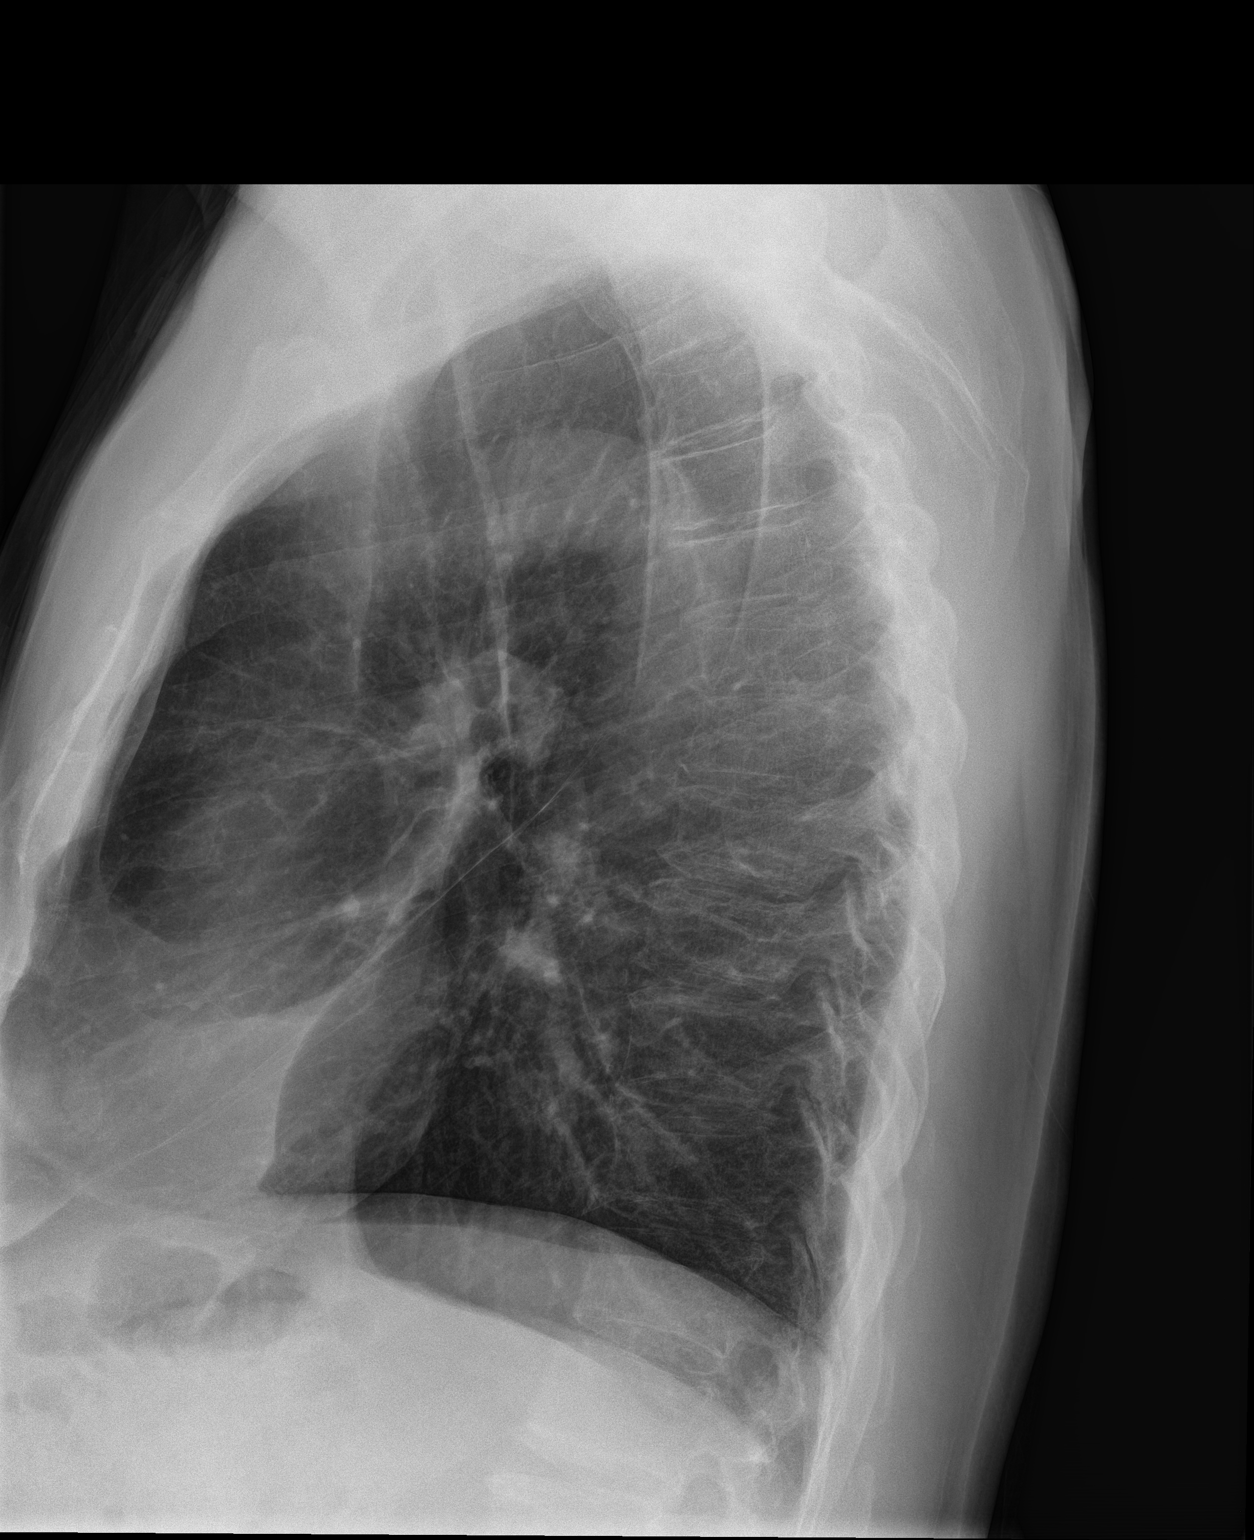

[2 of 2 positions shown; findings below may reference images not displayed]

FINDINGS: The relatively diffuse airspace disease seen previously in the right
lung has resolved in the interval. Imaging features now similar to
prior study of 07/15/2015. Lungs are hyperexpanded. Interstitial
markings are diffusely coarsened with chronic features. The
cardiopericardial silhouette is within normal limits for size. The
visualized bony structures of the thorax are intact.
IMPRESSION: Interval resolution of airspace disease with no acute
cardiopulmonary findings on today's study.

Emphysema.

## 2017-06-08 DIAGNOSIS — J441 Chronic obstructive pulmonary disease with (acute) exacerbation: Secondary | ICD-10-CM | POA: Diagnosis not present

## 2017-06-23 DIAGNOSIS — E119 Type 2 diabetes mellitus without complications: Secondary | ICD-10-CM | POA: Diagnosis not present

## 2017-06-23 DIAGNOSIS — Z1389 Encounter for screening for other disorder: Secondary | ICD-10-CM | POA: Diagnosis not present

## 2017-06-23 DIAGNOSIS — G894 Chronic pain syndrome: Secondary | ICD-10-CM | POA: Diagnosis not present

## 2017-06-30 ENCOUNTER — Encounter (HOSPITAL_COMMUNITY): Payer: Self-pay | Admitting: Emergency Medicine

## 2017-06-30 ENCOUNTER — Other Ambulatory Visit: Payer: Self-pay

## 2017-06-30 ENCOUNTER — Emergency Department (HOSPITAL_COMMUNITY)
Admission: EM | Admit: 2017-06-30 | Discharge: 2017-06-30 | Disposition: A | Discharge status: Home / Self Care | Payer: Medicare Other | Attending: Emergency Medicine | Admitting: Emergency Medicine

## 2017-06-30 DIAGNOSIS — R350 Frequency of micturition: Secondary | ICD-10-CM | POA: Diagnosis present

## 2017-06-30 DIAGNOSIS — Z87891 Personal history of nicotine dependence: Secondary | ICD-10-CM | POA: Diagnosis not present

## 2017-06-30 DIAGNOSIS — Z7982 Long term (current) use of aspirin: Secondary | ICD-10-CM | POA: Diagnosis not present

## 2017-06-30 DIAGNOSIS — I10 Essential (primary) hypertension: Secondary | ICD-10-CM | POA: Diagnosis not present

## 2017-06-30 DIAGNOSIS — I2510 Atherosclerotic heart disease of native coronary artery without angina pectoris: Secondary | ICD-10-CM | POA: Insufficient documentation

## 2017-06-30 DIAGNOSIS — J449 Chronic obstructive pulmonary disease, unspecified: Secondary | ICD-10-CM | POA: Diagnosis not present

## 2017-06-30 DIAGNOSIS — Z79899 Other long term (current) drug therapy: Secondary | ICD-10-CM | POA: Diagnosis not present

## 2017-06-30 DIAGNOSIS — R309 Painful micturition, unspecified: Secondary | ICD-10-CM | POA: Diagnosis not present

## 2017-06-30 DIAGNOSIS — N3001 Acute cystitis with hematuria: Secondary | ICD-10-CM | POA: Diagnosis not present

## 2017-06-30 LAB — URINALYSIS, MICROSCOPIC (REFLEX): Squamous Epithelial / LPF: NONE SEEN

## 2017-06-30 LAB — URINALYSIS, ROUTINE W REFLEX MICROSCOPIC
BILIRUBIN URINE: NEGATIVE
GLUCOSE, UA: NEGATIVE mg/dL
KETONES UR: NEGATIVE mg/dL
Nitrite: NEGATIVE
PH: 7 (ref 5.0–8.0)
Protein, ur: 100 mg/dL — AB
Specific Gravity, Urine: 1.015 (ref 1.005–1.030)

## 2017-06-30 MED ORDER — CEPHALEXIN 500 MG PO CAPS: 500.0000 mg | ORAL_CAPSULE | Freq: Four times a day (QID) | ORAL | 0 refills | Status: DC

## 2017-06-30 MED ORDER — LIDOCAINE HCL (PF) 1 % IJ SOLN
INTRAMUSCULAR | Status: AC
  Administered 2017-06-30: 2 mL
  Filled 2017-06-30: qty 2

## 2017-06-30 MED ORDER — AMPICILLIN 500 MG PO CAPS: 500.0000 mg | ORAL_CAPSULE | Freq: Three times a day (TID) | ORAL | 0 refills | Status: DC

## 2017-06-30 MED ORDER — CEFTRIAXONE SODIUM 1 G IJ SOLR
1.0000 g | Freq: Once | INTRAMUSCULAR | Status: AC
  Administered 2017-06-30: 1 g via INTRAMUSCULAR
  Filled 2017-06-30: qty 10

## 2017-06-30 NOTE — ED Triage Notes (Signed)
Frequent and painful urination since this am. Odor to urine as well.

## 2017-06-30 NOTE — Discharge Instructions (Signed)
Your blood pressure is elevated at 160/93 today, please have this checked soon.  Your urine test reveals an infection.  Please use Keflex with breakfast, lunch, dinner, and at bedtime.  Please use ampicillin 3 times daily with food.  It is important that she see the physicians at the urology center as soon as possible for follow-up and also for reassessment of the status of your prostate gland.  Please return to the emergency department immediately if any high fever, nausea vomiting, unusual back pain, changes in your condition, problems, or concerns.

## 2017-06-30 NOTE — ED Provider Notes (Signed)
[facility] EMERGENCY DEPARTMENT Provider Note   CSN: [id number] Arrival date & time: 06/30/17  0757     History   Chief Complaint Chief Complaint  Patient presents with  . Urinary Frequency    HPI [name] is a 67 y.o. male.  Pt has hx of enlarged prostate. He is seen by urology and takes medication to make urination easier.   The history is provided by the patient.  Dysuria   This is a recurrent problem. The current episode started 3 to 5 hours ago. The problem occurs every urination. The problem has been gradually worsening. The quality of the pain is described as burning. The pain is moderate. There has been no fever. He is not sexually active. There is a history of pyelonephritis. Associated symptoms include frequency and hematuria. Pertinent negatives include no chills, no sweats, no nausea and no vomiting. Associated symptoms comments: constipation. He has tried nothing for the symptoms. His past medical history is significant for recurrent UTIs. His past medical history does not include kidney stones or single kidney.    Past Medical History:  Diagnosis Date  . Anxiety   . CAD (coronary artery disease)   . Chronic pain   . 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 Dyslipidemia   . History of tobacco abuse    80 pack year history   . Hypertension   . On home O2    4L N/C  . Opioid dependence (HCC)   . Respiratory failure [facility])     Patient Active Problem List   Diagnosis Date Noted  . Cough with hemoptysis 11/13/2016  . 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 Vitamin D insufficiency 04/01/2016  . Elevated C-reactive protein (CRP) 03/28/2016  . Chronic pain syndrome 03/26/2016  . Chronic bilateral low back pain without sciatica (R>L) 03/26/2016  . Long term current use of opiate analgesic 03/26/2016  . Long term prescription opiate use 03/26/2016  . Encounter for therapeutic drug level monitoring 03/26/2016  . Encounter for pain management  planning 03/26/2016  . Chronic sacroiliac joint pain (B) (R>L) 03/26/2016  . Chronic hip pain, left 03/26/2016  . Chronic right hip pain 03/26/2016  . Chronic hip pain (B) (L>R) 03/26/2016  . Chronic left sacroiliac pain 03/26/2016  . Chronic right sacroiliac pain 03/26/2016  . HCAP (healthcare-associated pneumonia) 01/08/2016  . PNA (pneumonia) 12/25/2015  . Chronic respiratory failure with hypoxia (HCC) 12/25/2015  . COPD exacerbation (HCC)   . Sepsis (HCC) 07/03/2015  . CAP (community acquired pneumonia) 07/03/2015  . Acute on chronic respiratory failure with hypoxia (HCC) 07/03/2015  . GERD (gastroesophageal reflux disease) 07/03/2015  . Coronary artery disease 10/13/2013  . Anxiety 10/13/2013  . Dyslipidemia 06/23/2013  . COPD (chronic obstructive pulmonary disease) (HCC) 06/23/2013  . SHOULDER PAIN 08/16/2008  . SHOULDER IMPINGEMENT SYNDROME, LEFT 08/16/2008    Past Surgical History:  Procedure Laterality Date  . CARDIAC CATHETERIZATION  08/10/2008   right & left - normal coronaries (Dr. Claudia Desanctis)  . NM MYOCAR PERF WALL MOTION  07/2008   bruce myoview - mild ischemia in basal inferoseptal & mid inferoseptal regions; EF 63%  . TRANSTHORACIC ECHOCARDIOGRAM  07/2008   EF=>55%; RV mildly dilated; RA mild-mod dilated; mild mitral annular calcification & mild MR; AV mildly sclerotic       Home Medications    Prior to Admission medications   Medication Sig Start Date End Date Taking? Authorizing Provider  albuterol (PROVENTIL HFA;VENTOLIN HFA) 108 (90  BASE) MCG/ACT inhaler Inhale 2 puffs into the lungs every 6 (six) hours as needed for wheezing or shortness of breath.    [provider]  albuterol (PROVENTIL) (2.5 MG/3ML) 0.083% nebulizer solution Take 2.5 mg by nebulization every 4 (four) hours as needed for wheezing or shortness of breath.    [provider]  amLODipine (NORVASC) 5 MG tablet Take 5 mg by mouth daily.  10/14/16   [provider]    aspirin EC 81 MG tablet Take 162 mg by mouth 3 (three) times daily.     [provider]  benzonatate (TESSALON) 100 MG capsule Take 1 capsule (100 mg total) by mouth 3 (three) times daily as needed for cough. 11/14/16   Elliot CousinFisher, Denise, MD  cephALEXin (KEFLEX) 500 MG capsule Take 500 mg by mouth 2 (two) times daily.    [provider]  diazepam (VALIUM) 10 MG tablet Take 10 mg by mouth 3 (three) times daily. Reported on 07/15/2015    [provider]  DULoxetine (CYMBALTA) 20 MG capsule Take 20 mg by mouth at bedtime.     [provider]  finasteride (PROSCAR) 5 MG tablet Take 1 tablet by mouth daily. 01/14/17   [provider]  fludrocortisone (FLORINEF) 0.1 MG tablet Take 0.1 mg by mouth daily.  11/01/16   [provider]  lactulose (CHRONULAC) 10 GM/15ML solution 10 g 4 (four) times daily.  02/11/16   [provider]  levofloxacin (LEVAQUIN) 500 MG tablet Take 1 tablet (500 mg total) by mouth daily. 01/19/17   Rolland PorterJames, Mark, MD  levofloxacin (LEVAQUIN) 500 MG tablet Take 1 tablet (500 mg total) by mouth daily. 01/19/17   Rolland PorterJames, Mark, MD  Multiple Vitamin (MULTIVITAMIN WITH MINERALS) TABS tablet Take 1 tablet by mouth daily.    [provider]  omeprazole (PRILOSEC) 40 MG capsule Take 40 mg by mouth daily.  01/10/13   [provider]  oxyCODONE-acetaminophen (PERCOCET) 7.5-325 MG per tablet Take 1 tablet by mouth every 4 (four) hours as needed for pain.    [provider]  OXYGEN Inhale 4 L into the lungs continuous.    [provider]  potassium chloride SA (K-DUR,KLOR-CON) 20 MEQ tablet Take 1 tablet by mouth daily. 01/08/17   [provider]  Propylene Glycol (SYSTANE BALANCE) 0.6 % SOLN Place 1 drop into both eyes 2 (two) times daily as needed (dry eyes).    [provider]  simvastatin (ZOCOR) 20 MG tablet TAKE 1 TABLET BY MOUTH DAILY AT 6 PM. 09/21/15   Laqueta LindenKoneswaran, Suresh A, MD  SYMBICORT  160-4.5 MCG/ACT inhaler Inhale 2 puffs into the lungs 2 (two) times daily. 12/21/15   [provider]  tamsulosin (FLOMAX) 0.4 MG CAPS capsule Take 1 capsule by mouth daily. 01/14/17   [provider]  tiZANidine (ZANAFLEX) 4 MG tablet Take 1 tablet by mouth daily as needed for muscle spasms. 01/03/17   [provider]    Family History Family History  Problem Relation Age of Onset  . Leukemia Mother   . Suicidality Father     Social History Social History   Tobacco Use  . Smoking status: Former Smoker    Start date: 04/21/1974    Last attempt to quit: 06/22/2007    Years since quitting: 10.0  . Smokeless tobacco: Never Used  Substance Use Topics  . Alcohol use: No    Alcohol/week: 0.0 oz  . Drug use: No     Allergies  Ativan [lorazepam]   Review of Systems Review of Systems  Constitutional: Negative for activity change and chills.       All ROS Neg except as noted in HPI  HENT: Negative for nosebleeds.   Eyes: Negative for photophobia and discharge.  Respiratory: Negative for cough, shortness of breath and wheezing.   Cardiovascular: Negative for chest pain and palpitations.  Gastrointestinal: Negative for abdominal pain, blood in stool, nausea and vomiting.  Genitourinary: Positive for dysuria, frequency and hematuria.  Musculoskeletal: Negative for arthralgias, back pain and neck pain.  Skin: Negative.   Neurological: Negative for dizziness, seizures and speech difficulty.  Psychiatric/Behavioral: Negative for confusion and hallucinations.     Physical Exam Updated Vital Signs BP (!) 160/93 (BP Location: Left Arm)   Pulse (!) 107   Temp 97.9 F (36.6 C) (Oral)   Resp (!) 22   Ht 5\' 9"  (1.753 m)   Wt 87.1 kg (192 lb)   SpO2 95%   BMI 28.35 kg/m   Physical Exam  Constitutional: He is oriented to person, place, and time. He appears well-developed and well-nourished.  Non-toxic appearance.  HENT:  Head: Normocephalic.  Right Ear:  Tympanic membrane and external ear normal.  Left Ear: Tympanic membrane and external ear normal.  Eyes: EOM and lids are normal. Pupils are equal, round, and reactive to light.  Neck: Normal range of motion. Neck supple. Carotid bruit is not present.  Cardiovascular: Regular rhythm, normal heart sounds, intact distal pulses and normal pulses. Tachycardia present.  Pulmonary/Chest: Breath sounds normal. No respiratory distress.  Abdominal: Soft. Bowel sounds are normal. There is no tenderness. There is no guarding.  No CVA tenderness  Musculoskeletal: Normal range of motion.  Lymphadenopathy:       Head (right side): No submandibular adenopathy present.       Head (left side): No submandibular adenopathy present.    He has no cervical adenopathy.  Neurological: He is alert and oriented to person, place, and time. He has normal strength. No cranial nerve deficit or sensory deficit.  Skin: Skin is warm and dry.  Psychiatric: He has a normal mood and affect. His speech is normal.  Nursing note and vitals reviewed.    ED Treatments / Results  Labs (all labs ordered are listed, but only abnormal results are displayed) Labs Reviewed  URINALYSIS, ROUTINE W REFLEX MICROSCOPIC    EKG  EKG Interpretation None       Radiology No results found.  Procedures Procedures (including critical care time)  Medications Ordered in ED Medications - No data to display   Initial Impression / Assessment and Plan / ED Course  I have reviewed the triage vital signs and the nursing notes.  Pertinent labs & imaging results that were available during my care of the patient were reviewed by me and considered in my medical decision making (see chart for details).       Final Clinical Impressions(s) / ED Diagnoses Blood pressure is elevated at 160/93, heart rate is elevated at 107, otherwise vital signs within normal limits.  I have reviewed the previous emergency department visits, as well as  the urine culture.  Most recent urine culture shows Serratia and enterococcus.  Will use ceftriaxone and ampicillin.  Patient and his used Levaquin in the past.  Will hold on Levaquin for now due to the black box warnings.  We will leave further use of Levaquin at the discretion of urology specialist.   Final diagnoses:  Acute cystitis with  hematuria    ED Discharge Orders        Ordered    cephALEXin (KEFLEX) 500 MG capsule  4 times daily     06/30/17 0924    ampicillin (PRINCIPEN) 500 MG capsule  3 times daily     06/30/17 0924       Ivery Quale, PA-C 06/30/17 4098    Bethann Berkshire, MD 06/30/17 1544

## 2017-07-02 LAB — URINE CULTURE: Culture: <10000 — AB

## 2017-07-06 DIAGNOSIS — J441 Chronic obstructive pulmonary disease with (acute) exacerbation: Secondary | ICD-10-CM | POA: Diagnosis not present

## 2017-07-07 ENCOUNTER — Ambulatory Visit (INDEPENDENT_AMBULATORY_CARE_PROVIDER_SITE_OTHER): Payer: Medicare Other | Admitting: Urology

## 2017-07-07 DIAGNOSIS — N5201 Erectile dysfunction due to arterial insufficiency: Secondary | ICD-10-CM | POA: Diagnosis not present

## 2017-07-07 DIAGNOSIS — R339 Retention of urine, unspecified: Secondary | ICD-10-CM

## 2017-08-06 DIAGNOSIS — J441 Chronic obstructive pulmonary disease with (acute) exacerbation: Secondary | ICD-10-CM | POA: Diagnosis not present

## 2017-09-02 ENCOUNTER — Ambulatory Visit (INDEPENDENT_AMBULATORY_CARE_PROVIDER_SITE_OTHER): Payer: Medicare Other | Admitting: Urology

## 2017-09-02 DIAGNOSIS — R339 Retention of urine, unspecified: Secondary | ICD-10-CM | POA: Diagnosis not present

## 2017-09-02 DIAGNOSIS — N5201 Erectile dysfunction due to arterial insufficiency: Secondary | ICD-10-CM

## 2017-09-05 DIAGNOSIS — J441 Chronic obstructive pulmonary disease with (acute) exacerbation: Secondary | ICD-10-CM | POA: Diagnosis not present

## 2017-09-17 DIAGNOSIS — Z79891 Long term (current) use of opiate analgesic: Secondary | ICD-10-CM | POA: Diagnosis not present

## 2017-09-17 DIAGNOSIS — G894 Chronic pain syndrome: Secondary | ICD-10-CM | POA: Diagnosis not present

## 2017-09-17 DIAGNOSIS — E782 Mixed hyperlipidemia: Secondary | ICD-10-CM | POA: Diagnosis not present

## 2017-09-17 DIAGNOSIS — J449 Chronic obstructive pulmonary disease, unspecified: Secondary | ICD-10-CM | POA: Diagnosis not present

## 2017-09-17 DIAGNOSIS — I10 Essential (primary) hypertension: Secondary | ICD-10-CM | POA: Diagnosis not present

## 2017-10-06 DIAGNOSIS — R252 Cramp and spasm: Secondary | ICD-10-CM | POA: Diagnosis not present

## 2017-10-06 DIAGNOSIS — R600 Localized edema: Secondary | ICD-10-CM | POA: Diagnosis not present

## 2017-10-06 DIAGNOSIS — I2781 Cor pulmonale (chronic): Secondary | ICD-10-CM | POA: Diagnosis not present

## 2017-10-06 DIAGNOSIS — J449 Chronic obstructive pulmonary disease, unspecified: Secondary | ICD-10-CM | POA: Diagnosis not present

## 2017-10-06 DIAGNOSIS — J441 Chronic obstructive pulmonary disease with (acute) exacerbation: Secondary | ICD-10-CM | POA: Diagnosis not present

## 2017-11-05 DIAGNOSIS — J441 Chronic obstructive pulmonary disease with (acute) exacerbation: Secondary | ICD-10-CM | POA: Diagnosis not present

## 2017-11-27 ENCOUNTER — Inpatient Hospital Stay (HOSPITAL_COMMUNITY)
Admission: EM | Admit: 2017-11-27 | Discharge: 2017-11-29 | DRG: 190 | Disposition: A | Discharge status: Home / Self Care | Payer: Medicare Other | Attending: Internal Medicine | Admitting: Internal Medicine

## 2017-11-27 ENCOUNTER — Other Ambulatory Visit: Payer: Self-pay

## 2017-11-27 ENCOUNTER — Encounter (HOSPITAL_COMMUNITY): Payer: Self-pay | Admitting: *Deleted

## 2017-11-27 ENCOUNTER — Emergency Department (HOSPITAL_COMMUNITY): Payer: Medicare Other

## 2017-11-27 DIAGNOSIS — N138 Other obstructive and reflux uropathy: Secondary | ICD-10-CM | POA: Diagnosis present

## 2017-11-27 DIAGNOSIS — F329 Major depressive disorder, single episode, unspecified: Secondary | ICD-10-CM | POA: Diagnosis present

## 2017-11-27 DIAGNOSIS — Z79891 Long term (current) use of opiate analgesic: Secondary | ICD-10-CM

## 2017-11-27 DIAGNOSIS — Z806 Family history of leukemia: Secondary | ICD-10-CM | POA: Diagnosis not present

## 2017-11-27 DIAGNOSIS — E785 Hyperlipidemia, unspecified: Secondary | ICD-10-CM | POA: Diagnosis present

## 2017-11-27 DIAGNOSIS — R000 Tachycardia, unspecified: Secondary | ICD-10-CM | POA: Diagnosis not present

## 2017-11-27 DIAGNOSIS — J439 Emphysema, unspecified: Principal | ICD-10-CM | POA: Diagnosis present

## 2017-11-27 DIAGNOSIS — G894 Chronic pain syndrome: Secondary | ICD-10-CM | POA: Diagnosis not present

## 2017-11-27 DIAGNOSIS — K219 Gastro-esophageal reflux disease without esophagitis: Secondary | ICD-10-CM | POA: Diagnosis not present

## 2017-11-27 DIAGNOSIS — Z7951 Long term (current) use of inhaled steroids: Secondary | ICD-10-CM | POA: Diagnosis not present

## 2017-11-27 DIAGNOSIS — Z7982 Long term (current) use of aspirin: Secondary | ICD-10-CM | POA: Diagnosis not present

## 2017-11-27 DIAGNOSIS — N401 Enlarged prostate with lower urinary tract symptoms: Secondary | ICD-10-CM | POA: Diagnosis present

## 2017-11-27 DIAGNOSIS — R0602 Shortness of breath: Secondary | ICD-10-CM | POA: Diagnosis not present

## 2017-11-27 DIAGNOSIS — Z888 Allergy status to other drugs, medicaments and biological substances status: Secondary | ICD-10-CM

## 2017-11-27 DIAGNOSIS — Z87891 Personal history of nicotine dependence: Secondary | ICD-10-CM

## 2017-11-27 DIAGNOSIS — R0689 Other abnormalities of breathing: Secondary | ICD-10-CM | POA: Diagnosis not present

## 2017-11-27 DIAGNOSIS — R0902 Hypoxemia: Secondary | ICD-10-CM | POA: Diagnosis not present

## 2017-11-27 DIAGNOSIS — N400 Enlarged prostate without lower urinary tract symptoms: Secondary | ICD-10-CM | POA: Diagnosis not present

## 2017-11-27 DIAGNOSIS — J9621 Acute and chronic respiratory failure with hypoxia: Secondary | ICD-10-CM | POA: Diagnosis not present

## 2017-11-27 DIAGNOSIS — J441 Chronic obstructive pulmonary disease with (acute) exacerbation: Secondary | ICD-10-CM | POA: Diagnosis present

## 2017-11-27 DIAGNOSIS — Z9981 Dependence on supplemental oxygen: Secondary | ICD-10-CM | POA: Diagnosis not present

## 2017-11-27 DIAGNOSIS — I10 Essential (primary) hypertension: Secondary | ICD-10-CM | POA: Diagnosis not present

## 2017-11-27 DIAGNOSIS — J9620 Acute and chronic respiratory failure, unspecified whether with hypoxia or hypercapnia: Secondary | ICD-10-CM | POA: Diagnosis present

## 2017-11-27 DIAGNOSIS — I2510 Atherosclerotic heart disease of native coronary artery without angina pectoris: Secondary | ICD-10-CM | POA: Diagnosis not present

## 2017-11-27 DIAGNOSIS — Z79899 Other long term (current) drug therapy: Secondary | ICD-10-CM

## 2017-11-27 DIAGNOSIS — F419 Anxiety disorder, unspecified: Secondary | ICD-10-CM | POA: Diagnosis not present

## 2017-11-27 LAB — BASIC METABOLIC PANEL
Anion gap: 10 (ref 5–15)
BUN: 8 mg/dL (ref 8–23)
CHLORIDE: 102 mmol/L (ref 98–111)
CO2: 28 mmol/L (ref 22–32)
CREATININE: 1.05 mg/dL (ref 0.61–1.24)
Calcium: 9 mg/dL (ref 8.9–10.3)
GFR calc Af Amer: >60 mL/min (ref >60)
GFR calc non Af Amer: >60 mL/min (ref >60)
GLUCOSE: 115 mg/dL — AB (ref 70–99)
Potassium: 3.6 mmol/L (ref 3.5–5.1)
SODIUM: 140 mmol/L (ref 135–145)

## 2017-11-27 LAB — COMPREHENSIVE METABOLIC PANEL
ALK PHOS: 89 U/L (ref 38–126)
ALT: 36 U/L (ref 0–44)
AST: 29 U/L (ref 15–41)
Albumin: 4.5 g/dL (ref 3.5–5.0)
Anion gap: 9 (ref 5–15)
BILIRUBIN TOTAL: 1.3 mg/dL — AB (ref 0.3–1.2)
BUN: 9 mg/dL (ref 8–23)
CALCIUM: 9.1 mg/dL (ref 8.9–10.3)
CO2: 29 mmol/L (ref 22–32)
Chloride: 103 mmol/L (ref 98–111)
Creatinine, Ser: 0.9 mg/dL (ref 0.61–1.24)
GFR calc Af Amer: >60 mL/min (ref >60)
GFR calc non Af Amer: >60 mL/min (ref >60)
Glucose, Bld: 157 mg/dL — ABNORMAL HIGH (ref 70–99)
Potassium: 4 mmol/L (ref 3.5–5.1)
Sodium: 141 mmol/L (ref 135–145)
TOTAL PROTEIN: 7.8 g/dL (ref 6.5–8.1)

## 2017-11-27 LAB — CBC WITH DIFFERENTIAL/PLATELET
Basophils Absolute: 0 10*3/uL (ref 0.0–0.1)
Basophils Relative: 0 %
EOS ABS: 0.1 10*3/uL (ref 0.0–0.7)
EOS PCT: 1 %
HCT: 45.1 % (ref 39.0–52.0)
HEMOGLOBIN: 15.3 g/dL (ref 13.0–17.0)
LYMPHS ABS: 0.8 10*3/uL (ref 0.7–4.0)
Lymphocytes Relative: 6 %
MCH: 30.9 pg (ref 26.0–34.0)
MCHC: 33.9 g/dL (ref 30.0–36.0)
MCV: 91.1 fL (ref 78.0–100.0)
Monocytes Absolute: 0.3 10*3/uL (ref 0.1–1.0)
Monocytes Relative: 2 %
Neutro Abs: 12.2 10*3/uL — ABNORMAL HIGH (ref 1.7–7.7)
Neutrophils Relative %: 91 %
PLATELETS: 278 10*3/uL (ref 150–400)
RBC: 4.95 MIL/uL (ref 4.22–5.81)
RDW: 13.3 % (ref 11.5–15.5)
WBC: 13.3 10*3/uL — ABNORMAL HIGH (ref 4.0–10.5)

## 2017-11-27 LAB — BLOOD GAS, ARTERIAL
ACID-BASE EXCESS: 2 mmol/L (ref 0.0–2.0)
BICARBONATE: 25.2 mmol/L (ref 20.0–28.0)
DELIVERY SYSTEMS: POSITIVE
Drawn by: 270161
EXPIRATORY PAP: 7
FIO2: 40
INSPIRATORY PAP: 14
O2 Saturation: 95.6 %
PATIENT TEMPERATURE: 37
PH ART: 7.346 — AB (ref 7.350–7.450)
RATE: 8 resp/min
pCO2 arterial: 50.8 mmHg — ABNORMAL HIGH (ref 32.0–48.0)
pO2, Arterial: 83 mmHg (ref 83.0–108.0)

## 2017-11-27 LAB — GLUCOSE, CAPILLARY: Glucose-Capillary: 133 mg/dL — ABNORMAL HIGH (ref 70–99)

## 2017-11-27 LAB — CBC
HEMATOCRIT: 43.8 % (ref 39.0–52.0)
HEMOGLOBIN: 14.8 g/dL (ref 13.0–17.0)
MCH: 31.2 pg (ref 26.0–34.0)
MCHC: 33.8 g/dL (ref 30.0–36.0)
MCV: 92.4 fL (ref 78.0–100.0)
Platelets: 273 10*3/uL (ref 150–400)
RBC: 4.74 MIL/uL (ref 4.22–5.81)
RDW: 13.3 % (ref 11.5–15.5)
WBC: 13.1 10*3/uL — ABNORMAL HIGH (ref 4.0–10.5)

## 2017-11-27 LAB — MRSA PCR SCREENING: MRSA by PCR: NEGATIVE

## 2017-11-27 LAB — PHOSPHORUS: Phosphorus: 3.5 mg/dL (ref 2.5–4.6)

## 2017-11-27 LAB — TROPONIN I

## 2017-11-27 LAB — MAGNESIUM: Magnesium: 2.5 mg/dL — ABNORMAL HIGH (ref 1.7–2.4)

## 2017-11-27 MED ORDER — OXYCODONE-ACETAMINOPHEN 7.5-325 MG PO TABS: 1.0000 | ORAL_TABLET | Freq: Four times a day (QID) | ORAL | Status: DC | PRN

## 2017-11-27 MED ORDER — FUROSEMIDE 20 MG PO TABS
20.0000 mg | ORAL_TABLET | Freq: Every day | ORAL | Status: DC
  Administered 2017-11-28 – 2017-11-29 (×2): 20 mg via ORAL
  Filled 2017-11-27 (×2): qty 1

## 2017-11-27 MED ORDER — IPRATROPIUM BROMIDE 0.02 % IN SOLN
0.5000 mg | Freq: Three times a day (TID) | RESPIRATORY_TRACT | Status: DC
  Administered 2017-11-27 – 2017-11-29 (×6): 0.5 mg via RESPIRATORY_TRACT
  Filled 2017-11-27 (×6): qty 2.5

## 2017-11-27 MED ORDER — DIAZEPAM 5 MG PO TABS
5.0000 mg | ORAL_TABLET | Freq: Three times a day (TID) | ORAL | Status: DC | PRN
  Administered 2017-11-28: 5 mg via ORAL
  Filled 2017-11-27: qty 1

## 2017-11-27 MED ORDER — DULOXETINE HCL 20 MG PO CPEP
20.0000 mg | ORAL_CAPSULE | Freq: Every day | ORAL | Status: DC
  Administered 2017-11-28: 20 mg via ORAL
  Filled 2017-11-27 (×3): qty 1

## 2017-11-27 MED ORDER — AMLODIPINE BESYLATE 5 MG PO TABS
10.0000 mg | ORAL_TABLET | Freq: Every day | ORAL | Status: DC
  Administered 2017-11-28 – 2017-11-29 (×2): 10 mg via ORAL
  Filled 2017-11-27 (×2): qty 2

## 2017-11-27 MED ORDER — SODIUM CHLORIDE 0.9 % IV BOLUS
500.0000 mL | Freq: Once | INTRAVENOUS | Status: AC
  Administered 2017-11-27: 500 mL via INTRAVENOUS

## 2017-11-27 MED ORDER — SODIUM CHLORIDE 0.9% FLUSH
3.0000 mL | Freq: Two times a day (BID) | INTRAVENOUS | Status: DC
  Administered 2017-11-28 – 2017-11-29 (×4): 3 mL via INTRAVENOUS

## 2017-11-27 MED ORDER — ALBUTEROL SULFATE (2.5 MG/3ML) 0.083% IN NEBU
2.5000 mg | INHALATION_SOLUTION | RESPIRATORY_TRACT | Status: DC | PRN
Start: 2017-11-27 — End: 2017-11-29

## 2017-11-27 MED ORDER — BUDESONIDE 0.5 MG/2ML IN SUSP
0.5000 mg | Freq: Two times a day (BID) | RESPIRATORY_TRACT | Status: DC
  Administered 2017-11-27 – 2017-11-29 (×4): 0.5 mg via RESPIRATORY_TRACT
  Filled 2017-11-27 (×11): qty 2

## 2017-11-27 MED ORDER — HEPARIN SODIUM (PORCINE) 5000 UNIT/ML IJ SOLN
5000.0000 [IU] | Freq: Three times a day (TID) | INTRAMUSCULAR | Status: DC
  Administered 2017-11-28 – 2017-11-29 (×5): 5000 [IU] via SUBCUTANEOUS
  Filled 2017-11-27 (×5): qty 1

## 2017-11-27 MED ORDER — DOXYCYCLINE HYCLATE 100 MG PO TABS
100.0000 mg | ORAL_TABLET | Freq: Two times a day (BID) | ORAL | Status: DC
  Administered 2017-11-28 – 2017-11-29 (×4): 100 mg via ORAL
  Filled 2017-11-27 (×4): qty 1

## 2017-11-27 MED ORDER — MAGNESIUM SULFATE 2 GM/50ML IV SOLN
2.0000 g | Freq: Once | INTRAVENOUS | Status: AC
  Administered 2017-11-27: 2 g via INTRAVENOUS
  Filled 2017-11-27: qty 50

## 2017-11-27 MED ORDER — ADULT MULTIVITAMIN W/MINERALS CH
1.0000 | ORAL_TABLET | Freq: Every day | ORAL | Status: DC
  Administered 2017-11-28 – 2017-11-29 (×2): 1 via ORAL
  Filled 2017-11-27 (×2): qty 1

## 2017-11-27 MED ORDER — SIMVASTATIN 20 MG PO TABS
20.0000 mg | ORAL_TABLET | Freq: Every day | ORAL | Status: DC
  Administered 2017-11-28: 20 mg via ORAL
  Filled 2017-11-27: qty 1

## 2017-11-27 MED ORDER — ARFORMOTEROL TARTRATE 15 MCG/2ML IN NEBU
15.0000 ug | INHALATION_SOLUTION | Freq: Two times a day (BID) | RESPIRATORY_TRACT | Status: DC
  Administered 2017-11-27 – 2017-11-29 (×4): 15 ug via RESPIRATORY_TRACT
  Filled 2017-11-27 (×4): qty 2

## 2017-11-27 MED ORDER — LACTULOSE 10 GM/15ML PO SOLN
10.0000 g | Freq: Three times a day (TID) | ORAL | Status: DC
  Administered 2017-11-28 (×2): 10 g via ORAL
  Filled 2017-11-27 (×4): qty 30

## 2017-11-27 MED ORDER — FLUDROCORTISONE ACETATE 0.1 MG PO TABS
0.1000 mg | ORAL_TABLET | Freq: Every day | ORAL | Status: DC
  Administered 2017-11-28 – 2017-11-29 (×2): 0.1 mg via ORAL
  Filled 2017-11-27 (×3): qty 1

## 2017-11-27 MED ORDER — SODIUM CHLORIDE 0.9% FLUSH: 3.0000 mL | INTRAVENOUS | Status: DC | PRN

## 2017-11-27 MED ORDER — FINASTERIDE 5 MG PO TABS
5.0000 mg | ORAL_TABLET | Freq: Every day | ORAL | Status: DC
  Administered 2017-11-28 – 2017-11-29 (×2): 5 mg via ORAL
  Filled 2017-11-27 (×3): qty 1

## 2017-11-27 MED ORDER — PANTOPRAZOLE SODIUM 40 MG PO TBEC
40.0000 mg | DELAYED_RELEASE_TABLET | Freq: Every day | ORAL | Status: DC
  Administered 2017-11-28 – 2017-11-29 (×2): 40 mg via ORAL
  Filled 2017-11-27 (×2): qty 1

## 2017-11-27 MED ORDER — POLYVINYL ALCOHOL 1.4 % OP SOLN: 1.0000 [drp] | Freq: Two times a day (BID) | OPHTHALMIC | Status: DC | PRN

## 2017-11-27 MED ORDER — METHYLPREDNISOLONE SODIUM SUCC 125 MG IJ SOLR
125.0000 mg | Freq: Once | INTRAMUSCULAR | Status: AC
  Administered 2017-11-27: 125 mg via INTRAVENOUS
  Filled 2017-11-27: qty 2

## 2017-11-27 MED ORDER — ASPIRIN EC 81 MG PO TBEC
162.0000 mg | DELAYED_RELEASE_TABLET | Freq: Every day | ORAL | Status: DC
  Administered 2017-11-28 – 2017-11-29 (×2): 162 mg via ORAL
  Filled 2017-11-27 (×2): qty 2

## 2017-11-27 MED ORDER — SODIUM CHLORIDE 0.9 % IV SOLN: 250.0000 mL | INTRAVENOUS | Status: DC | PRN

## 2017-11-27 MED ORDER — ONDANSETRON HCL 4 MG PO TABS
8.0000 mg | ORAL_TABLET | Freq: Every day | ORAL | Status: DC
  Administered 2017-11-28 – 2017-11-29 (×2): 8 mg via ORAL
  Filled 2017-11-27 (×2): qty 2

## 2017-11-27 MED ORDER — HEPARIN SODIUM (PORCINE) 5000 UNIT/ML IJ SOLN: 5000.0000 [IU] | Freq: Three times a day (TID) | INTRAMUSCULAR | Status: DC

## 2017-11-27 MED ORDER — TAMSULOSIN HCL 0.4 MG PO CAPS
0.4000 mg | ORAL_CAPSULE | Freq: Every day | ORAL | Status: DC
  Administered 2017-11-28 – 2017-11-29 (×2): 0.4 mg via ORAL
  Filled 2017-11-27 (×2): qty 1

## 2017-11-27 MED ORDER — METHYLPREDNISOLONE SODIUM SUCC 125 MG IJ SOLR
60.0000 mg | Freq: Three times a day (TID) | INTRAMUSCULAR | Status: DC
  Administered 2017-11-28 – 2017-11-29 (×5): 60 mg via INTRAVENOUS
  Filled 2017-11-27 (×5): qty 2

## 2017-11-27 NOTE — ED Provider Notes (Signed)
[facility] EMERGENCY DEPARTMENT Provider Note   CSN: [id number] Arrival date & time: 11/27/17  1033     History   Chief Complaint Chief Complaint  Patient presents with  . Shortness of Breath    HPI [name] is a 67 y.o. male.  Level 5 caveat for urgent need for intervention.  Patient has a known history of COPD.  He is on 4 L of nasal cannula oxygen on a regular basis.  He presents with worsening dyspnea over the past 24 hours.  He has tried his nebulizer machine at home with minimal success.  EMS was notified and he was transported to the emergency department.  No productive sputum, fever, sweats, chills.  Severity of symptoms is severe.     Past Medical History:  Diagnosis Date  . Anxiety   . CAD (coronary artery disease)   . Chronic pain   . 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 Dyslipidemia   . History of tobacco abuse    80 pack year history   . Hypertension   . On home O2    4L N/C  . Opioid dependence (HCC)   . Respiratory failure [facility])     Patient Active Problem List   Diagnosis Date Noted  . Cough with hemoptysis 11/13/2016  . 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 Vitamin D insufficiency 04/01/2016  . Elevated C-reactive protein (CRP) 03/28/2016  . Chronic pain syndrome 03/26/2016  . Chronic bilateral low back pain without sciatica (R>L) 03/26/2016  . Long term current use of opiate analgesic 03/26/2016  . Long term prescription opiate use 03/26/2016  . Encounter for therapeutic drug level monitoring 03/26/2016  . Encounter for pain management planning 03/26/2016  . Chronic sacroiliac joint pain (B) (R>L) 03/26/2016  . Chronic hip pain, left 03/26/2016  . Chronic right hip pain 03/26/2016  . Chronic hip pain (B) (L>R) 03/26/2016  . Chronic left sacroiliac pain 03/26/2016  . Chronic right sacroiliac pain 03/26/2016  . HCAP (healthcare-associated pneumonia) 01/08/2016  . PNA (pneumonia) 12/25/2015  . Chronic respiratory  failure with hypoxia (HCC) 12/25/2015  . COPD exacerbation (HCC)   . Sepsis (HCC) 07/03/2015  . CAP (community acquired pneumonia) 07/03/2015  . Acute on chronic respiratory failure with hypoxia (HCC) 07/03/2015  . GERD (gastroesophageal reflux disease) 07/03/2015  . Coronary artery disease 10/13/2013  . Anxiety 10/13/2013  . Dyslipidemia 06/23/2013  . COPD (chronic obstructive pulmonary disease) (HCC) 06/23/2013  . SHOULDER PAIN 08/16/2008  . SHOULDER IMPINGEMENT SYNDROME, LEFT 08/16/2008    Past Surgical History:  Procedure Laterality Date  . CARDIAC CATHETERIZATION  08/10/2008   right & left - normal coronaries (Dr. Claudia Desanctis)  . NM MYOCAR PERF WALL MOTION  07/2008   bruce myoview - mild ischemia in basal inferoseptal & mid inferoseptal regions; EF 63%  . TRANSTHORACIC ECHOCARDIOGRAM  07/2008   EF=>55%; RV mildly dilated; RA mild-mod dilated; mild mitral annular calcification & mild MR; AV mildly sclerotic        Home Medications    Prior to Admission medications   Medication Sig Start Date End Date Taking? Authorizing Provider  albuterol (PROVENTIL HFA;VENTOLIN HFA) 108 (90 BASE) MCG/ACT inhaler Inhale 2 puffs into the lungs every 6 (six) hours as needed for wheezing or shortness of breath.   Yes [provider]  albuterol (PROVENTIL) (2.5 MG/3ML) 0.083% nebulizer solution Take 2.5 mg by nebulization every 4 (four) hours as needed for wheezing or shortness of breath.  Yes [provider]  amLODipine (NORVASC) 5 MG tablet Take 10 mg by mouth daily.  10/14/16  Yes [provider]  aspirin EC 81 MG tablet Take 162 mg by mouth 3 (three) times daily.    Yes [provider]  cephALEXin (KEFLEX) 500 MG capsule Take 1 capsule (500 mg total) by mouth 4 (four) times daily. 06/30/17  Yes Ivery QualeBryant, Hobson, PA-C  diazepam (VALIUM) 10 MG tablet Take 10 mg by mouth 3 (three) times daily. Reported on 07/15/2015   Yes [provider]  DULoxetine  (CYMBALTA) 20 MG capsule Take 20 mg by mouth at bedtime.    Yes [provider]  finasteride (PROSCAR) 5 MG tablet Take 1 tablet by mouth daily. 01/14/17  Yes [provider]  fludrocortisone (FLORINEF) 0.1 MG tablet Take 0.1 mg by mouth daily.  11/01/16  Yes [provider]  furosemide (LASIX) 20 MG tablet Take 1 tablet by mouth daily. 11/17/17  Yes [provider]  lactulose (CHRONULAC) 10 GM/15ML solution 10 g 4 (four) times daily.  02/11/16  Yes [provider]  Multiple Vitamin (MULTIVITAMIN WITH MINERALS) TABS tablet Take 1 tablet by mouth daily.   Yes [provider]  omeprazole (PRILOSEC) 40 MG capsule Take 40 mg by mouth daily.  01/10/13  Yes [provider]  oxyCODONE-acetaminophen (PERCOCET) 7.5-325 MG per tablet Take 1 tablet by mouth every 4 (four) hours as needed for pain.   Yes [provider]  OXYGEN Inhale 4 L into the lungs continuous.   Yes [provider]  simvastatin (ZOCOR) 20 MG tablet TAKE 1 TABLET BY MOUTH DAILY AT 6 PM. 09/21/15  Yes Laqueta LindenKoneswaran, Suresh A, MD  SYMBICORT 160-4.5 MCG/ACT inhaler Inhale 2 puffs into the lungs 2 (two) times daily. 12/21/15  Yes [provider]  tamsulosin (FLOMAX) 0.4 MG CAPS capsule Take 1 capsule by mouth daily. 01/14/17  Yes [provider]  tiZANidine (ZANAFLEX) 4 MG tablet Take 1 tablet by mouth daily as needed for muscle spasms. 01/03/17  Yes [provider]  ampicillin (PRINCIPEN) 500 MG capsule Take 1 capsule (500 mg total) by mouth 3 (three) times daily. Take with food Patient not taking: Reported on 11/27/2017 06/30/17   Ivery QualeBryant, Hobson, PA-C  levofloxacin (LEVAQUIN) 500 MG tablet Take 1 tablet (500 mg total) by mouth daily. Patient not taking: Reported on 06/30/2017 01/19/17   Rolland PorterJames, Mark, MD  ondansetron (ZOFRAN) 8 MG tablet Take 1 tablet by mouth daily. 11/21/17   [provider]  Propylene Glycol (SYSTANE BALANCE) 0.6 % SOLN Place 1  drop into both eyes 2 (two) times daily as needed (dry eyes).    [provider]    Family History Family History  Problem Relation Age of Onset  . Leukemia Mother   . Suicidality Father     Social History Social History   Tobacco Use  . Smoking status: Former Smoker    Start date: 04/21/1974    Last attempt to quit: 06/22/2007    Years since quitting: 10.4  . Smokeless tobacco: Never Used  Substance Use Topics  . Alcohol use: No    Alcohol/week: 0.0 standard drinks  . Drug use: No     Allergies   Ativan [lorazepam]   Review of Systems Review of Systems  Unable to perform ROS: Acuity of condition     Physical Exam Updated Vital Signs BP (!) 157/95   Pulse (!) 136   Temp 98.7 F (37.1 C) (Oral)  Resp (!) 22   Ht 5\' 9"  (1.753 m)   Wt 88.5 kg   SpO2 96%   BMI 28.80 kg/m   Physical Exam  Constitutional: He is oriented to person, place, and time.  Tachypneic, using accessory muscles, tachycardic  HENT:  Head: Normocephalic and atraumatic.  Eyes: Conjunctivae are normal.  Neck: Neck supple.  Cardiovascular: Regular rhythm.  Tachycardic  Pulmonary/Chest: Accessory muscle usage present. He has decreased breath sounds.  Abdominal: Soft. Bowel sounds are normal.  Musculoskeletal: Normal range of motion.  Neurological: He is alert and oriented to person, place, and time.  Skin: Skin is warm and dry.  Psychiatric: He has a normal mood and affect. His behavior is normal.  Nursing note and vitals reviewed.    ED Treatments / Results  Labs (all labs ordered are listed, but only abnormal results are displayed) Labs Reviewed  CBC WITH DIFFERENTIAL/PLATELET - Abnormal; Notable for the following components:      Result Value   WBC 13.3 (*)    Neutro Abs 12.2 (*)    All other components within normal limits  BASIC METABOLIC PANEL - Abnormal; Notable for the following components:   Glucose, Bld 115 (*)    All other components within normal limits    TROPONIN I    EKG None  Radiology Dg Chest Port 1 View  Result Date: 11/27/2017 CLINICAL DATA:  Shortness of breath. EXAM: PORTABLE CHEST 1 VIEW COMPARISON:  Radiographs of November 11, 2016. FINDINGS: Stable cardiomediastinal silhouette. No pneumothorax or pleural effusion is noted. Both lungs are clear. The visualized skeletal structures are unremarkable. IMPRESSION: No acute cardiopulmonary abnormality seen. Electronically Signed   By: Lupita Raider, M.D.   On: 11/27/2017 12:04    Procedures Procedures (including critical care time)  Medications Ordered in ED Medications  sodium chloride 0.9 % bolus 500 mL (500 mLs Intravenous New Bag/Given 11/27/17 1213)  magnesium sulfate IVPB 2 g 50 mL (has no administration in time range)  methylPREDNISolone sodium succinate (SOLU-MEDROL) 125 mg/2 mL injection 125 mg (125 mg Intravenous Given 11/27/17 1213)     Initial Impression / Assessment and Plan / ED Course  I have reviewed the triage vital signs and the nursing notes.  Pertinent labs & imaging results that were available during my care of the patient were reviewed by me and considered in my medical decision making (see chart for details).     Patient presents with profound COPD exacerbation.  Chest x-ray shows no obvious pneumonia.  He has been given nebulizer treatments, IV steroids, IV magnesium.  He remains tachycardic secondary to the beta agonist.  BiPAP was initiated.  Admit to general medicine.   CRITICAL CARE Performed by: Donnetta Hutching Total critical care time: 30 minutes Critical care time was exclusive of separately billable procedures and treating other patients. Critical care was necessary to treat or prevent imminent or life-threatening deterioration. Critical care was time spent personally by me on the following activities: development of treatment plan with patient and/or surrogate as well as nursing, discussions with consultants, evaluation of patient's response to  treatment, examination of patient, obtaining history from patient or surrogate, ordering and performing treatments and interventions, ordering and review of laboratory studies, ordering and review of radiographic studies, pulse oximetry and re-evaluation of patient's condition.  Final Clinical Impressions(s) / ED Diagnoses   Final diagnoses:  COPD exacerbation [facility])    ED Discharge Orders    None       Donnetta Hutching, MD 11/27/17  1248  

## 2017-11-27 NOTE — Progress Notes (Signed)
Called for a breathing treatment. Entered room and found patient to be tachycardic and slightly tachypneic. Pt was on a 100% NRB mask and using accessory muscles.  Spoke with Dr Adriana Simasook. He wanted Bipap initiated. Started patient on 14/7 and 40%. He is tolerating therapy well and SPO2 is maintaining.

## 2017-11-27 NOTE — Progress Notes (Signed)
Transported pt from ED to ICU 4. Removed patient from Bipap and placed him on 4lpm Lane to see how he tolerated it. He had increased WOB after ab . He was placed back on Bipap at this time and tolerating well.

## 2017-11-27 NOTE — ED Triage Notes (Signed)
Started having increase SOB yesterday, started home nebs without any improvement, called EMS when symptom worsened.

## 2017-11-27 NOTE — H&P (Signed)
History and Physical    [name] ZOX:[id number] DOB: 1950/06/18 DOA: 11/27/2017  Referring MD/NP/PA: Dr. Adriana Simas PCP: Elfredia Nevins, MD  Patient coming from: home   Chief Complaint: worsening SOB and wheezing.  HPI: [name] is a 67 y.o. male 67 y/o with PMH significant for chronic pain, HTN, COPD ( with chronic resp failure), anxiety and dyslipidemia; who presented to ED with complaints of worsening SOB. Patient reported symptoms started approx 24-48 hours prior to admission and failed to improved with his home nebulizer. He expressed some non productive cough and increase wheezing. No fever, no CP, no nausea, no vomiting, no abd pain, no hematemesis, dysuria, hematuria, melena or any other complaints.  In ED patient CXR demonstrated no acute infiltrates; he was requiring over 6L with increase tachypnea and using accessory muscles mildly. He received IV magnesium, solumedrol and was placed on BIPAP.  Past Medical/Surgical History: Past Medical History:  Diagnosis Date  . Anxiety   . CAD (coronary artery disease)   . Chronic pain   . 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 Dyslipidemia   . History of tobacco abuse    80 pack year history   . Hypertension   . On home O2    4L N/C  . Opioid dependence (HCC)   . Respiratory failure [facility])     Past Surgical History:  Procedure Laterality Date  . CARDIAC CATHETERIZATION  08/10/2008   right & left - normal coronaries (Dr. Claudia Desanctis)  . NM MYOCAR PERF WALL MOTION  07/2008   bruce myoview - mild ischemia in basal inferoseptal & mid inferoseptal regions; EF 63%  . TRANSTHORACIC ECHOCARDIOGRAM  07/2008   EF=>55%; RV mildly dilated; RA mild-mod dilated; mild mitral annular calcification & mild MR; AV mildly sclerotic    Social History:  reports that he quit smoking about 10 years ago. He started smoking about 43 years ago. He has never used smokeless tobacco. He reports that he does not drink alcohol or  use drugs.  Allergies: Allergies  Allergen Reactions  . Ativan [Lorazepam] Other (See Comments)    Dry mouth sinus and breathing problems     Family History:  Family History  Problem Relation Age of Onset  . Leukemia Mother   . Suicidality Father     Prior to Admission medications   Medication Sig Start Date End Date Taking? Authorizing Provider  albuterol (PROVENTIL HFA;VENTOLIN HFA) 108 (90 BASE) MCG/ACT inhaler Inhale 2 puffs into the lungs every 6 (six) hours as needed for wheezing or shortness of breath.   Yes [provider]  albuterol (PROVENTIL) (2.5 MG/3ML) 0.083% nebulizer solution Take 2.5 mg by nebulization every 4 (four) hours as needed for wheezing or shortness of breath.   Yes [provider]  amLODipine (NORVASC) 5 MG tablet Take 10 mg by mouth daily.  10/14/16  Yes [provider]  aspirin EC 81 MG tablet Take 162 mg by mouth 3 (three) times daily.    Yes [provider]  cephALEXin (KEFLEX) 500 MG capsule Take 1 capsule (500 mg total) by mouth 4 (four) times daily. 06/30/17  Yes Ivery Quale, PA-C  diazepam (VALIUM) 10 MG tablet Take 10 mg by mouth 3 (three) times daily. Reported on 07/15/2015   Yes [provider]  DULoxetine (CYMBALTA) 20 MG capsule Take 20 mg by mouth at bedtime.    Yes [provider]  finasteride (PROSCAR) 5 MG tablet Take 1 tablet by  mouth daily. 01/14/17  Yes [provider]  fludrocortisone (FLORINEF) 0.1 MG tablet Take 0.1 mg by mouth daily.  11/01/16  Yes [provider]  furosemide (LASIX) 20 MG tablet Take 1 tablet by mouth daily. 11/17/17  Yes [provider]  lactulose (CHRONULAC) 10 GM/15ML solution 10 g 4 (four) times daily.  02/11/16  Yes [provider]  Multiple Vitamin (MULTIVITAMIN WITH MINERALS) TABS tablet Take 1 tablet by mouth daily.   Yes [provider]  omeprazole (PRILOSEC) 40 MG capsule Take 40 mg by mouth daily.  01/10/13  Yes  [provider]  oxyCODONE-acetaminophen (PERCOCET) 7.5-325 MG per tablet Take 1 tablet by mouth every 4 (four) hours as needed for pain.   Yes [provider]  OXYGEN Inhale 4 L into the lungs continuous.   Yes [provider]  simvastatin (ZOCOR) 20 MG tablet TAKE 1 TABLET BY MOUTH DAILY AT 6 PM. 09/21/15  Yes Laqueta LindenKoneswaran, Suresh A, MD  SYMBICORT 160-4.5 MCG/ACT inhaler Inhale 2 puffs into the lungs 2 (two) times daily. 12/21/15  Yes [provider]  tamsulosin (FLOMAX) 0.4 MG CAPS capsule Take 1 capsule by mouth daily. 01/14/17  Yes [provider]  tiZANidine (ZANAFLEX) 4 MG tablet Take 1 tablet by mouth daily as needed for muscle spasms. 01/03/17  Yes [provider]  ondansetron (ZOFRAN) 8 MG tablet Take 1 tablet by mouth daily. 11/21/17   [provider]  Propylene Glycol (SYSTANE BALANCE) 0.6 % SOLN Place 1 drop into both eyes 2 (two) times daily as needed (dry eyes).    [provider]    Review of Systems:  -negative except as otherwise mentioned on HPI   Physical Exam: Vitals:   11/27/17 1400 11/27/17 1512 11/27/17 1600 11/27/17 1700  BP: 129/86 (!) 157/80  (!) 143/91  Pulse: (!) 126 (!) 128 (!) 126 (!) 117  Resp:   18 (!) 21  Temp:  98.1 F (36.7 C)    TempSrc:  Axillary    SpO2: 96% 98% 96% 97%  Weight:  87.8 kg    Height:  5\' 9"  (1.753 m)     Constitutional: in mild resp distress; unable to speak in full sentences and wearing BIPAP. Patient is afebrile. Eyes: PERRL, lids and conjunctivae normal, no icterus ENMT: Mucous membranes are moist. Posterior pharynx clear of any exudate or lesions.No trush    Neck: normal, supple, no masses, no thyromegaly, no JVD. Respiratory: positive tachypnea, poor air movement, and mildly using accessory muscles. Cardiovascular: Regular rhythm, posiitve tachycrdis, no murmurs / rubs / gallops. 2+ pedal pulses. No carotid bruits.  Abdomen: no tenderness, no masses palpated. No  hepatosplenomegaly. Bowel sounds positive.  Musculoskeletal: no clubbing / cyanosis. No joint deformity upper and lower extremities. Good ROM, no contractures. Normal muscle tone. Positive 1-2 plus edema bilaterally. Skin: no rashes, lesions, ulcers. No induration Neurologic: CN 2-12 grossly intact. Sensation intact, DTR normal. Strength 4/5 in all 4 limbs due to poor effort.Marland Kitchen.  Psychiatric: Normal judgment and insight. Alert and oriented x 3. Normal mood.    Labs on Admission: I have personally reviewed the following labs and imaging studies  CBC: Recent Labs  Lab 11/27/17 1136  WBC 13.3*  NEUTROABS 12.2*  HGB 15.3  HCT 45.1  MCV 91.1  PLT 278   Basic Metabolic Panel: Recent Labs  Lab 11/27/17 1136  NA 140  K 3.6  CL 102  CO2 28  GLUCOSE 115*  BUN 8  CREATININE 1.05  CALCIUM 9.0   GFR: Estimated Creatinine Clearance: 74.8 mL/min (by C-G formula based on SCr of 1.05 mg/dL).  Cardiac Enzymes: Recent Labs  Lab 11/27/17 1136  TROPONINI <0.03    Urine analysis:    Component Value Date/Time   COLORURINE AMBER (A) 06/30/2017 0810   APPEARANCEUR CLOUDY (A) 06/30/2017 0810   LABSPEC 1.015 06/30/2017 0810   PHURINE 7.0 06/30/2017 0810   GLUCOSEU NEGATIVE 06/30/2017 0810   HGBUR LARGE (A) 06/30/2017 0810   BILIRUBINUR NEGATIVE 06/30/2017 0810   KETONESUR NEGATIVE 06/30/2017 0810   PROTEINUR 100 (A) 06/30/2017 0810   NITRITE NEGATIVE 06/30/2017 0810   LEUKOCYTESUR LARGE (A) 06/30/2017 0810    Radiological Exams on Admission: Dg Chest Port 1 View  Result Date: 11/27/2017 CLINICAL DATA:  Shortness of breath. EXAM: PORTABLE CHEST 1 VIEW COMPARISON:  Radiographs of November 11, 2016. FINDINGS: Stable cardiomediastinal silhouette. No pneumothorax or pleural effusion is noted. Both lungs are clear. The visualized skeletal structures are unremarkable. IMPRESSION: No acute cardiopulmonary abnormality seen. Electronically Signed   By: Lupita Raider, M.D.   On: 11/27/2017 12:04      EKG: none   Assessment/Plan 1-Acute on chronic respiratory failure (HCC): in the setting of COPD exacerbation. -patient with hypoxia and increase use of accessory muscles -will admit to stepdown -place on BIPAP and started on IV steroids, pulmicort and doxycycline  -once off BIPAP will use flutter valve and IS -started on atrovent and brovana. -follow clinical response and weaned down to home baseline usage of oxygen supplementation (patient on 4L regularly)    2-Anxiety/depression -no SI or hallucinations -will resume home anxiolytics and antidepressant drugs. (valium PRN and cymbalta)  3-GERD (gastroesophageal reflux disease) -resume PPI  4-Chronic pain syndrome -resume use of percocet as per home analgesics regimen  5-BPH (benign prostatic hyperplasia) -no complaints of urinary retention -will resume the use of Proscar and flomax  6-HLD -continue statins   7-HTN -will use amlodipine and lasix  -heart healthy diet would be order when able to tolerate PO's and off BIPAP.    DVT prophylaxis: heparin Code Status: Full Family Communication: no family at bedside. Disposition Plan: home once breathing improved/stabilized. Consults called: none. Admission status: stepdown, LOS > 2 midnights, inpatient.   Time Spent: 70 minutes  Vassie Loll MD [facility] Pager [phone]  If 7PM-7AM, please contact night-coverage [email or web address] Password [facility]  11/27/2017, 6:22 PM

## 2017-11-28 LAB — BASIC METABOLIC PANEL
Anion gap: 9 (ref 5–15)
BUN: 13 mg/dL (ref 8–23)
CO2: 28 mmol/L (ref 22–32)
CREATININE: 0.79 mg/dL (ref 0.61–1.24)
Calcium: 9.1 mg/dL (ref 8.9–10.3)
Chloride: 105 mmol/L (ref 98–111)
GFR calc non Af Amer: >60 mL/min (ref >60)
GLUCOSE: 126 mg/dL — AB (ref 70–99)
Potassium: 3.8 mmol/L (ref 3.5–5.1)
Sodium: 142 mmol/L (ref 135–145)

## 2017-11-28 LAB — CBC
HCT: 42.4 % (ref 39.0–52.0)
Hemoglobin: 14.2 g/dL (ref 13.0–17.0)
MCH: 30.8 pg (ref 26.0–34.0)
MCHC: 33.5 g/dL (ref 30.0–36.0)
MCV: 92 fL (ref 78.0–100.0)
PLATELETS: 274 10*3/uL (ref 150–400)
RBC: 4.61 MIL/uL (ref 4.22–5.81)
RDW: 13.2 % (ref 11.5–15.5)
WBC: 10.1 10*3/uL (ref 4.0–10.5)

## 2017-11-28 MED ORDER — GUAIFENESIN-DM 100-10 MG/5ML PO SYRP
5.0000 mL | ORAL_SOLUTION | ORAL | Status: DC | PRN
  Administered 2017-11-28: 5 mL via ORAL
  Filled 2017-11-28: qty 5

## 2017-11-28 NOTE — Progress Notes (Signed)
Patient is doing so well with out BiPAP will tr leave off for night. Transition to dream station BiPAP IF NEEDED.

## 2017-11-28 NOTE — Progress Notes (Signed)
PROGRESS NOTE    [name]  UVO:[id number] DOB: 1951-04-07 DOA: 11/27/2017 PCP: Elfredia Nevins, MD     Brief Narrative:  67 y/o with PMH significant for chronic pain, HTN, COPD ( with chronic resp failure), anxiety and dyslipidemia; who presented to ED with complaints of worsening SOB. Patient reported symptoms started approx 24-48 hours prior to admission and failed to improved with his home nebulizer. He expressed some non productive cough and increase wheezing.  Assessment & Plan: 1-acute on chronic respiratory failure in the setting of COPD exacerbation -Patient with significant improvement after 14-hour use of BiPAP. -He is now tolerating 5-6L [place] supplementation; no using accessory muscles and maintaining good oxygen saturation. -Improved air movement bilaterally and feeling better. -Will continue IV steroids, Pulmicort, doxycycline, Atrovent and Brovana. -Start flutter valve and follow clinical response. -Given the stability his diet will be advanced and the patient will be transferred to MedSurg bed.  2-anxiety/depression -No suicidal ideation or hallucinations. -Continue as needed Valium and continue Cymbalta  3-gastroesophageal reflux disease without esophagitis -Continue PPI.  4-chronic pain syndrome -Patient reports stable at this time -Continue the use of  PRN Percocet as per home analgesic regimen.  5-BPH -No complaint of retention -Will continue to use of Proscar and Flomax.  6-hyperlipidemia -Continue statins.  7-HTN -Continue the use of amlodipine and Lasix -blood pressure is a stable and well-controlled. -Start heart healthy diet  DVT prophylaxis: Heparin Code Status: Full code Family Communication: No family at bedside. Disposition Plan: We will advance diet, moved to MedSurg, continue treatment for his COPD exacerbation.  Hopefully home in the next 24-48 hours.  Consultants:   None  Procedures:   See below for x-ray  reports.  Antimicrobials:  Anti-infectives (From admission, onward)   Start     Dose/Rate Route Frequency Ordered Stop   11/27/17 2000  doxycycline (VIBRA-TABS) tablet 100 mg     100 mg Oral Every 12 hours 11/27/17 1821 12/02/17 2159      Subjective: Feeling much better and currently off BiPAP for over 12 hours; denies chest pain, he is very hungry and would like something to eat.  No nausea, no vomiting, almost able to speak in full sentences.  Objective: Vitals:   11/28/17 0829 11/28/17 0900 11/28/17 1000 11/28/17 1504  BP:  (!) 138/100 118/73   Pulse:  (!) 113 96   Resp:  15 17   Temp:      TempSrc:      SpO2: 96% 95% 96% 95%  Weight:      Height:       No intake or output data in the 24 hours ending 11/28/17 1735 Filed Weights   11/27/17 1043 11/27/17 1512  Weight: 88.5 kg 87.8 kg    Examination: General exam: Alert, awake, oriented x 3; denies chest pain and express significant improvement in his breathing overall. Respiratory system: Improved air movement, positive rhonchi, positive and expiratory wheezing, no using accessory muscles. Cardiovascular system:RRR. No murmurs, rubs, gallops. Gastrointestinal system: Abdomen is nondistended, soft and nontender. No organomegaly or masses felt. Normal bowel sounds heard. Central nervous system: Alert and oriented. No focal neurological deficits. Extremities: No cyanosis or clubbing, positive trace edema bilaterally. Skin: No rashes, lesions or ulcers Psychiatry: Judgement and insight appear normal. Mood & affect appropriate.   Data Reviewed: I have personally reviewed following labs and imaging studies  CBC: Recent Labs  Lab 11/27/17 1136 11/27/17 1853 11/28/17 0451  WBC 13.3* 13.1* 10.1  NEUTROABS 12.2*  --   --  HGB 15.3 14.8 14.2  HCT 45.1 43.8 42.4  MCV 91.1 92.4 92.0  PLT 278 273 274   Basic Metabolic Panel: Recent Labs  Lab 11/27/17 1136 11/27/17 1853 11/28/17 0451  NA 140 141 142  K 3.6 4.0 3.8   CL 102 103 105  CO2 28 29 28   GLUCOSE 115* 157* 126*  BUN 8 9 13   CREATININE 1.05 0.90 0.79  CALCIUM 9.0 9.1 9.1  MG  --  2.5*  --   PHOS  --  3.5  --    GFR: Estimated Creatinine Clearance: 98.2 mL/min (by C-G formula based on SCr of 0.79 mg/dL).   Liver Function Tests: Recent Labs  Lab 11/27/17 1853  AST 29  ALT 36  ALKPHOS 89  BILITOT 1.3*  PROT 7.8  ALBUMIN 4.5   Cardiac Enzymes: Recent Labs  Lab 11/27/17 1136  TROPONINI <0.03   CBG: Recent Labs  Lab 11/27/17 2203  GLUCAP 133*   Urine analysis:    Component Value Date/Time   COLORURINE AMBER (A) 06/30/2017 0810   APPEARANCEUR CLOUDY (A) 06/30/2017 0810   LABSPEC 1.015 06/30/2017 0810   PHURINE 7.0 06/30/2017 0810   GLUCOSEU NEGATIVE 06/30/2017 0810   HGBUR LARGE (A) 06/30/2017 0810   BILIRUBINUR NEGATIVE 06/30/2017 0810   KETONESUR NEGATIVE 06/30/2017 0810   PROTEINUR 100 (A) 06/30/2017 0810   NITRITE NEGATIVE 06/30/2017 0810   LEUKOCYTESUR LARGE (A) 06/30/2017 0810    Recent Results (from the past 240 hour(s))  MRSA PCR Screening     Status: None   Collection Time: 11/27/17  3:01 PM  Result Value Ref Range Status   MRSA by PCR NEGATIVE NEGATIVE Final    Comment:        The GeneXpert MRSA Assay (FDA approved for NASAL specimens only), is one component of a comprehensive MRSA colonization surveillance program. It is not intended to diagnose MRSA infection nor to guide or monitor treatment for MRSA infections. Performed at [facility], [address], [place], [place] [address]      Radiology Studies: Dg Chest [facility] 1 View  Result Date: 11/27/2017 CLINICAL DATA:  Shortness of breath. EXAM: PORTABLE CHEST 1 VIEW COMPARISON:  Radiographs of November 11, 2016. FINDINGS: Stable cardiomediastinal silhouette. No pneumothorax or pleural effusion is noted. Both lungs are clear. The visualized skeletal structures are unremarkable. IMPRESSION: No acute cardiopulmonary abnormality seen. Electronically  Signed   By: Lupita Raider, M.D.   On: 11/27/2017 12:04   Scheduled Meds: . amLODipine  10 mg Oral Daily  . arformoterol  15 mcg Nebulization BID  . aspirin EC  162 mg Oral Daily  . budesonide (PULMICORT) nebulizer solution  0.5 mg Nebulization BID  . doxycycline  100 mg Oral Q12H  . DULoxetine  20 mg Oral QHS  . finasteride  5 mg Oral Daily  . fludrocortisone  0.1 mg Oral Daily  . furosemide  20 mg Oral Daily  . heparin  5,000 Units Subcutaneous Q8H  . ipratropium  0.5 mg Nebulization TID  . lactulose  10 g Oral TID  . methylPREDNISolone (SOLU-MEDROL) injection  60 mg Intravenous Q8H  . multivitamin with minerals  1 tablet Oral Q1200  . ondansetron  8 mg Oral Daily  . pantoprazole  40 mg Oral Daily  . simvastatin  20 mg Oral q1800  . sodium chloride flush  3 mL Intravenous Q12H  . tamsulosin  0.4 mg Oral Daily   Continuous Infusions: . sodium chloride  LOS: 1 day    Time spent: 30 minutes.     Vassie Lollarlos Paytience Bures, MD [facility] Pager [phone]  If 7PM-7AM, please contact night-coverage [email or web address] Password [email or web address] 11/28/2017, 5:35 PM

## 2017-11-28 NOTE — Progress Notes (Signed)
Nutrition Brief Note  Remote nutrition screen performed as part of COPD gold protocol.   Wt Readings from Last 15 Encounters:  11/27/17 87.8 kg  06/30/17 87.1 kg  01/06/17 79.8 kg  01/04/17 79.8 kg  11/11/16 80.3 kg  03/26/16 83 kg  01/09/16 88.2 kg  08/20/15 85.3 kg  07/15/15 87.1 kg  07/03/15 91.4 kg  03/23/15 86.2 kg  03/22/15 86.2 kg  06/23/14 87.8 kg  10/13/13 86.6 kg  09/17/13 83 kg   Body mass index is 28.58 kg/m. Patient meets criteria for overweight based on current BMI.   RD screening remotely. Per ED notes, there are no reports of patient experiencing any GI symptoms, poor appetite or weight loss. His MST was 0.   His weight also appears largely stable. Was 192 lbs in March and is now 193.5 lbs. for the past 4-5 years.   Current diet order is heart healthy. There is no documented intake of meals as of yet.   No nutrition interventions warranted at this time. If nutrition issues arise, please consult RD.   Christophe LouisNathan Nikash Mortensen RD, LDN, CNSC Clinical Nutrition Available Tues-Sat via Pager: [phone] 11/28/2017 9:31 AM

## 2017-11-29 LAB — HIV ANTIBODY (ROUTINE TESTING W REFLEX): HIV Screen 4th Generation wRfx: NONREACTIVE

## 2017-11-29 MED ORDER — PREDNISONE 20 MG PO TABS: ORAL_TABLET | ORAL | 0 refills | Status: DC

## 2017-11-29 MED ORDER — GUAIFENESIN ER 600 MG PO TB12: 600.0000 mg | ORAL_TABLET | Freq: Two times a day (BID) | ORAL | 1 refills | Status: AC

## 2017-11-29 MED ORDER — DOXYCYCLINE HYCLATE 100 MG PO TABS: 100.0000 mg | ORAL_TABLET | Freq: Two times a day (BID) | ORAL | 0 refills | Status: AC

## 2017-11-29 MED ORDER — ASPIRIN EC 81 MG PO TBEC: 162.0000 mg | DELAYED_RELEASE_TABLET | Freq: Every day | ORAL | Status: DC

## 2017-11-29 MED ORDER — ALBUTEROL SULFATE (2.5 MG/3ML) 0.083% IN NEBU: 2.5000 mg | INHALATION_SOLUTION | Freq: Four times a day (QID) | RESPIRATORY_TRACT | 12 refills | Status: DC | PRN

## 2017-11-29 NOTE — Discharge Summary (Signed)
Physician Discharge Summary  Fabio NeighborsRoy L Votaw ZOX:[id number] DOB: [birth year] DOA: 11/27/2017  PCP: Elfredia NevinsFusco, Lawrence, MD  Admit date: 11/27/2017 Discharge date: 11/29/2017  Time spent: 35 minutes  Recommendations for Outpatient Follow-up:  1. Repeat basic metabolic panel to follow electrolytes and renal function 2. Reassess blood pressure and further adjust antihypertensive regimen as needed. 3. Follow symptoms resolution from recent COPD exacerbation and if needed arrange outpatient follow-up with pulmonologist to further assess his condition and adjust medication as needed.   Discharge Diagnoses:  Principal Problem:   Acute on chronic respiratory failure (HCC) Active Problems:   Anxiety   GERD (gastroesophageal reflux disease)   COPD exacerbation (HCC)   Chronic pain syndrome   BPH (benign prostatic hyperplasia)   Discharge Condition: Stable and improved.  Discharge home with instruction to follow-up with PCP in 10 days.  Diet recommendation: Heart healthy diet.  Filed Weights   11/27/17 1043 11/27/17 1512 11/29/17 0500  Weight: 88.5 kg 87.8 kg 85.4 kg    History of present illness:  67 y.o. male 67 y/o with PMH significant for chronic pain, HTN, COPD ( with chronic resp failure), anxiety and dyslipidemia; who presented to ED with complaints of worsening SOB. Patient reported symptoms started approx 24-48 hours prior to admission and failed to improved with his home nebulizer. He expressed some non productive cough and increase wheezing. No fever, no CP, no nausea, no vomiting, no abd pain, no hematemesis, dysuria, hematuria, melena or any other complaints.  In ED patient CXR demonstrated no acute infiltrates; he was requiring over 6L with increase tachypnea and using accessory muscles mildly. Patient breathing continue to be labor and place on BIPAP.  Hospital Course:  1-acute on chronic respiratory failure in the setting of COPD exacerbation -He is now back to his 4L [place]  supplementation and maintaining good saturation. -Improved air movement bilaterally and feeling much better. -Will discharge on a steroids tapering, resumption of Symbicort, continue doxycycline for 5 more days and as needed albuterol. -Encouraged to continue using flutter valve and Mucinex as instructed.  2-anxiety/depression -No suicidal ideation or hallucinations. -Continue as needed Valium and continue Cymbalta  3-gastroesophageal reflux disease without esophagitis -Continue PPI.  4-chronic pain syndrome -Patient reports stable at this time -Continue the use of  PRN Percocet as per home analgesic regimen. -No prescriptions requested at discharge.  5-BPH -No complains of retention -Will continue to use of Proscar and Flomax.  6-hyperlipidemia -Continue statins.  7-HTN -Continue the use of amlodipine and Lasix -blood pressure is a stable and well-controlled. -Encouraged to follow heart healthy diet.  Procedures:  See below for x-ray reports.  Consultations:  None  Discharge Exam: Vitals:   11/29/17 1338 11/29/17 1406  BP:  (!) 143/92  Pulse:  98  Resp:  18  Temp:  98.3 F (36.8 C)  SpO2: 91% 93%   General exam: Alert, awake, oriented x 3; denies chest pain and express significant improvement in his breathing.  Able to speak in full sentences and back on 4 L oxygen supplementation with good saturation.   Respiratory system: Improved air movement, positive scattered rhonchi, very little expiratory wheezing appreciated. No using accessory muscles. Cardiovascular system:RRR. No murmurs, rubs, gallops. Gastrointestinal system: Abdomen is nondistended, soft and nontender. No organomegaly or masses felt. Normal bowel sounds heard. Central nervous system: Alert and oriented. No focal neurological deficits. Extremities: No cyanosis or clubbing, positive trace edema bilaterally. Skin: No rashes, lesions or ulcers Psychiatry: Judgement and insight appear normal.  Mood & affect  appropriate.   Discharge Instructions   Discharge Instructions    Diet - low sodium heart healthy   Complete by:  As directed    Discharge instructions   Complete by:  As directed    Take medications as prescribed Keep yourself well-hydrated Arrange follow-up with PCP in 10 days Remember to use your albuterol dose as needed no more than 4 times a day (meaning every 6 hours apart).     Allergies as of 11/29/2017      Reactions   Ativan [lorazepam] Other (See Comments)   Dry mouth sinus and breathing problems      Medication List    STOP taking these medications   cephALEXin 500 MG capsule Commonly known as:  KEFLEX     TAKE these medications   albuterol 108 (90 Base) MCG/ACT inhaler Commonly known as:  PROVENTIL HFA;VENTOLIN HFA Inhale 2 puffs into the lungs every 6 (six) hours as needed for wheezing or shortness of breath. What changed:  Another medication with the same name was changed. Make sure you understand how and when to take each.   albuterol (2.5 MG/3ML) 0.083% nebulizer solution Commonly known as:  PROVENTIL Take 3 mLs (2.5 mg total) by nebulization every 6 (six) hours as needed for wheezing or shortness of breath. What changed:  when to take this   amLODipine 5 MG tablet Commonly known as:  NORVASC Take 10 mg by mouth daily.   aspirin EC 81 MG tablet Take 2 tablets (162 mg total) by mouth daily. What changed:  when to take this   diazepam 10 MG tablet Commonly known as:  VALIUM Take 10 mg by mouth 3 (three) times daily. Reported on 07/15/2015   doxycycline 100 MG tablet Commonly known as:  VIBRA-TABS Take 1 tablet (100 mg total) by mouth every 12 (twelve) hours for 5 days.   DULoxetine 20 MG capsule Commonly known as:  CYMBALTA Take 20 mg by mouth at bedtime.   finasteride 5 MG tablet Commonly known as:  PROSCAR Take 1 tablet by mouth daily.   fludrocortisone 0.1 MG tablet Commonly known as:  FLORINEF Take 0.1 mg by mouth  daily.   furosemide 20 MG tablet Commonly known as:  LASIX Take 1 tablet by mouth daily.   guaiFENesin 600 MG 12 hr tablet Commonly known as:  MUCINEX Take 1 tablet (600 mg total) by mouth 2 (two) times daily.   lactulose 10 GM/15ML solution Commonly known as:  CHRONULAC 10 g 4 (four) times daily.   multivitamin with minerals Tabs tablet Take 1 tablet by mouth daily.   omeprazole 40 MG capsule Commonly known as:  PRILOSEC Take 40 mg by mouth daily.   ondansetron 8 MG tablet Commonly known as:  ZOFRAN Take 1 tablet by mouth daily.   oxyCODONE-acetaminophen 7.5-325 MG tablet Commonly known as:  PERCOCET Take 1 tablet by mouth every 4 (four) hours as needed for pain.   OXYGEN Inhale 4 L into the lungs continuous.   predniSONE 20 MG tablet Commonly known as:  DELTASONE Take 3 tablets by mouth daily X 1 day; then 2 tablets by mouth daily X 2 days; then 1 tablet by mouth daily X 2 days; then 1/2 tablet by mouth daily X 3 days and stop prednisone.   simvastatin 20 MG tablet Commonly known as:  ZOCOR TAKE 1 TABLET BY MOUTH DAILY AT 6 PM.   SYMBICORT 160-4.5 MCG/ACT inhaler Generic drug:  budesonide-formoterol Inhale 2 puffs into the lungs 2 (two) times  daily.   SYSTANE BALANCE 0.6 % Soln Generic drug:  Propylene Glycol Place 1 drop into both eyes 2 (two) times daily as needed (dry eyes).   tamsulosin 0.4 MG Caps capsule Commonly known as:  FLOMAX Take 1 capsule by mouth daily.   tiZANidine 4 MG tablet Commonly known as:  ZANAFLEX Take 1 tablet by mouth daily as needed for muscle spasms.      Allergies  Allergen Reactions  . Ativan [Lorazepam] Other (See Comments)    Dry mouth sinus and breathing problems    Follow-up Information    Elfredia Nevins, MD. Schedule an appointment as soon as possible for a visit in 10 day(s).   Specialty:  Internal Medicine Contact information: [address] [place] [address] [phone]           The  results of significant diagnostics from this hospitalization (including imaging, microbiology, ancillary and laboratory) are listed below for reference.    Significant Diagnostic Studies: Dg Chest Port 1 View  Result Date: 11/27/2017 CLINICAL DATA:  Shortness of breath. EXAM: PORTABLE CHEST 1 VIEW COMPARISON:  Radiographs of November 11, 2016. FINDINGS: Stable cardiomediastinal silhouette. No pneumothorax or pleural effusion is noted. Both lungs are clear. The visualized skeletal structures are unremarkable. IMPRESSION: No acute cardiopulmonary abnormality seen. Electronically Signed   By: Lupita Raider, M.D.   On: 11/27/2017 12:04    Microbiology: Recent Results (from the past 240 hour(s))  MRSA PCR Screening     Status: None   Collection Time: 11/27/17  3:01 PM  Result Value Ref Range Status   MRSA by PCR NEGATIVE NEGATIVE Final    Comment:        The GeneXpert MRSA Assay (FDA approved for NASAL specimens only), is one component of a comprehensive MRSA colonization surveillance program. It is not intended to diagnose MRSA infection nor to guide or monitor treatment for MRSA infections. Performed at [facility], [address], [place], [place] [address]      Labs: Basic Metabolic Panel: Recent Labs  Lab 11/27/17 1136 11/27/17 1853 11/28/17 0451  NA 140 141 142  K 3.6 4.0 3.8  CL 102 103 105  CO2 28 29 28   GLUCOSE 115* 157* 126*  BUN 8 9 13   CREATININE 1.05 0.90 0.79  CALCIUM 9.0 9.1 9.1  MG  --  2.5*  --   PHOS  --  3.5  --    Liver Function Tests: Recent Labs  Lab 11/27/17 1853  AST 29  ALT 36  ALKPHOS 89  BILITOT 1.3*  PROT 7.8  ALBUMIN 4.5   CBC: Recent Labs  Lab 11/27/17 1136 11/27/17 1853 11/28/17 0451  WBC 13.3* 13.1* 10.1  NEUTROABS 12.2*  --   --   HGB 15.3 14.8 14.2  HCT 45.1 43.8 42.4  MCV 91.1 92.4 92.0  PLT 278 273 274   Cardiac Enzymes: Recent Labs  Lab 11/27/17 1136  TROPONINI <0.03   CBG: Recent Labs  Lab 11/27/17 2203   GLUCAP 133*    Signed:  Vassie Loll MD.  [facility] 11/29/2017, 3:07 PM

## 2017-11-29 NOTE — Progress Notes (Signed)
IV removed.  Site WNL.  AVS reviewed with patient.  Verbalized understanding of discharge instructions, physician follow-up, medications.  Patient transported by NT via w/c to main entrance at discharge.  Patient stable at time of discharge.  Oxygen brought from home for transport home per patient.

## 2017-12-02 DIAGNOSIS — J9620 Acute and chronic respiratory failure, unspecified whether with hypoxia or hypercapnia: Secondary | ICD-10-CM | POA: Diagnosis not present

## 2017-12-02 DIAGNOSIS — J441 Chronic obstructive pulmonary disease with (acute) exacerbation: Secondary | ICD-10-CM | POA: Diagnosis not present

## 2017-12-02 DIAGNOSIS — G894 Chronic pain syndrome: Secondary | ICD-10-CM | POA: Diagnosis not present

## 2017-12-02 DIAGNOSIS — K219 Gastro-esophageal reflux disease without esophagitis: Secondary | ICD-10-CM | POA: Diagnosis not present

## 2017-12-02 DIAGNOSIS — Z1389 Encounter for screening for other disorder: Secondary | ICD-10-CM | POA: Diagnosis not present

## 2017-12-06 DIAGNOSIS — J441 Chronic obstructive pulmonary disease with (acute) exacerbation: Secondary | ICD-10-CM | POA: Diagnosis not present

## 2017-12-08 DIAGNOSIS — R0600 Dyspnea, unspecified: Secondary | ICD-10-CM | POA: Diagnosis not present

## 2017-12-08 DIAGNOSIS — I10 Essential (primary) hypertension: Secondary | ICD-10-CM | POA: Diagnosis not present

## 2017-12-08 DIAGNOSIS — J449 Chronic obstructive pulmonary disease, unspecified: Secondary | ICD-10-CM | POA: Diagnosis not present

## 2017-12-08 DIAGNOSIS — R0902 Hypoxemia: Secondary | ICD-10-CM | POA: Diagnosis not present

## 2017-12-08 DIAGNOSIS — M1991 Primary osteoarthritis, unspecified site: Secondary | ICD-10-CM | POA: Diagnosis not present

## 2017-12-08 DIAGNOSIS — K219 Gastro-esophageal reflux disease without esophagitis: Secondary | ICD-10-CM | POA: Diagnosis not present

## 2017-12-08 DIAGNOSIS — I2781 Cor pulmonale (chronic): Secondary | ICD-10-CM | POA: Diagnosis not present

## 2017-12-08 DIAGNOSIS — I509 Heart failure, unspecified: Secondary | ICD-10-CM | POA: Diagnosis not present

## 2017-12-08 DIAGNOSIS — J9610 Chronic respiratory failure, unspecified whether with hypoxia or hypercapnia: Secondary | ICD-10-CM | POA: Diagnosis not present

## 2017-12-14 DIAGNOSIS — J22 Unspecified acute lower respiratory infection: Secondary | ICD-10-CM | POA: Diagnosis not present

## 2017-12-14 DIAGNOSIS — J441 Chronic obstructive pulmonary disease with (acute) exacerbation: Secondary | ICD-10-CM | POA: Diagnosis not present

## 2017-12-23 DIAGNOSIS — K219 Gastro-esophageal reflux disease without esophagitis: Secondary | ICD-10-CM | POA: Diagnosis not present

## 2017-12-23 DIAGNOSIS — E119 Type 2 diabetes mellitus without complications: Secondary | ICD-10-CM | POA: Diagnosis not present

## 2017-12-23 DIAGNOSIS — J449 Chronic obstructive pulmonary disease, unspecified: Secondary | ICD-10-CM | POA: Diagnosis not present

## 2017-12-28 DIAGNOSIS — R0600 Dyspnea, unspecified: Secondary | ICD-10-CM | POA: Diagnosis not present

## 2017-12-28 DIAGNOSIS — G4730 Sleep apnea, unspecified: Secondary | ICD-10-CM | POA: Diagnosis not present

## 2017-12-28 DIAGNOSIS — Z23 Encounter for immunization: Secondary | ICD-10-CM | POA: Diagnosis not present

## 2017-12-28 DIAGNOSIS — R0902 Hypoxemia: Secondary | ICD-10-CM | POA: Diagnosis not present

## 2018-01-06 DIAGNOSIS — J441 Chronic obstructive pulmonary disease with (acute) exacerbation: Secondary | ICD-10-CM | POA: Diagnosis not present

## 2018-02-05 DIAGNOSIS — J441 Chronic obstructive pulmonary disease with (acute) exacerbation: Secondary | ICD-10-CM | POA: Diagnosis not present

## 2018-02-10 ENCOUNTER — Other Ambulatory Visit: Payer: Self-pay

## 2018-02-10 NOTE — Patient Outreach (Signed)
[facility]) Care Management  02/10/2018  MEHMET SCALLY 06-06-1950 [id number]   Medication Adherence call to Mr. Fredda Hammed left a message for patient to call back. patient is due on Lisinopril 20 mg under [employer].    Lillia Abed CPhT Pharmacy Technician [facility] Care Management Direct Dial [phone]  Fax [phone] [email or web address]

## 2018-02-23 DIAGNOSIS — Z1389 Encounter for screening for other disorder: Secondary | ICD-10-CM | POA: Diagnosis not present

## 2018-02-23 DIAGNOSIS — I10 Essential (primary) hypertension: Secondary | ICD-10-CM | POA: Diagnosis not present

## 2018-02-23 DIAGNOSIS — J449 Chronic obstructive pulmonary disease, unspecified: Secondary | ICD-10-CM | POA: Diagnosis not present

## 2018-02-23 DIAGNOSIS — G894 Chronic pain syndrome: Secondary | ICD-10-CM | POA: Diagnosis not present

## 2018-02-23 DIAGNOSIS — R339 Retention of urine, unspecified: Secondary | ICD-10-CM | POA: Diagnosis not present

## 2018-03-03 ENCOUNTER — Ambulatory Visit (INDEPENDENT_AMBULATORY_CARE_PROVIDER_SITE_OTHER): Payer: Medicare Other | Admitting: Urology

## 2018-03-03 DIAGNOSIS — R339 Retention of urine, unspecified: Secondary | ICD-10-CM | POA: Diagnosis not present

## 2018-03-03 DIAGNOSIS — N5201 Erectile dysfunction due to arterial insufficiency: Secondary | ICD-10-CM

## 2018-03-08 DIAGNOSIS — J441 Chronic obstructive pulmonary disease with (acute) exacerbation: Secondary | ICD-10-CM | POA: Diagnosis not present

## 2018-03-16 DIAGNOSIS — I10 Essential (primary) hypertension: Secondary | ICD-10-CM | POA: Diagnosis not present

## 2018-03-16 DIAGNOSIS — E782 Mixed hyperlipidemia: Secondary | ICD-10-CM | POA: Diagnosis not present

## 2018-03-16 DIAGNOSIS — I2510 Atherosclerotic heart disease of native coronary artery without angina pectoris: Secondary | ICD-10-CM | POA: Diagnosis not present

## 2018-03-16 DIAGNOSIS — Z1389 Encounter for screening for other disorder: Secondary | ICD-10-CM | POA: Diagnosis not present

## 2018-03-16 DIAGNOSIS — Z0001 Encounter for general adult medical examination with abnormal findings: Secondary | ICD-10-CM | POA: Diagnosis not present

## 2018-03-16 DIAGNOSIS — E119 Type 2 diabetes mellitus without complications: Secondary | ICD-10-CM | POA: Diagnosis not present

## 2018-03-28 IMAGING — CT CT ANGIO CHEST
2 of 5 series · 19 of 36 positions shown · IV contrast (Isovue)
Comparison: 03/23/2015 CT, 11/11/2016 radiographs.

CLINICAL DATA: Hemoptysis and dyspnea since midnight yesterday

EXAM:
CT ANGIOGRAPHY CHEST WITH CONTRAST
TECHNIQUE: Multidetector CT imaging of the chest was performed using the
standard protocol during bolus administration of intravenous
contrast. Multiplanar CT image reconstructions and MIPs were
obtained to evaluate the vascular anatomy.
CONTRAST:  100 mL Isovue 370 intravenous

[Series 4: pe axial st · axial · 0.75mm/px · z∈[+1314,+1608]mm · 18 of 106 slices shown]
[im 4/106  lung]
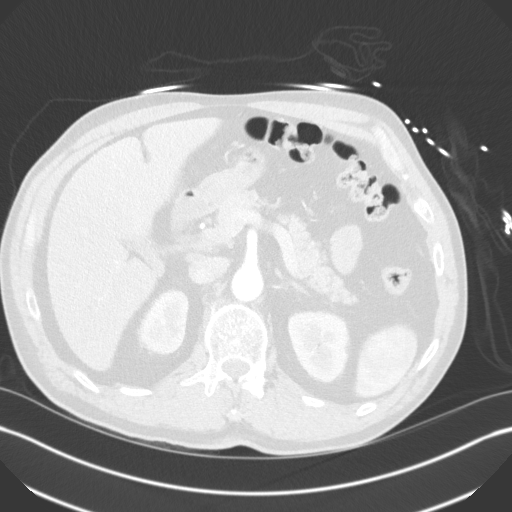
[im 12/106  mediastinal]
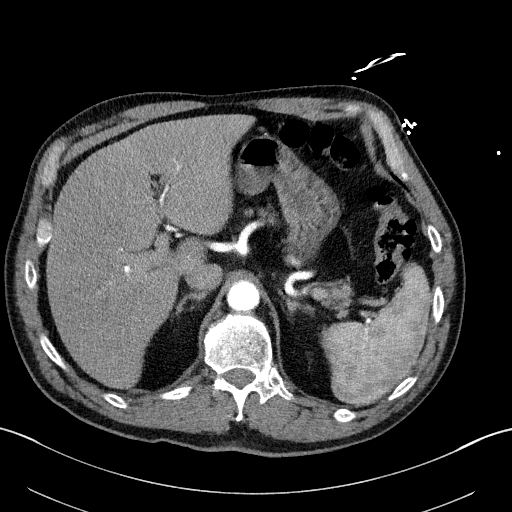
[im 16/106  lung]
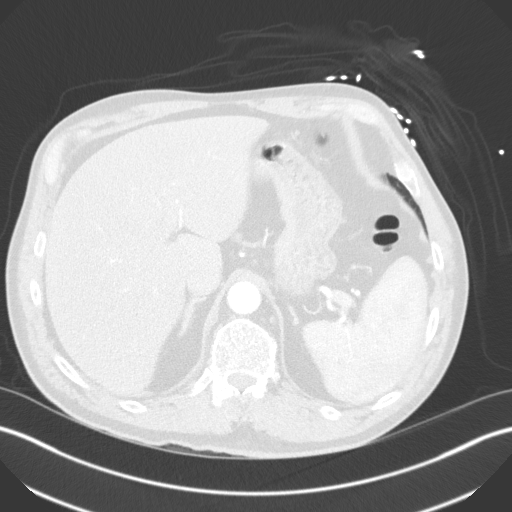
[im 24/106  mediastinal]
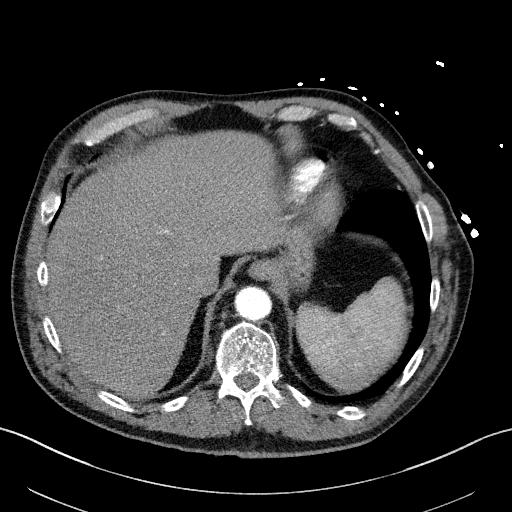
[im 28/106  lung]
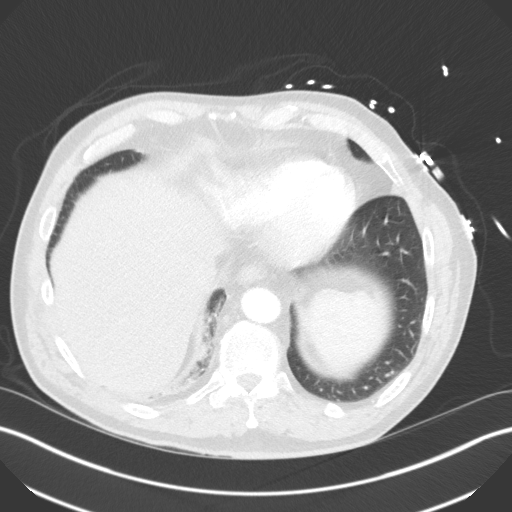
[im 32/106  mediastinal]
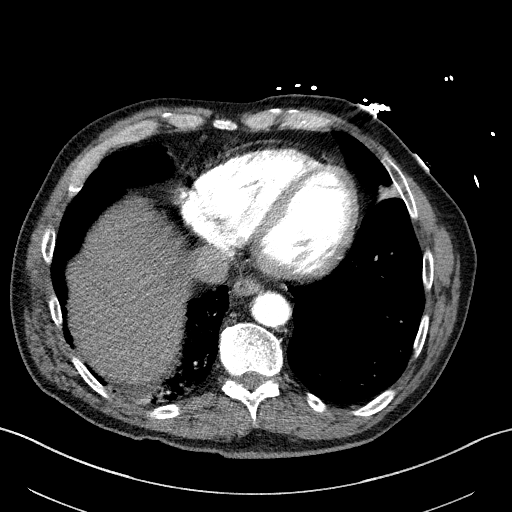
[im 39/106  lung]
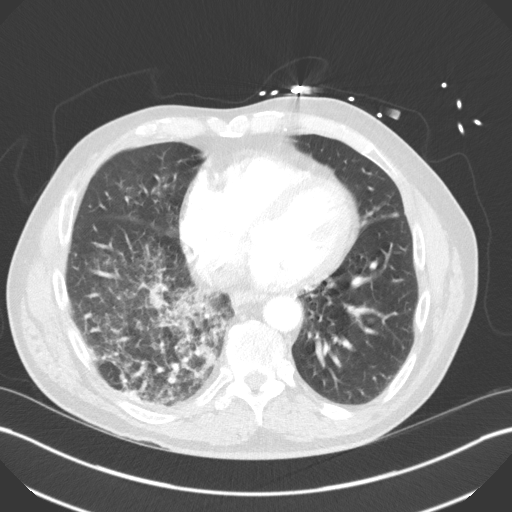
[im 43/106  mediastinal]
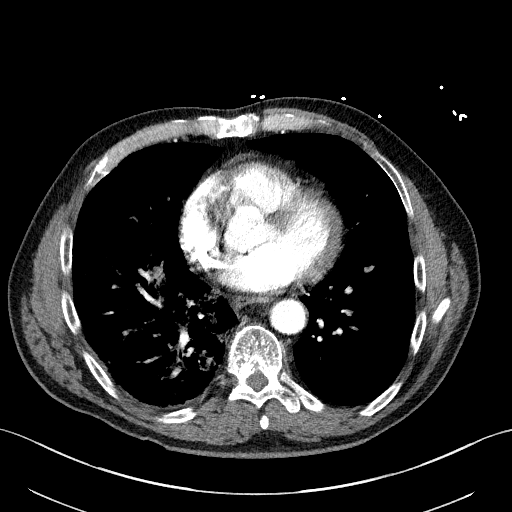
[im 51/106  lung]
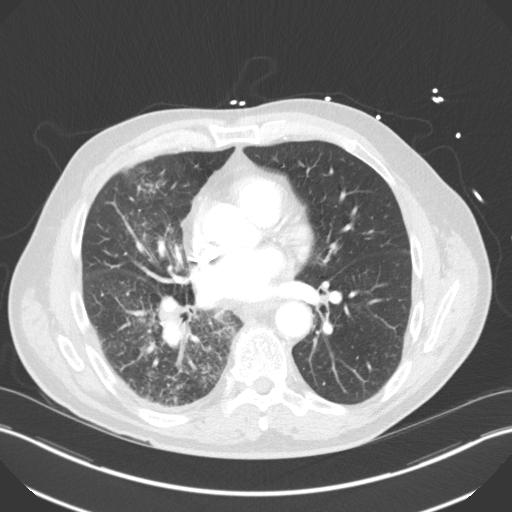
[im 55/106  mediastinal]
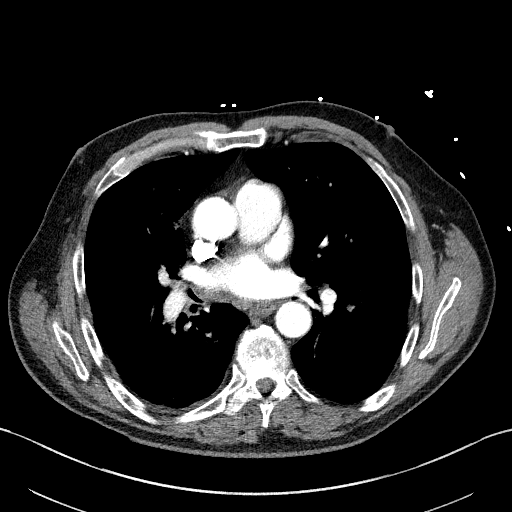
[im 63/106  lung]
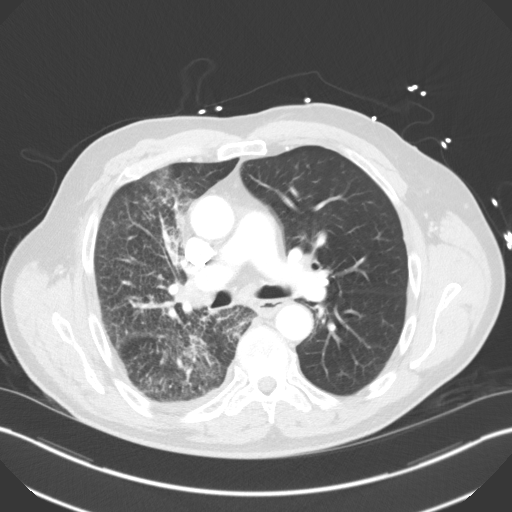
[im 67/106  mediastinal]
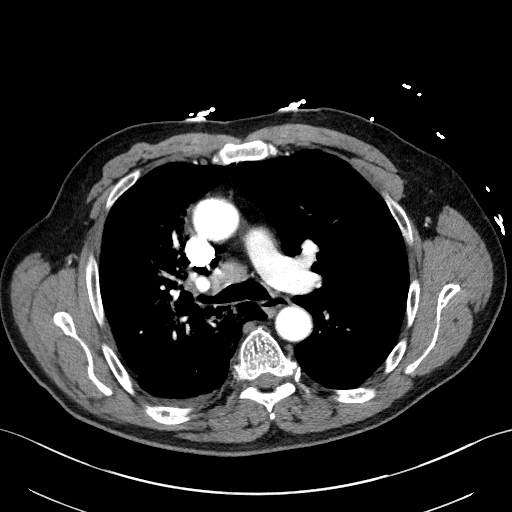
[im 74/106  lung]
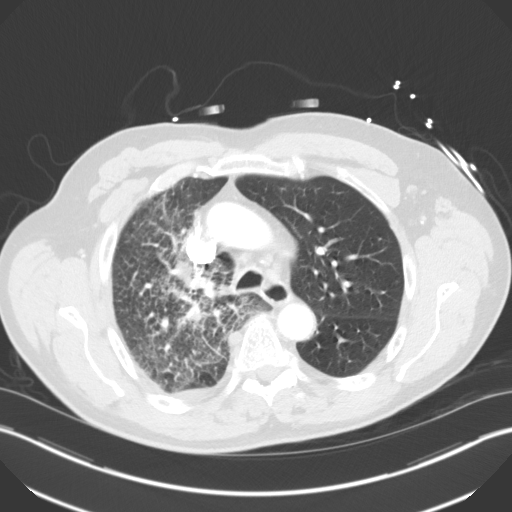
[im 78/106  mediastinal]
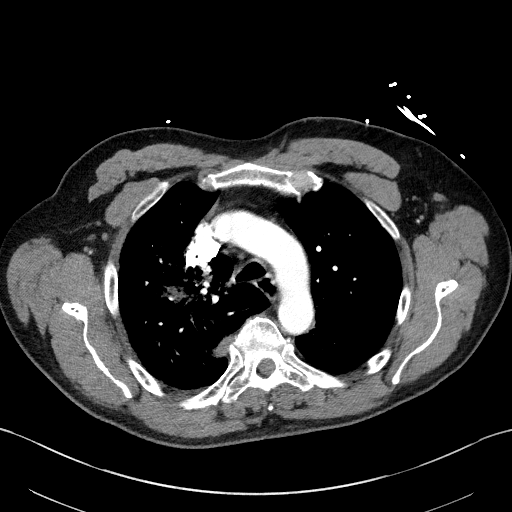
[im 82/106  lung]
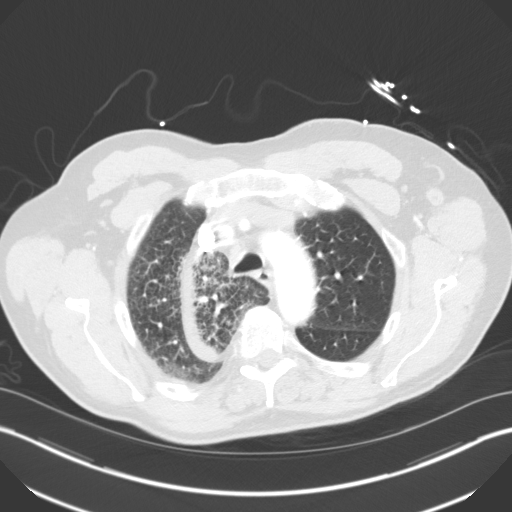
[im 90/106  mediastinal]
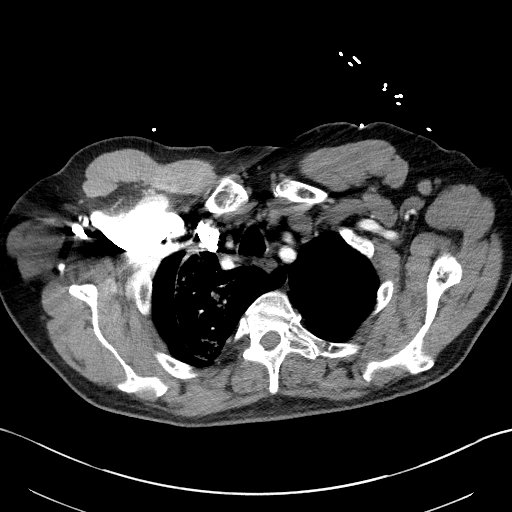
[im 94/106  lung]
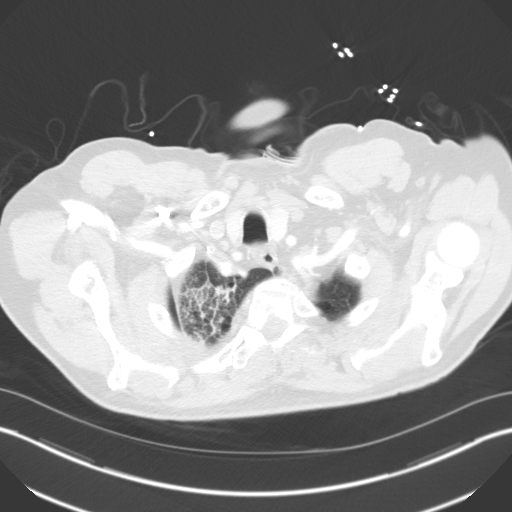
[im 102/106  mediastinal]
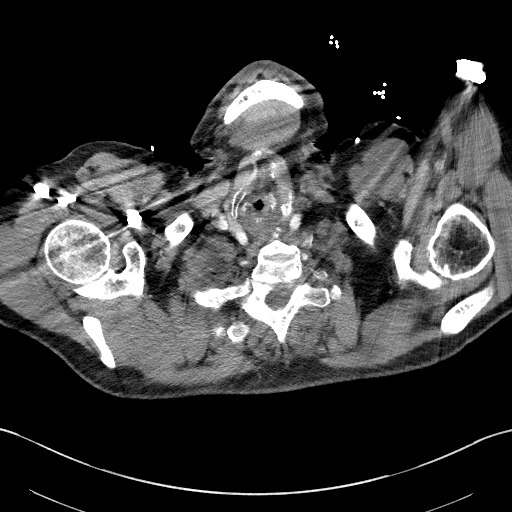

[Series 7: coronal mpr · coronal · 0.70mm/px · 1 of 133 slices shown]
[im 67/133  mediastinal]
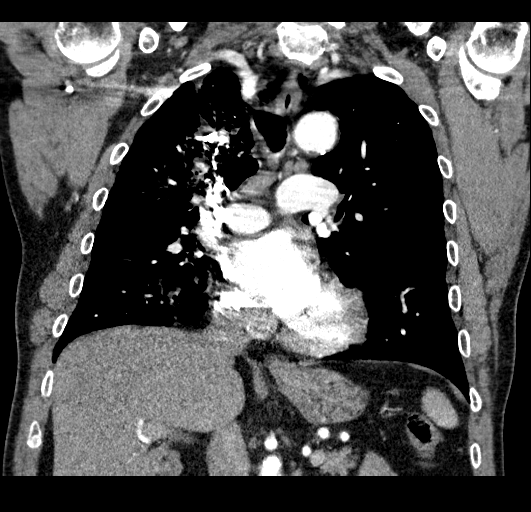

[19 of 36 positions shown; findings below may reference images not displayed]

FINDINGS: Cardiovascular: Satisfactory opacification of the pulmonary arteries
to the segmental level. No evidence of pulmonary embolism. Normal
heart size. No pericardial effusion.

Mediastinum/Nodes: Nonspecific mediastinal nodes, with several 10-11
mm short axis nodes in the AP window, a 1.4 cm node in the right
peritracheal region and a 1.3 cm node in the subcarinal region.
These are enlarged from 03/23/2015.

Lungs/Pleura: 1.5 cm endobronchial mass versus retained secretions
at the right lateral aspect of the distal trachea, at the origin of
the right mainstem bronchus. This is seen to best advantage on
coronal image 78 series 7 but is also visible on axial image 37
series 6.

Diffuse interstitial and alveolar opacities throughout the right
lung. This could represent pneumonia. Neoplasm less likely but not
entirely excluded.

Upper Abdomen: No acute findings.

Musculoskeletal: No significant skeletal lesion.

Review of the MIP images confirms the above findings.
IMPRESSION: 1. Negative for acute pulmonary embolism
2. Endoluminal 1.5 cm mass versus masslike retained secretions, at
the right lateral aspect of the distal trachea at the origin of the
right mainstem bronchus.
3. Extensive opacities throughout the right lung. Left lung is
clear.

## 2018-04-07 DIAGNOSIS — J441 Chronic obstructive pulmonary disease with (acute) exacerbation: Secondary | ICD-10-CM | POA: Diagnosis not present

## 2018-05-08 DIAGNOSIS — J441 Chronic obstructive pulmonary disease with (acute) exacerbation: Secondary | ICD-10-CM | POA: Diagnosis not present

## 2018-05-21 DIAGNOSIS — G894 Chronic pain syndrome: Secondary | ICD-10-CM | POA: Diagnosis not present

## 2018-05-21 DIAGNOSIS — J449 Chronic obstructive pulmonary disease, unspecified: Secondary | ICD-10-CM | POA: Diagnosis not present

## 2018-05-21 DIAGNOSIS — K219 Gastro-esophageal reflux disease without esophagitis: Secondary | ICD-10-CM | POA: Diagnosis not present

## 2018-05-21 DIAGNOSIS — Z6824 Body mass index (BMI) 24.0-24.9, adult: Secondary | ICD-10-CM | POA: Diagnosis not present

## 2018-06-08 DIAGNOSIS — J441 Chronic obstructive pulmonary disease with (acute) exacerbation: Secondary | ICD-10-CM | POA: Diagnosis not present

## 2018-06-18 DIAGNOSIS — I10 Essential (primary) hypertension: Secondary | ICD-10-CM | POA: Diagnosis not present

## 2018-06-18 DIAGNOSIS — E119 Type 2 diabetes mellitus without complications: Secondary | ICD-10-CM | POA: Diagnosis not present

## 2018-06-18 DIAGNOSIS — G894 Chronic pain syndrome: Secondary | ICD-10-CM | POA: Diagnosis not present

## 2018-06-18 DIAGNOSIS — J449 Chronic obstructive pulmonary disease, unspecified: Secondary | ICD-10-CM | POA: Diagnosis not present

## 2018-07-07 DIAGNOSIS — G894 Chronic pain syndrome: Secondary | ICD-10-CM | POA: Diagnosis not present

## 2018-07-07 DIAGNOSIS — J441 Chronic obstructive pulmonary disease with (acute) exacerbation: Secondary | ICD-10-CM | POA: Diagnosis not present

## 2018-07-07 DIAGNOSIS — Z1389 Encounter for screening for other disorder: Secondary | ICD-10-CM | POA: Diagnosis not present

## 2018-08-07 DIAGNOSIS — J441 Chronic obstructive pulmonary disease with (acute) exacerbation: Secondary | ICD-10-CM | POA: Diagnosis not present

## 2018-08-16 DIAGNOSIS — G894 Chronic pain syndrome: Secondary | ICD-10-CM | POA: Diagnosis not present

## 2018-08-16 DIAGNOSIS — M1991 Primary osteoarthritis, unspecified site: Secondary | ICD-10-CM | POA: Diagnosis not present

## 2018-08-16 DIAGNOSIS — J449 Chronic obstructive pulmonary disease, unspecified: Secondary | ICD-10-CM | POA: Diagnosis not present

## 2018-08-16 DIAGNOSIS — I10 Essential (primary) hypertension: Secondary | ICD-10-CM | POA: Diagnosis not present

## 2018-09-06 DIAGNOSIS — J441 Chronic obstructive pulmonary disease with (acute) exacerbation: Secondary | ICD-10-CM | POA: Diagnosis not present

## 2018-09-15 ENCOUNTER — Ambulatory Visit (INDEPENDENT_AMBULATORY_CARE_PROVIDER_SITE_OTHER): Payer: Medicare Other | Admitting: Urology

## 2018-09-15 DIAGNOSIS — N5201 Erectile dysfunction due to arterial insufficiency: Secondary | ICD-10-CM

## 2018-09-15 DIAGNOSIS — R339 Retention of urine, unspecified: Secondary | ICD-10-CM

## 2018-09-20 DIAGNOSIS — G894 Chronic pain syndrome: Secondary | ICD-10-CM | POA: Diagnosis not present

## 2018-09-20 DIAGNOSIS — J329 Chronic sinusitis, unspecified: Secondary | ICD-10-CM | POA: Diagnosis not present

## 2018-09-20 DIAGNOSIS — R42 Dizziness and giddiness: Secondary | ICD-10-CM | POA: Diagnosis not present

## 2018-09-20 DIAGNOSIS — I10 Essential (primary) hypertension: Secondary | ICD-10-CM | POA: Diagnosis not present

## 2018-09-20 DIAGNOSIS — Z6824 Body mass index (BMI) 24.0-24.9, adult: Secondary | ICD-10-CM | POA: Diagnosis not present

## 2018-10-07 DIAGNOSIS — J441 Chronic obstructive pulmonary disease with (acute) exacerbation: Secondary | ICD-10-CM | POA: Diagnosis not present

## 2018-11-01 DIAGNOSIS — Z0000 Encounter for general adult medical examination without abnormal findings: Secondary | ICD-10-CM | POA: Diagnosis not present

## 2018-11-01 DIAGNOSIS — G43909 Migraine, unspecified, not intractable, without status migrainosus: Secondary | ICD-10-CM | POA: Diagnosis not present

## 2018-11-01 DIAGNOSIS — E119 Type 2 diabetes mellitus without complications: Secondary | ICD-10-CM | POA: Diagnosis not present

## 2018-11-01 DIAGNOSIS — Z6824 Body mass index (BMI) 24.0-24.9, adult: Secondary | ICD-10-CM | POA: Diagnosis not present

## 2018-11-01 DIAGNOSIS — E748 Other specified disorders of carbohydrate metabolism: Secondary | ICD-10-CM | POA: Diagnosis not present

## 2018-11-01 DIAGNOSIS — J449 Chronic obstructive pulmonary disease, unspecified: Secondary | ICD-10-CM | POA: Diagnosis not present

## 2018-11-01 DIAGNOSIS — Z1389 Encounter for screening for other disorder: Secondary | ICD-10-CM | POA: Diagnosis not present

## 2018-11-01 DIAGNOSIS — Z0001 Encounter for general adult medical examination with abnormal findings: Secondary | ICD-10-CM | POA: Diagnosis not present

## 2018-11-01 DIAGNOSIS — G894 Chronic pain syndrome: Secondary | ICD-10-CM | POA: Diagnosis not present

## 2018-11-06 DIAGNOSIS — J441 Chronic obstructive pulmonary disease with (acute) exacerbation: Secondary | ICD-10-CM | POA: Diagnosis not present

## 2018-11-30 DIAGNOSIS — G894 Chronic pain syndrome: Secondary | ICD-10-CM | POA: Diagnosis not present

## 2018-12-07 DIAGNOSIS — J441 Chronic obstructive pulmonary disease with (acute) exacerbation: Secondary | ICD-10-CM | POA: Diagnosis not present

## 2018-12-28 DIAGNOSIS — Z23 Encounter for immunization: Secondary | ICD-10-CM | POA: Diagnosis not present

## 2018-12-28 DIAGNOSIS — G894 Chronic pain syndrome: Secondary | ICD-10-CM | POA: Diagnosis not present

## 2018-12-28 DIAGNOSIS — M1991 Primary osteoarthritis, unspecified site: Secondary | ICD-10-CM | POA: Diagnosis not present

## 2019-01-05 DIAGNOSIS — J439 Emphysema, unspecified: Secondary | ICD-10-CM | POA: Diagnosis not present

## 2019-01-05 DIAGNOSIS — J449 Chronic obstructive pulmonary disease, unspecified: Secondary | ICD-10-CM | POA: Diagnosis not present

## 2019-01-07 DIAGNOSIS — J441 Chronic obstructive pulmonary disease with (acute) exacerbation: Secondary | ICD-10-CM | POA: Diagnosis not present

## 2019-01-19 DIAGNOSIS — E782 Mixed hyperlipidemia: Secondary | ICD-10-CM | POA: Diagnosis not present

## 2019-01-19 DIAGNOSIS — I10 Essential (primary) hypertension: Secondary | ICD-10-CM | POA: Diagnosis not present

## 2019-01-19 DIAGNOSIS — J449 Chronic obstructive pulmonary disease, unspecified: Secondary | ICD-10-CM | POA: Diagnosis not present

## 2019-01-19 DIAGNOSIS — E119 Type 2 diabetes mellitus without complications: Secondary | ICD-10-CM | POA: Diagnosis not present

## 2019-01-27 DIAGNOSIS — J449 Chronic obstructive pulmonary disease, unspecified: Secondary | ICD-10-CM | POA: Diagnosis not present

## 2019-01-27 DIAGNOSIS — G894 Chronic pain syndrome: Secondary | ICD-10-CM | POA: Diagnosis not present

## 2019-02-06 DIAGNOSIS — J441 Chronic obstructive pulmonary disease with (acute) exacerbation: Secondary | ICD-10-CM | POA: Diagnosis not present

## 2019-02-19 DIAGNOSIS — M1991 Primary osteoarthritis, unspecified site: Secondary | ICD-10-CM | POA: Diagnosis not present

## 2019-02-19 DIAGNOSIS — I10 Essential (primary) hypertension: Secondary | ICD-10-CM | POA: Diagnosis not present

## 2019-02-19 DIAGNOSIS — E7849 Other hyperlipidemia: Secondary | ICD-10-CM | POA: Diagnosis not present

## 2019-02-19 DIAGNOSIS — J449 Chronic obstructive pulmonary disease, unspecified: Secondary | ICD-10-CM | POA: Diagnosis not present

## 2019-02-24 DIAGNOSIS — R109 Unspecified abdominal pain: Secondary | ICD-10-CM | POA: Diagnosis not present

## 2019-02-24 DIAGNOSIS — R39198 Other difficulties with micturition: Secondary | ICD-10-CM | POA: Diagnosis not present

## 2019-02-24 DIAGNOSIS — G894 Chronic pain syndrome: Secondary | ICD-10-CM | POA: Diagnosis not present

## 2019-03-09 DIAGNOSIS — J441 Chronic obstructive pulmonary disease with (acute) exacerbation: Secondary | ICD-10-CM | POA: Diagnosis not present

## 2019-03-14 DIAGNOSIS — E119 Type 2 diabetes mellitus without complications: Secondary | ICD-10-CM | POA: Diagnosis not present

## 2019-03-25 DIAGNOSIS — G894 Chronic pain syndrome: Secondary | ICD-10-CM | POA: Diagnosis not present

## 2019-04-08 DIAGNOSIS — J441 Chronic obstructive pulmonary disease with (acute) exacerbation: Secondary | ICD-10-CM | POA: Diagnosis not present

## 2019-04-21 DIAGNOSIS — M1991 Primary osteoarthritis, unspecified site: Secondary | ICD-10-CM | POA: Diagnosis not present

## 2019-04-21 DIAGNOSIS — I2510 Atherosclerotic heart disease of native coronary artery without angina pectoris: Secondary | ICD-10-CM | POA: Diagnosis not present

## 2019-04-21 DIAGNOSIS — J449 Chronic obstructive pulmonary disease, unspecified: Secondary | ICD-10-CM | POA: Diagnosis not present

## 2019-04-21 DIAGNOSIS — Z720 Tobacco use: Secondary | ICD-10-CM | POA: Diagnosis not present

## 2019-04-21 DIAGNOSIS — G894 Chronic pain syndrome: Secondary | ICD-10-CM | POA: Diagnosis not present

## 2019-05-09 DIAGNOSIS — J441 Chronic obstructive pulmonary disease with (acute) exacerbation: Secondary | ICD-10-CM | POA: Diagnosis not present

## 2019-05-17 DIAGNOSIS — Z0000 Encounter for general adult medical examination without abnormal findings: Secondary | ICD-10-CM | POA: Diagnosis not present

## 2019-05-17 DIAGNOSIS — I10 Essential (primary) hypertension: Secondary | ICD-10-CM | POA: Diagnosis not present

## 2019-05-17 DIAGNOSIS — G894 Chronic pain syndrome: Secondary | ICD-10-CM | POA: Diagnosis not present

## 2019-05-22 DIAGNOSIS — J449 Chronic obstructive pulmonary disease, unspecified: Secondary | ICD-10-CM | POA: Diagnosis not present

## 2019-05-22 DIAGNOSIS — Z720 Tobacco use: Secondary | ICD-10-CM | POA: Diagnosis not present

## 2019-05-22 DIAGNOSIS — I2510 Atherosclerotic heart disease of native coronary artery without angina pectoris: Secondary | ICD-10-CM | POA: Diagnosis not present

## 2019-05-22 DIAGNOSIS — M1991 Primary osteoarthritis, unspecified site: Secondary | ICD-10-CM | POA: Diagnosis not present

## 2019-06-09 DIAGNOSIS — J441 Chronic obstructive pulmonary disease with (acute) exacerbation: Secondary | ICD-10-CM | POA: Diagnosis not present

## 2019-06-16 DIAGNOSIS — G894 Chronic pain syndrome: Secondary | ICD-10-CM | POA: Diagnosis not present

## 2019-06-19 DIAGNOSIS — J449 Chronic obstructive pulmonary disease, unspecified: Secondary | ICD-10-CM | POA: Diagnosis not present

## 2019-06-19 DIAGNOSIS — I2510 Atherosclerotic heart disease of native coronary artery without angina pectoris: Secondary | ICD-10-CM | POA: Diagnosis not present

## 2019-06-19 DIAGNOSIS — Z720 Tobacco use: Secondary | ICD-10-CM | POA: Diagnosis not present

## 2019-06-19 DIAGNOSIS — M1991 Primary osteoarthritis, unspecified site: Secondary | ICD-10-CM | POA: Diagnosis not present

## 2019-07-07 DIAGNOSIS — J441 Chronic obstructive pulmonary disease with (acute) exacerbation: Secondary | ICD-10-CM | POA: Diagnosis not present

## 2019-07-08 ENCOUNTER — Ambulatory Visit: Payer: Medicare Other | Attending: Internal Medicine

## 2019-07-08 DIAGNOSIS — Z23 Encounter for immunization: Secondary | ICD-10-CM

## 2019-07-08 NOTE — Progress Notes (Signed)
   Covid-19 Vaccination Clinic  Name:  [name]    MRN: [id number] DOB: 12-27-1950  07/08/2019  Mr. Shipper was observed post Covid-19 immunization for 15 minutes without incident. He was provided with Vaccine Information Sheet and instruction to access the V-Safe system.   Mr. Reppucci was instructed to call 911 with any severe reactions post vaccine: Marland Kitchen Difficulty breathing  . Swelling of face and throat  . A fast heartbeat  . A bad rash all over body  . Dizziness and weakness   Immunizations Administered    Name Date Dose VIS Date Route   Moderna COVID-19 Vaccine 07/08/2019 11:44 AM 0.5 mL 03/22/2019 Intramuscular   Manufacturer: Moderna   Lot: [id number]   NDC: 48185-631-49

## 2019-07-14 DIAGNOSIS — G894 Chronic pain syndrome: Secondary | ICD-10-CM | POA: Diagnosis not present

## 2019-07-20 DIAGNOSIS — Z720 Tobacco use: Secondary | ICD-10-CM | POA: Diagnosis not present

## 2019-07-20 DIAGNOSIS — M1991 Primary osteoarthritis, unspecified site: Secondary | ICD-10-CM | POA: Diagnosis not present

## 2019-07-20 DIAGNOSIS — I2510 Atherosclerotic heart disease of native coronary artery without angina pectoris: Secondary | ICD-10-CM | POA: Diagnosis not present

## 2019-07-20 DIAGNOSIS — J449 Chronic obstructive pulmonary disease, unspecified: Secondary | ICD-10-CM | POA: Diagnosis not present

## 2019-07-29 ENCOUNTER — Encounter: Payer: Self-pay | Admitting: Cardiovascular Disease

## 2019-08-07 DIAGNOSIS — J441 Chronic obstructive pulmonary disease with (acute) exacerbation: Secondary | ICD-10-CM | POA: Diagnosis not present

## 2019-08-09 ENCOUNTER — Ambulatory Visit: Payer: Medicare Other

## 2019-08-11 DIAGNOSIS — J301 Allergic rhinitis due to pollen: Secondary | ICD-10-CM | POA: Diagnosis not present

## 2019-08-11 DIAGNOSIS — G894 Chronic pain syndrome: Secondary | ICD-10-CM | POA: Diagnosis not present

## 2019-08-18 ENCOUNTER — Emergency Department (HOSPITAL_COMMUNITY): Payer: Medicare Other

## 2019-08-18 ENCOUNTER — Encounter (HOSPITAL_COMMUNITY): Payer: Self-pay

## 2019-08-18 ENCOUNTER — Other Ambulatory Visit: Payer: Self-pay

## 2019-08-18 ENCOUNTER — Emergency Department (HOSPITAL_COMMUNITY)
Admission: EM | Admit: 2019-08-18 | Discharge: 2019-08-18 | Disposition: A | Discharge status: Home / Self Care | Payer: Medicare Other | Attending: Emergency Medicine | Admitting: Emergency Medicine

## 2019-08-18 DIAGNOSIS — I2510 Atherosclerotic heart disease of native coronary artery without angina pectoris: Secondary | ICD-10-CM | POA: Diagnosis not present

## 2019-08-18 DIAGNOSIS — R000 Tachycardia, unspecified: Secondary | ICD-10-CM | POA: Insufficient documentation

## 2019-08-18 DIAGNOSIS — Z7982 Long term (current) use of aspirin: Secondary | ICD-10-CM | POA: Insufficient documentation

## 2019-08-18 DIAGNOSIS — R2243 Localized swelling, mass and lump, lower limb, bilateral: Secondary | ICD-10-CM | POA: Insufficient documentation

## 2019-08-18 DIAGNOSIS — R42 Dizziness and giddiness: Secondary | ICD-10-CM | POA: Diagnosis not present

## 2019-08-18 DIAGNOSIS — J449 Chronic obstructive pulmonary disease, unspecified: Secondary | ICD-10-CM | POA: Diagnosis not present

## 2019-08-18 DIAGNOSIS — R630 Anorexia: Secondary | ICD-10-CM | POA: Insufficient documentation

## 2019-08-18 DIAGNOSIS — I10 Essential (primary) hypertension: Secondary | ICD-10-CM | POA: Diagnosis not present

## 2019-08-18 DIAGNOSIS — Z87891 Personal history of nicotine dependence: Secondary | ICD-10-CM | POA: Insufficient documentation

## 2019-08-18 DIAGNOSIS — R531 Weakness: Secondary | ICD-10-CM | POA: Insufficient documentation

## 2019-08-18 DIAGNOSIS — Z79899 Other long term (current) drug therapy: Secondary | ICD-10-CM | POA: Diagnosis not present

## 2019-08-18 DIAGNOSIS — I959 Hypotension, unspecified: Secondary | ICD-10-CM | POA: Insufficient documentation

## 2019-08-18 DIAGNOSIS — R5383 Other fatigue: Secondary | ICD-10-CM | POA: Insufficient documentation

## 2019-08-18 DIAGNOSIS — R0789 Other chest pain: Secondary | ICD-10-CM | POA: Diagnosis not present

## 2019-08-18 DIAGNOSIS — R0902 Hypoxemia: Secondary | ICD-10-CM | POA: Diagnosis not present

## 2019-08-18 LAB — CBC WITH DIFFERENTIAL/PLATELET
Abs Immature Granulocytes: 0.62 10*3/uL — ABNORMAL HIGH (ref 0.00–0.07)
Basophils Absolute: 0.1 10*3/uL (ref 0.0–0.1)
Basophils Relative: 1 %
Eosinophils Absolute: 0.4 10*3/uL (ref 0.0–0.5)
Eosinophils Relative: 3 %
HCT: 40.8 % (ref 39.0–52.0)
Hemoglobin: 13.3 g/dL (ref 13.0–17.0)
Immature Granulocytes: 5 %
Lymphocytes Relative: 8 %
Lymphs Abs: 1 10*3/uL (ref 0.7–4.0)
MCH: 30 pg (ref 26.0–34.0)
MCHC: 32.6 g/dL (ref 30.0–36.0)
MCV: 92.1 fL (ref 80.0–100.0)
Monocytes Absolute: 1.2 10*3/uL — ABNORMAL HIGH (ref 0.1–1.0)
Monocytes Relative: 9 %
Neutro Abs: 9.9 10*3/uL — ABNORMAL HIGH (ref 1.7–7.7)
Neutrophils Relative %: 74 %
Platelets: 429 10*3/uL — ABNORMAL HIGH (ref 150–400)
RBC: 4.43 MIL/uL (ref 4.22–5.81)
RDW: 12.9 % (ref 11.5–15.5)
WBC: 13.2 10*3/uL — ABNORMAL HIGH (ref 4.0–10.5)
nRBC: 0 % (ref 0.0–0.2)

## 2019-08-18 LAB — BRAIN NATRIURETIC PEPTIDE: B Natriuretic Peptide: 24 pg/mL (ref 0.0–100.0)

## 2019-08-18 LAB — URINALYSIS, ROUTINE W REFLEX MICROSCOPIC
Bilirubin Urine: NEGATIVE
Glucose, UA: NEGATIVE mg/dL
Hgb urine dipstick: NEGATIVE
Ketones, ur: NEGATIVE mg/dL
Leukocytes,Ua: NEGATIVE
Nitrite: NEGATIVE
Protein, ur: NEGATIVE mg/dL
Specific Gravity, Urine: <1.005 — ABNORMAL LOW (ref 1.005–1.030)
pH: 6 (ref 5.0–8.0)

## 2019-08-18 LAB — COMPREHENSIVE METABOLIC PANEL
ALT: 24 U/L (ref 0–44)
AST: 15 U/L (ref 15–41)
Albumin: 3.5 g/dL (ref 3.5–5.0)
Alkaline Phosphatase: 67 U/L (ref 38–126)
Anion gap: 11 (ref 5–15)
BUN: 13 mg/dL (ref 8–23)
CO2: 24 mmol/L (ref 22–32)
Calcium: 8.7 mg/dL — ABNORMAL LOW (ref 8.9–10.3)
Chloride: 102 mmol/L (ref 98–111)
Creatinine, Ser: 0.98 mg/dL (ref 0.61–1.24)
GFR calc Af Amer: >60 mL/min (ref >60)
GFR calc non Af Amer: >60 mL/min (ref >60)
Glucose, Bld: 102 mg/dL — ABNORMAL HIGH (ref 70–99)
Potassium: 4.3 mmol/L (ref 3.5–5.1)
Sodium: 137 mmol/L (ref 135–145)
Total Bilirubin: 0.8 mg/dL (ref 0.3–1.2)
Total Protein: 6.3 g/dL — ABNORMAL LOW (ref 6.5–8.1)

## 2019-08-18 LAB — LACTIC ACID, PLASMA: Lactic Acid, Venous: 1.3 mmol/L (ref 0.5–1.9)

## 2019-08-18 LAB — TROPONIN I (HIGH SENSITIVITY)
Troponin I (High Sensitivity): 3 ng/L (ref <18)
Troponin I (High Sensitivity): 3 ng/L (ref <18)

## 2019-08-18 LAB — CBG MONITORING, ED: Glucose-Capillary: 105 mg/dL — ABNORMAL HIGH (ref 70–99)

## 2019-08-18 MED ORDER — SODIUM CHLORIDE 0.9 % IV BOLUS: 1000.0000 mL | Freq: Once | INTRAVENOUS | Status: DC

## 2019-08-18 MED ORDER — SODIUM CHLORIDE 0.9 % IV BOLUS
1000.0000 mL | Freq: Once | INTRAVENOUS | Status: AC
  Administered 2019-08-18: 1000 mL via INTRAVENOUS

## 2019-08-18 NOTE — Discharge Instructions (Addendum)
Your lab tests, exam and xrays are reassuring today.  Make sure you are drinking plenty of fluids.  Also you should see a cardiologist for further evaluation of these episodes of chest pain - call Dr Wyline Mood for an office visit for this.  In the interim return here for any worsening symptoms. In the interim, I recommend taking a baby aspirin 81 mg daily which is protective of your heart.    See the referral list attached.  Also, the contact for [facility] [employer] [facility] [facility] [place] is [phone]

## 2019-08-18 NOTE — ED Triage Notes (Addendum)
PT reports dizziness and lightheaded  X 2 days.  Denies any n/v/d.  Pt also reports chest pain.  PT reports bp was 96 systolic at home and called ems.  EMS reports pt's vitals were wnl.

## 2019-08-18 NOTE — ED Provider Notes (Signed)
[facility] EMERGENCY DEPARTMENT Provider Note   CSN: [id number] Arrival date & time: 08/18/19  9470     History Chief Complaint  Patient presents with  . Dizziness    [name] is a 69 y.o. male with a history of CAD, COPD, hypertension, hyperlipidemia presenting for evaluation of generalized weakness and lightheadedness.  He reports starting to feel bad several days ago.  He had a COPD exacerbation last week including cough and wheezing which was treated with a course of prednisone and he states the symptoms have significantly improved.  He denies fevers, also no current respiratory symptoms, no nausea or vomiting, no abdominal pain, dysuria or diarrhea.  He has had a reduced appetite but has been hydrating.  He checked his blood pressure at home this morning and his systolic number was 96.  He does report intermittent episodes of chest pain described as chest pressure, with exertion, last episode occurred yesterday afternoon with ambulation.  The symptom resolved spontaneously with rest.  Per review of chart patient's last cardiac evaluation was a Cardiolite resting stress test 10/19/2013 which was, completed by Dr. Eden Emms.  The history is provided by the patient.       Past Medical History:  Diagnosis Date  . Anxiety   . CAD (coronary artery disease)   . Chronic pain   . 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 Dyslipidemia   . History of tobacco abuse    80 pack year history   . Hypertension   . On home O2    4L N/C  . Opioid dependence (HCC)   . Respiratory failure [facility])     Patient Active Problem List   Diagnosis Date Noted  . Acute on chronic respiratory failure (HCC) 11/27/2017  . BPH (benign prostatic hyperplasia) 11/27/2017  . Cough with hemoptysis 11/13/2016  . 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 Vitamin D insufficiency 04/01/2016  . Elevated C-reactive protein (CRP) 03/28/2016  . Chronic pain syndrome 03/26/2016  . Chronic bilateral  low back pain without sciatica (R>L) 03/26/2016  . Long term current use of opiate analgesic 03/26/2016  . Long term prescription opiate use 03/26/2016  . Encounter for therapeutic drug level monitoring 03/26/2016  . Encounter for pain management planning 03/26/2016  . Chronic sacroiliac joint pain (B) (R>L) 03/26/2016  . Chronic hip pain, left 03/26/2016  . Chronic right hip pain 03/26/2016  . Chronic hip pain (B) (L>R) 03/26/2016  . Chronic left sacroiliac pain 03/26/2016  . Chronic right sacroiliac pain 03/26/2016  . HCAP (healthcare-associated pneumonia) 01/08/2016  . PNA (pneumonia) 12/25/2015  . Chronic respiratory failure with hypoxia (HCC) 12/25/2015  . COPD exacerbation (HCC)   . Sepsis (HCC) 07/03/2015  . CAP (community acquired pneumonia) 07/03/2015  . Acute on chronic respiratory failure with hypoxia (HCC) 07/03/2015  . GERD (gastroesophageal reflux disease) 07/03/2015  . Coronary artery disease 10/13/2013  . Anxiety 10/13/2013  . Dyslipidemia 06/23/2013  . COPD (chronic obstructive pulmonary disease) (HCC) 06/23/2013  . SHOULDER PAIN 08/16/2008  . SHOULDER IMPINGEMENT SYNDROME, LEFT 08/16/2008    Past Surgical History:  Procedure Laterality Date  . CARDIAC CATHETERIZATION  08/10/2008   right & left - normal coronaries (Dr. Claudia Desanctis)  . NM MYOCAR PERF WALL MOTION  07/2008   bruce myoview - mild ischemia in basal inferoseptal & mid inferoseptal regions; EF 63%  . TRANSTHORACIC ECHOCARDIOGRAM  07/2008   EF=>55%; RV mildly dilated; RA mild-mod dilated; mild mitral annular calcification & mild MR;  AV mildly sclerotic       Family History  Problem Relation Age of Onset  . Leukemia Mother   . Suicidality Father     Social History   Tobacco Use  . Smoking status: Former Smoker    Start date: 04/21/1974    Quit date: 06/22/2007    Years since quitting: 12.1  . Smokeless tobacco: Never Used  Substance Use Topics  . Alcohol use: No    Alcohol/week: 0.0 standard  drinks  . Drug use: No    Home Medications Prior to Admission medications   Medication Sig Start Date End Date Taking? Authorizing Provider  albuterol (PROVENTIL HFA;VENTOLIN HFA) 108 (90 BASE) MCG/ACT inhaler Inhale 2 puffs into the lungs every 6 (six) hours as needed for wheezing or shortness of breath.   Yes [provider]  albuterol (PROVENTIL) (2.5 MG/3ML) 0.083% nebulizer solution Take 3 mLs (2.5 mg total) by nebulization every 6 (six) hours as needed for wheezing or shortness of breath. 11/29/17  Yes Barton Dubois, MD  amLODipine (NORVASC) 5 MG tablet Take 10 mg by mouth daily.  10/14/16  Yes [provider]  aspirin EC 81 MG tablet Take 2 tablets (162 mg total) by mouth daily. 11/29/17  Yes Barton Dubois, MD  DULoxetine (CYMBALTA) 20 MG capsule Take 20 mg by mouth at bedtime.    Yes [provider]  finasteride (PROSCAR) 5 MG tablet Take 1 tablet by mouth daily. 01/14/17  Yes [provider]  furosemide (LASIX) 20 MG tablet Take 1 tablet by mouth daily. 11/17/17  Yes [provider]  Multiple Vitamin (MULTIVITAMIN WITH MINERALS) TABS tablet Take 1 tablet by mouth daily.   Yes [provider]  omeprazole (PRILOSEC) 40 MG capsule Take 40 mg by mouth daily.  01/10/13  Yes [provider]  ondansetron (ZOFRAN) 8 MG tablet Take 1 tablet by mouth daily. 11/21/17  Yes [provider]  oxyCODONE-acetaminophen (PERCOCET) 7.5-325 MG per tablet Take 1 tablet by mouth every 4 (four) hours as needed for pain.   Yes [provider]  potassium chloride SA (KLOR-CON) 20 MEQ tablet Take 20 mEq by mouth daily. 08/13/19  Yes [provider]  Propylene Glycol (SYSTANE BALANCE) 0.6 % SOLN Place 1 drop into both eyes 2 (two) times daily as needed (dry eyes).   Yes [provider]  simvastatin (ZOCOR) 20 MG tablet TAKE 1 TABLET BY MOUTH DAILY AT 6 PM. 09/21/15  Yes Herminio Commons, MD  SYMBICORT 160-4.5 MCG/ACT  inhaler Inhale 2 puffs into the lungs 2 (two) times daily. 12/21/15  Yes [provider]  tamsulosin (FLOMAX) 0.4 MG CAPS capsule Take 1 capsule by mouth daily. 01/14/17  Yes [provider]  tiZANidine (ZANAFLEX) 4 MG tablet Take 1 tablet by mouth daily as needed for muscle spasms. 01/03/17  Yes [provider]  diazepam (VALIUM) 10 MG tablet Take 10 mg by mouth 3 (three) times daily. Reported on 07/15/2015    [provider]  fludrocortisone (FLORINEF) 0.1 MG tablet Take 0.1 mg by mouth daily.  11/01/16   [provider]  lactulose (CHRONULAC) 10 GM/15ML solution 10 g 4 (four) times daily.  02/11/16   [provider]  OXYGEN Inhale 4 L into the lungs continuous.    [provider]  predniSONE (DELTASONE) 20 MG tablet Take 3 tablets by mouth daily X 1 day; then 2 tablets by mouth daily X 2 days; then 1 tablet by mouth daily X 2 days;  then 1/2 tablet by mouth daily X 3 days and stop prednisone. Patient not taking: Reported on 08/18/2019 11/29/17   Vassie Loll, MD    Allergies    Ativan [lorazepam]  Review of Systems   Review of Systems  Constitutional: Positive for fatigue. Negative for chills and fever.  HENT: Negative for congestion and sore throat.   Eyes: Negative.   Respiratory: Negative for chest tightness and wheezing.   Gastrointestinal: Negative for abdominal pain.  Genitourinary: Negative.  Negative for dysuria.  Musculoskeletal: Negative for arthralgias, joint swelling and neck pain.  Skin: Negative.  Negative for rash and wound.  Neurological: Positive for light-headedness. Negative for numbness.  Psychiatric/Behavioral: Negative.     Physical Exam Updated Vital Signs BP 119/81   Pulse 79   Temp 98.7 F (37.1 C) (Oral)   Resp 12   Ht 5\' 9"  (1.753 m)   Wt 81.6 kg   SpO2 96%   BMI 26.58 kg/m   Physical Exam Vitals and nursing note reviewed.  Constitutional:      Appearance: He is well-developed.  HENT:      Head: Normocephalic and atraumatic.     Mouth/Throat:     Mouth: Mucous membranes are moist.  Eyes:     Conjunctiva/sclera: Conjunctivae normal.  Cardiovascular:     Rate and Rhythm: Regular rhythm. Tachycardia present.     Heart sounds: Normal heart sounds.     Comments: Bilateral lower extremity pitting edema to mid tibia. Pulmonary:     Effort: Pulmonary effort is normal. No respiratory distress.     Breath sounds: Normal breath sounds. No wheezing, rhonchi or rales.     Comments: Distant breath sounds throughout consistent with COPD.  No rhonchi or rales present.  No wheezing. Abdominal:     General: Bowel sounds are normal. There is no distension.     Palpations: Abdomen is soft.     Tenderness: There is no abdominal tenderness. There is no guarding or rebound.  Musculoskeletal:        General: Normal range of motion.     Cervical back: Normal range of motion.     Right lower leg: 1+ Edema present.     Left lower leg: 1+ Edema present.  Skin:    General: Skin is warm and dry.     Comments: 4 cm healing abrasion right distal forearm.  Neurological:     Mental Status: He is alert.     ED Results / Procedures / Treatments   Labs (all labs ordered are listed, but only abnormal results are displayed) Labs Reviewed  URINALYSIS, ROUTINE W REFLEX MICROSCOPIC - Abnormal; Notable for the following components:      Result Value   Specific Gravity, Urine <1.005 (*)    All other components within normal limits  CBC WITH DIFFERENTIAL/PLATELET - Abnormal; Notable for the following components:   WBC 13.2 (*)    Platelets 429 (*)    Neutro Abs 9.9 (*)    Monocytes Absolute 1.2 (*)    Abs Immature Granulocytes 0.62 (*)    All other components within normal limits  COMPREHENSIVE METABOLIC PANEL - Abnormal; Notable for the following components:   Glucose, Bld 102 (*)    Calcium 8.7 (*)    Total Protein 6.3 (*)    All other components within normal limits  CBG MONITORING, ED -  Abnormal; Notable for the following components:   Glucose-Capillary 105 (*)    All other components within normal limits  CULTURE,  BLOOD (ROUTINE X 2)  CULTURE, BLOOD (ROUTINE X 2)  URINE CULTURE  LACTIC ACID, PLASMA  BRAIN NATRIURETIC PEPTIDE  TROPONIN I (HIGH SENSITIVITY)  TROPONIN I (HIGH SENSITIVITY)    EKG EKG Interpretation  Date/Time:  Thursday August 18 2019 09:18:32 EDT Ventricular Rate:  103 PR Interval:  148 QRS Duration: 84 QT Interval:  336 QTC Calculation: 440 R Axis:   31 Text Interpretation: Sinus tachycardia with Fusion complexes Possible Anteroseptal infarct , age undetermined Abnormal ECG Confirmed by Blane Ohara [phone]) on 08/18/2019 1:46:04 PM   Radiology DG Chest Port 1 View  Result Date: 08/18/2019 CLINICAL DATA:  Dizziness with weakness and hypotension. EXAM: PORTABLE CHEST 1 VIEW COMPARISON:  11/27/2017, 11/11/2016 FINDINGS: Lungs are adequately inflated without focal airspace consolidation or effusion. Chronic density over the lateral right base. Cardiomediastinal silhouette and remainder of the exam is unchanged. IMPRESSION: No active disease. Electronically Signed   By: Elberta Fortis M.D.   On: 08/18/2019 09:59    Procedures Procedures (including critical care time)  Medications Ordered in ED Medications  sodium chloride 0.9 % bolus 1,000 mL (0 mLs Intravenous Stopped 08/18/19 1506)    ED Course  I have reviewed the triage vital signs and the nursing notes.  Pertinent labs & imaging results that were available during my care of the patient were reviewed by me and considered in my medical decision making (see chart for details).    MDM Rules/Calculators/A&P                      Lightheadedness of unclear etiology.  Pt's labs and imaging reviewed and discussed with him.  He does not appear dehydrated per labs, and with peripheral edema, judicious IV fluids given. Lactate negative ruling out sepsis. BNP normal.  He does have an elevated wbc  count, but no source of infection with stable cxr, UA negative, culture pending, also blood cx collected and pending.  Delta troponin negative - doubt atypical ACS. However he did endorse 10 minute episode of chest pressure yesterday. No indication for admission today but he would benefit from close cardiology f/u.  Given referral for this -he understands to call for appt.  Return precautions outlined.  He tolerated PO intake.   Pt seen by Dr Jodi Mourning prior to dc home.  The patient appears reasonably screened and/or stabilized for discharge and I doubt any other medical condition or other [facility] requiring further screening, evaluation, or treatment in the ED at this time prior to discharge.  Final Clinical Impression(s) / ED Diagnoses Final diagnoses:  Lightheadedness    Rx / DC Orders ED Discharge Orders    None       Victoriano Lain 08/18/19 1525    Blane Ohara, MD 08/18/19 [phone]

## 2019-08-18 NOTE — ED Notes (Signed)
Patient feeling better, given crackers and soda.

## 2019-08-19 ENCOUNTER — Telehealth: Payer: Self-pay

## 2019-08-19 DIAGNOSIS — M1991 Primary osteoarthritis, unspecified site: Secondary | ICD-10-CM | POA: Diagnosis not present

## 2019-08-19 DIAGNOSIS — J449 Chronic obstructive pulmonary disease, unspecified: Secondary | ICD-10-CM | POA: Diagnosis not present

## 2019-08-19 DIAGNOSIS — Z720 Tobacco use: Secondary | ICD-10-CM | POA: Diagnosis not present

## 2019-08-19 DIAGNOSIS — I2510 Atherosclerotic heart disease of native coronary artery without angina pectoris: Secondary | ICD-10-CM | POA: Diagnosis not present

## 2019-08-20 ENCOUNTER — Ambulatory Visit: Payer: Medicare Other | Attending: Internal Medicine

## 2019-08-20 DIAGNOSIS — Z23 Encounter for immunization: Secondary | ICD-10-CM

## 2019-08-20 LAB — URINE CULTURE: Culture: NO GROWTH

## 2019-08-20 NOTE — Progress Notes (Signed)
   Covid-19 Vaccination Clinic  Name:  REFUGIO VANDEVOORDE    MRN: [id number] DOB: 11-16-1950  08/20/2019  Mr. Hendrickson was observed post Covid-19 immunization for 30 minutes based on pre-vaccination screening without incident. He was provided with Vaccine Information Sheet and instruction to access the V-Safe system.   Mr. Turgeon was instructed to call 911 with any severe reactions post vaccine: Marland Kitchen Difficulty breathing  . Swelling of face and throat  . A fast heartbeat  . A bad rash all over body  . Dizziness and weakness   Immunizations Administered    Name Date Dose VIS Date Route   Moderna COVID-19 Vaccine 08/20/2019  9:32 AM 0.5 mL 03/2019 Intramuscular   Manufacturer: Moderna   Lot: [id number]   NDC: 16579-038-33

## 2019-08-21 LAB — CULTURE, BLOOD (ROUTINE X 2): Culture: NO GROWTH

## 2019-08-23 LAB — CULTURE, BLOOD (ROUTINE X 2): Culture: NO GROWTH

## 2019-09-06 DIAGNOSIS — J441 Chronic obstructive pulmonary disease with (acute) exacerbation: Secondary | ICD-10-CM | POA: Diagnosis not present

## 2019-09-13 DIAGNOSIS — Z6824 Body mass index (BMI) 24.0-24.9, adult: Secondary | ICD-10-CM | POA: Diagnosis not present

## 2019-09-13 DIAGNOSIS — G894 Chronic pain syndrome: Secondary | ICD-10-CM | POA: Diagnosis not present

## 2019-09-13 DIAGNOSIS — K219 Gastro-esophageal reflux disease without esophagitis: Secondary | ICD-10-CM | POA: Diagnosis not present

## 2019-09-13 DIAGNOSIS — J329 Chronic sinusitis, unspecified: Secondary | ICD-10-CM | POA: Diagnosis not present

## 2019-09-19 DIAGNOSIS — M1991 Primary osteoarthritis, unspecified site: Secondary | ICD-10-CM | POA: Diagnosis not present

## 2019-09-19 DIAGNOSIS — Z720 Tobacco use: Secondary | ICD-10-CM | POA: Diagnosis not present

## 2019-09-19 DIAGNOSIS — I2510 Atherosclerotic heart disease of native coronary artery without angina pectoris: Secondary | ICD-10-CM | POA: Diagnosis not present

## 2019-09-19 DIAGNOSIS — J449 Chronic obstructive pulmonary disease, unspecified: Secondary | ICD-10-CM | POA: Diagnosis not present

## 2019-09-23 ENCOUNTER — Telehealth: Payer: Self-pay | Admitting: Urology

## 2019-09-23 ENCOUNTER — Other Ambulatory Visit: Payer: Self-pay

## 2019-09-23 DIAGNOSIS — N401 Enlarged prostate with lower urinary tract symptoms: Secondary | ICD-10-CM

## 2019-09-23 MED ORDER — TAMSULOSIN HCL 0.4 MG PO CAPS: 0.4000 mg | ORAL_CAPSULE | Freq: Two times a day (BID) | ORAL | 1 refills | Status: DC

## 2019-09-23 NOTE — Telephone Encounter (Signed)
Pt requests refill on tamsulosin to [facility]

## 2019-09-23 NOTE — Telephone Encounter (Signed)
Refill sent in- can you call pt needs ov with [facility] next available to continue refills.

## 2019-10-03 DIAGNOSIS — J449 Chronic obstructive pulmonary disease, unspecified: Secondary | ICD-10-CM | POA: Diagnosis not present

## 2019-10-07 DIAGNOSIS — J441 Chronic obstructive pulmonary disease with (acute) exacerbation: Secondary | ICD-10-CM | POA: Diagnosis not present

## 2019-10-13 DIAGNOSIS — M1991 Primary osteoarthritis, unspecified site: Secondary | ICD-10-CM | POA: Diagnosis not present

## 2019-10-13 DIAGNOSIS — G894 Chronic pain syndrome: Secondary | ICD-10-CM | POA: Diagnosis not present

## 2019-10-19 DIAGNOSIS — J449 Chronic obstructive pulmonary disease, unspecified: Secondary | ICD-10-CM | POA: Diagnosis not present

## 2019-10-19 DIAGNOSIS — I2510 Atherosclerotic heart disease of native coronary artery without angina pectoris: Secondary | ICD-10-CM | POA: Diagnosis not present

## 2019-10-19 DIAGNOSIS — Z720 Tobacco use: Secondary | ICD-10-CM | POA: Diagnosis not present

## 2019-10-19 DIAGNOSIS — M1991 Primary osteoarthritis, unspecified site: Secondary | ICD-10-CM | POA: Diagnosis not present

## 2019-10-27 ENCOUNTER — Ambulatory Visit: Payer: Medicare Other | Admitting: Urology

## 2019-11-06 DIAGNOSIS — J441 Chronic obstructive pulmonary disease with (acute) exacerbation: Secondary | ICD-10-CM | POA: Diagnosis not present

## 2019-11-07 ENCOUNTER — Other Ambulatory Visit: Payer: Self-pay

## 2019-11-07 ENCOUNTER — Ambulatory Visit (INDEPENDENT_AMBULATORY_CARE_PROVIDER_SITE_OTHER): Payer: Medicare Other | Admitting: Urology

## 2019-11-07 ENCOUNTER — Other Ambulatory Visit: Payer: Self-pay | Admitting: Urology

## 2019-11-07 ENCOUNTER — Encounter: Payer: Self-pay | Admitting: Urology

## 2019-11-07 VITALS — BP 129/86 | HR 103 | Temp 98.4°F | Ht 69.0 in | Wt 175.0 lb

## 2019-11-07 DIAGNOSIS — N401 Enlarged prostate with lower urinary tract symptoms: Secondary | ICD-10-CM

## 2019-11-07 DIAGNOSIS — R339 Retention of urine, unspecified: Secondary | ICD-10-CM | POA: Diagnosis not present

## 2019-11-07 DIAGNOSIS — N5201 Erectile dysfunction due to arterial insufficiency: Secondary | ICD-10-CM

## 2019-11-07 DIAGNOSIS — N138 Other obstructive and reflux uropathy: Secondary | ICD-10-CM

## 2019-11-07 LAB — PSA: PSA: 0.9 ng/mL (ref <=4.0)

## 2019-11-07 MED ORDER — TADALAFIL 20 MG PO TABS
20.0000 mg | ORAL_TABLET | Freq: Every day | ORAL | 0 refills | Status: DC | PRN
Start: 2019-11-07 — End: 2020-11-05

## 2019-11-07 MED ORDER — TAMSULOSIN HCL 0.4 MG PO CAPS: 0.4000 mg | ORAL_CAPSULE | Freq: Two times a day (BID) | ORAL | 3 refills | Status: DC

## 2019-11-07 MED ORDER — FINASTERIDE 5 MG PO TABS: 5.0000 mg | ORAL_TABLET | Freq: Every day | ORAL | 3 refills | Status: DC

## 2019-11-07 NOTE — Patient Instructions (Signed)
Erectile Dysfunction Erectile dysfunction (ED) is the inability to get or keep an erection in order to have sexual intercourse. Erectile dysfunction may include:  Inability to get an erection.  Lack of enough hardness of the erection to allow penetration.  Loss of the erection before sex is finished. What are the causes? This condition may be caused by:  Certain medicines, such as: ? Pain relievers. ? Antihistamines. ? Antidepressants. ? Blood pressure medicines. ? Water pills (diuretics). ? Ulcer medicines. ? Muscle relaxants. ? Drugs.  Excessive drinking.  Psychological causes, such as: ? Anxiety. ? Depression. ? Sadness. ? Exhaustion. ? Performance fear. ? Stress.  Physical causes, such as: ? Artery problems. This may include diabetes, smoking, liver disease, or atherosclerosis. ? High blood pressure. ? Hormonal problems, such as low testosterone. ? Obesity. ? Nerve problems. This may include back or pelvic injuries, diabetes mellitus, multiple sclerosis, or Parkinson disease. What are the signs or symptoms? Symptoms of this condition include:  Inability to get an erection.  Lack of enough hardness of the erection to allow penetration.  Loss of the erection before sex is finished.  Normal erections at some times, but with frequent unsatisfactory episodes.  Low sexual satisfaction in either partner due to erection problems.  A curved penis occurring with erection. The curve may cause pain or the penis may be too curved to allow for intercourse.  Never having nighttime erections. How is this diagnosed? This condition is often diagnosed by:  Performing a physical exam to find other diseases or specific problems with the penis.  Asking you detailed questions about the problem.  Performing blood tests to check for diabetes mellitus or to measure hormone levels.  Performing other tests to check for underlying health conditions.  Performing an ultrasound  exam to check for scarring.  Performing a test to check blood flow to the penis.  Doing a sleep study at home to measure nighttime erections. How is this treated? This condition may be treated by:  Medicine taken by mouth to help you achieve an erection (oral medicine).  Hormone replacement therapy to replace low testosterone levels.  Medicine that is injected into the penis. Your health care provider may instruct you how to give yourself these injections at home.  Vacuum pump. This is a pump with a ring on it. The pump and ring are placed on the penis and used to create pressure that helps the penis become erect.  Penile implant surgery. In this procedure, you may receive: ? An inflatable implant. This consists of cylinders, a pump, and a reservoir. The cylinders can be inflated with a fluid that helps to create an erection, and they can be deflated after intercourse. ? A semi-rigid implant. This consists of two silicone rubber rods. The rods provide some rigidity. They are also flexible, so the penis can both curve downward in its normal position and become straight for sexual intercourse.  Blood vessel surgery, to improve blood flow to the penis. During this procedure, a blood vessel from a different part of the body is placed into the penis to allow blood to flow around (bypass) damaged or blocked blood vessels.  Lifestyle changes, such as exercising more, losing weight, and quitting smoking. Follow these instructions at home: Medicines   Take over-the-counter and prescription medicines only as told by your health care provider. Do not increase the dosage without first discussing it with your health care provider.  If you are using self-injections, perform injections as directed by your   health care provider. Make sure to avoid any veins that are on the surface of the penis. After giving an injection, apply pressure to the injection site for 5 minutes. General  instructions  Exercise regularly, as directed by your health care provider. Work with your health care provider to lose weight, if needed.  Do not use any products that contain nicotine or tobacco, such as cigarettes and e-cigarettes. If you need help quitting, ask your health care provider.  Before using a vacuum pump, read the instructions that come with the pump and discuss any questions with your health care provider.  Keep all follow-up visits as told by your health care provider. This is important. Contact a health care provider if:  You feel nauseous.  You vomit. Get help right away if:  You are taking oral or injectable medicines and you have an erection that lasts longer than 4 hours. If your health care provider is unavailable, go to the nearest emergency room for evaluation. An erection that lasts much longer than 4 hours can result in permanent damage to your penis.  You have severe pain in your groin or abdomen.  You develop redness or severe swelling of your penis.  You have redness spreading up into your groin or lower abdomen.  You are unable to urinate.  You experience chest pain or a rapid heart beat (palpitations) after taking oral medicines. Summary  Erectile dysfunction (ED) is the inability to get or keep an erection during sexual intercourse. This problem can usually be treated successfully.  This condition is diagnosed based on a physical exam, your symptoms, and tests to determine the cause. Treatment varies depending on the cause, and may include medicines, hormone therapy, surgery, or vacuum pump.  You may need follow-up visits to make sure that you are using your medicines or devices correctly.  Get help right away if you are taking or injecting medicines and you have an erection that lasts longer than 4 hours. This information is not intended to replace advice given to you by your health care provider. Make sure you discuss any questions you have with  your health care provider. Document Revised: 03/20/2017 Document Reviewed: 04/23/2016 Elsevier Patient Education  2020 [employer]  

## 2019-11-07 NOTE — Progress Notes (Signed)
11/07/2019 2:27 PM   [name] 19-Jul-1950 [id number]  Referring provider: Elfredia Nevins, MD [address] [place],  [place] [address]  Urinary rentention and ED  HPI: [name] is a [phone] here for followup for BPH and ED. He has stable LUTS on flomax BID and finasteride. Stream strong. Nocturia 0-1. No urgency or frequency.  He has moderate ED and has not tired any PDE5s recently  His records from [facility] are as follows: I have urinary retention. HPI: [name] is a 69 year-old male established patient who is here for urinary retention.  His problem was diagnosed 01/06/2017. His current symptoms did not begin after he had a surgical procedure. His urinary retention is being treated with flomax and proscar. Patient denies foley catheter, suprapubic tube, intemittent catheterization, hytrin, cardura, uroxatrol, rapaflo, and avodart.   He does not have an abnormal sensation when needing to urinate. He does not have to strain or bear down to start his urinary stream. He does have a good size and strength to his urinary stream. He is not having problems with emptying his bladder well. His urine has not shut off completely.   He is not having problems with urinary control or incontinence.   He has not previously had an indwelling catheter in for more than two weeks at a time.   01/14/2017: He was seen in the ER for urinary retention and a UTI. He was treated with keflex. He has never been on BPH therapy. He had severe LUTS.   01/21/2017: Pt has been taking flomax since last visit   03/04/2017: PVR 135. Pt is on flomax and finasteride   07/07/2017: PVR is 240cc today. Pt was seen for UTI in the ER 1 week ago.   09/02/2017: We increased flomax to BID and pt continue finasteride. His PVR today is 83cc. He stream is stronger.   03/03/2018: He is on flomax BID and finasteride. No new LUTS   09/15/2018: He has stable LUTS on flomax BID and finasteride.   CC: I am having trouble with  my erections. HPI: He first stated noticing pain on approximately 06/19/2016. His symptoms did begin gradually. His symptoms have been worse over the last year.   He does have difficulties achieving an erection. He does have problems maintaining his erections. His erections are straight.   He does not have premature ejaculation. He does not have trouble reaching climax. He does not have anxiety because of the symptoms.   09/02/2017: He tried sildenafil 80mg  which failed to give him a firm erection   09/15/2018: He continues to have issues with ED and cannot afford PDE5s      PMH: Past Medical History:  Diagnosis Date  . Anxiety   . CAD (coronary artery disease)   . Chronic pain   . 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 Dyslipidemia   . History of tobacco abuse    80 pack year history   . Hypertension   . On home O2    4L N/C  . Opioid dependence (HCC)   . Respiratory failure [facility])     Surgical History: Past Surgical History:  Procedure Laterality Date  . CARDIAC CATHETERIZATION  08/10/2008   right & left - normal coronaries (Dr. 08/12/2008)  . NM MYOCAR PERF WALL MOTION  07/2008   bruce myoview - mild ischemia in basal inferoseptal & mid inferoseptal regions; EF 63%  . TRANSTHORACIC ECHOCARDIOGRAM  07/2008   EF=>55%;  RV mildly dilated; RA mild-mod dilated; mild mitral annular calcification & mild Edward; AV mildly sclerotic    Home Medications:  Allergies as of 11/07/2019      Reactions   Ativan [lorazepam] Other (See Comments)   Dry mouth sinus and breathing problems      Medication List       Accurate as of November 07, 2019  2:27 PM. If you have any questions, ask your nurse or doctor.        albuterol 108 (90 Base) MCG/ACT inhaler Commonly known as: VENTOLIN HFA Inhale 2 puffs into the lungs every 6 (six) hours as needed for wheezing or shortness of breath.   albuterol (2.5 MG/3ML) 0.083% nebulizer solution Commonly known as:  PROVENTIL Take 3 mLs (2.5 mg total) by nebulization every 6 (six) hours as needed for wheezing or shortness of breath.   amLODipine 5 MG tablet Commonly known as: NORVASC Take 10 mg by mouth daily.   aspirin EC 81 MG tablet Take 2 tablets (162 mg total) by mouth daily.   diazepam 10 MG tablet Commonly known as: VALIUM Take 10 mg by mouth 3 (three) times daily. Reported on 07/15/2015   DULoxetine 20 MG capsule Commonly known as: CYMBALTA Take 20 mg by mouth at bedtime.   finasteride 5 MG tablet Commonly known as: PROSCAR Take 1 tablet by mouth daily.   fludrocortisone 0.1 MG tablet Commonly known as: FLORINEF Take 0.1 mg by mouth daily.   furosemide 20 MG tablet Commonly known as: LASIX Take 1 tablet by mouth daily.   lactulose 10 GM/15ML solution Commonly known as: CHRONULAC 10 g 4 (four) times daily.   multivitamin with minerals Tabs tablet Take 1 tablet by mouth daily.   omeprazole 40 MG capsule Commonly known as: PRILOSEC Take 40 mg by mouth daily.   ondansetron 8 MG tablet Commonly known as: ZOFRAN Take 1 tablet by mouth daily.   oxyCODONE-acetaminophen 7.5-325 MG tablet Commonly known as: PERCOCET Take 1 tablet by mouth every 4 (four) hours as needed for pain.   OXYGEN Inhale 4 L into the lungs continuous.   potassium chloride SA 20 MEQ tablet Commonly known as: KLOR-CON Take 20 mEq by mouth daily.   predniSONE 20 MG tablet Commonly known as: Deltasone Take 3 tablets by mouth daily X 1 day; then 2 tablets by mouth daily X 2 days; then 1 tablet by mouth daily X 2 days; then 1/2 tablet by mouth daily X 3 days and stop prednisone.   simvastatin 20 MG tablet Commonly known as: ZOCOR TAKE 1 TABLET BY MOUTH DAILY AT 6 PM.   Symbicort 160-4.5 MCG/ACT inhaler Generic drug: budesonide-formoterol Inhale 2 puffs into the lungs 2 (two) times daily.   Systane Balance 0.6 % Soln Generic drug: Propylene Glycol Place 1 drop into both eyes 2 (two) times daily  as needed (dry eyes).   tamsulosin 0.4 MG Caps capsule Commonly known as: FLOMAX Take 1 capsule (0.4 mg total) by mouth in the morning and at bedtime.   tiZANidine 4 MG tablet Commonly known as: ZANAFLEX Take 1 tablet by mouth daily as needed for muscle spasms.       Allergies:  Allergies  Allergen Reactions  . Ativan [Lorazepam] Other (See Comments)    Dry mouth sinus and breathing problems     Family History: Family History  Problem Relation Age of Onset  . Leukemia Mother   . Suicidality Father     Social History:  reports that he quit  smoking about 12 years ago. He started smoking about 45 years ago. He has never used smokeless tobacco. He reports that he does not drink alcohol and does not use drugs.  ROS: All other review of systems were reviewed and are negative except what is noted above in HPI  Physical Exam: BP 129/86   Pulse (!) 103   Temp 98.4 F (36.9 C)   Ht 5\' 9"  (1.753 m)   Wt 175 lb (79.4 kg)   BMI 25.84 kg/m   Constitutional:  Alert and oriented, No acute distress. HEENT: [place] AT, moist mucus membranes.  Trachea midline, no masses. Cardiovascular: No clubbing, cyanosis, or edema. Respiratory: Normal respiratory effort, no increased work of breathing. GI: Abdomen is soft, nontender, nondistended, no abdominal masses GU: No CVA tenderness. Circumcised phallus. No masses/lesions on penis, testis, scrotum. Prostate 40g smooth no nodules no induration.  Lymph: No cervical or inguinal lymphadenopathy. Skin: No rashes, bruises or suspicious lesions. Neurologic: Grossly intact, no focal deficits, moving all 4 extremities. Psychiatric: Normal mood and affect.  Laboratory Data: Lab Results  Component Value Date   WBC 13.2 (H) 08/18/2019   HGB 13.3 08/18/2019   HCT 40.8 08/18/2019   MCV 92.1 08/18/2019   PLT 429 (H) 08/18/2019    Lab Results  Component Value Date   CREATININE 0.98 08/18/2019    No results found for: PSA  No results found for:  TESTOSTERONE  No results found for: HGBA1C  Urinalysis    Component Value Date/Time   COLORURINE YELLOW 08/18/2019 1112   APPEARANCEUR CLEAR 08/18/2019 1112   LABSPEC <1.005 (L) 08/18/2019 1112   PHURINE 6.0 08/18/2019 1112   GLUCOSEU NEGATIVE 08/18/2019 1112   HGBUR NEGATIVE 08/18/2019 1112   BILIRUBINUR NEGATIVE 08/18/2019 1112   KETONESUR NEGATIVE 08/18/2019 1112   PROTEINUR NEGATIVE 08/18/2019 1112   NITRITE NEGATIVE 08/18/2019 1112   LEUKOCYTESUR NEGATIVE 08/18/2019 1112    Lab Results  Component Value Date   BACTERIA RARE (A) 06/30/2017    Pertinent Imaging:  No results found for this or any previous visit.  No results found for this or any previous visit.  No results found for this or any previous visit.  No results found for this or any previous visit.  No results found for this or any previous visit.  No results found for this or any previous visit.  No results found for this or any previous visit.  No results found for this or any previous visit.   Assessment & Plan:    1. Benign prostatic hyperplasia with urinary obstruction -continue flomax BID and finasteride  2. Urinary retention Continue flomax BID and finasteride  3. Erectile dysfunction due to arterial insufficiency Tadalafil 20mg  prn    Return in about 1 year (around 11/06/2020).  , MD  [facility] [place]

## 2019-11-07 NOTE — Progress Notes (Signed)
Urological Symptom Review  Patient is experiencing the following symptoms: None   Review of Systems  Gastrointestinal (upper)  : Negative for upper GI symptoms  Gastrointestinal (lower) : Negative for lower GI symptoms  Constitutional : Negative for symptoms  Skin: Negative for skin symptoms  Eyes: Negative for eye symptoms  Ear/Nose/Throat : Negative for Ear/Nose/Throat symptoms  Hematologic/Lymphatic: Negative for Hematologic/Lymphatic symptoms  Cardiovascular : Negative for cardiovascular symptoms  Respiratory : Negative for respiratory symptoms  Endocrine: None  Musculoskeletal: Negative for musculoskeletal symptoms  Neurological: Negative for neurological symptoms  Psychologic: Negative for psychiatric symptoms 

## 2019-11-10 DIAGNOSIS — I10 Essential (primary) hypertension: Secondary | ICD-10-CM | POA: Diagnosis not present

## 2019-11-10 DIAGNOSIS — E7849 Other hyperlipidemia: Secondary | ICD-10-CM | POA: Diagnosis not present

## 2019-11-10 DIAGNOSIS — E782 Mixed hyperlipidemia: Secondary | ICD-10-CM | POA: Diagnosis not present

## 2019-11-10 DIAGNOSIS — G894 Chronic pain syndrome: Secondary | ICD-10-CM | POA: Diagnosis not present

## 2019-11-10 DIAGNOSIS — R7309 Other abnormal glucose: Secondary | ICD-10-CM | POA: Diagnosis not present

## 2019-11-10 DIAGNOSIS — E7489 Other specified disorders of carbohydrate metabolism: Secondary | ICD-10-CM | POA: Diagnosis not present

## 2019-11-10 DIAGNOSIS — I2510 Atherosclerotic heart disease of native coronary artery without angina pectoris: Secondary | ICD-10-CM | POA: Diagnosis not present

## 2019-11-11 ENCOUNTER — Telehealth: Payer: Self-pay

## 2019-11-11 NOTE — Telephone Encounter (Signed)
Pt made aware

## 2019-11-11 NOTE — Telephone Encounter (Signed)
-----   Message from Malen Gauze, MD sent at 11/11/2019  9:00 AM EDT ----- normal ----- Message ----- From: Gustavus Messing, LPN Sent: 05/05/7260   8:39 AM EDT To: Malen Gauze, MD  Please review

## 2019-11-14 DIAGNOSIS — K219 Gastro-esophageal reflux disease without esophagitis: Secondary | ICD-10-CM | POA: Diagnosis not present

## 2019-11-14 DIAGNOSIS — J9620 Acute and chronic respiratory failure, unspecified whether with hypoxia or hypercapnia: Secondary | ICD-10-CM | POA: Diagnosis not present

## 2019-11-14 DIAGNOSIS — I10 Essential (primary) hypertension: Secondary | ICD-10-CM | POA: Diagnosis not present

## 2019-11-14 DIAGNOSIS — J449 Chronic obstructive pulmonary disease, unspecified: Secondary | ICD-10-CM | POA: Diagnosis not present

## 2019-11-18 DIAGNOSIS — M1991 Primary osteoarthritis, unspecified site: Secondary | ICD-10-CM | POA: Diagnosis not present

## 2019-11-18 DIAGNOSIS — I2510 Atherosclerotic heart disease of native coronary artery without angina pectoris: Secondary | ICD-10-CM | POA: Diagnosis not present

## 2019-11-18 DIAGNOSIS — J449 Chronic obstructive pulmonary disease, unspecified: Secondary | ICD-10-CM | POA: Diagnosis not present

## 2019-11-18 DIAGNOSIS — Z720 Tobacco use: Secondary | ICD-10-CM | POA: Diagnosis not present

## 2019-12-07 DIAGNOSIS — J441 Chronic obstructive pulmonary disease with (acute) exacerbation: Secondary | ICD-10-CM | POA: Diagnosis not present

## 2019-12-09 DIAGNOSIS — G894 Chronic pain syndrome: Secondary | ICD-10-CM | POA: Diagnosis not present

## 2019-12-09 DIAGNOSIS — J449 Chronic obstructive pulmonary disease, unspecified: Secondary | ICD-10-CM | POA: Diagnosis not present

## 2019-12-09 DIAGNOSIS — J42 Unspecified chronic bronchitis: Secondary | ICD-10-CM | POA: Diagnosis not present

## 2019-12-09 DIAGNOSIS — Z6824 Body mass index (BMI) 24.0-24.9, adult: Secondary | ICD-10-CM | POA: Diagnosis not present

## 2020-01-06 DIAGNOSIS — J449 Chronic obstructive pulmonary disease, unspecified: Secondary | ICD-10-CM | POA: Diagnosis not present

## 2020-01-06 DIAGNOSIS — Z6824 Body mass index (BMI) 24.0-24.9, adult: Secondary | ICD-10-CM | POA: Diagnosis not present

## 2020-01-06 DIAGNOSIS — K219 Gastro-esophageal reflux disease without esophagitis: Secondary | ICD-10-CM | POA: Diagnosis not present

## 2020-01-06 DIAGNOSIS — Z23 Encounter for immunization: Secondary | ICD-10-CM | POA: Diagnosis not present

## 2020-01-06 DIAGNOSIS — G894 Chronic pain syndrome: Secondary | ICD-10-CM | POA: Diagnosis not present

## 2020-01-06 DIAGNOSIS — I10 Essential (primary) hypertension: Secondary | ICD-10-CM | POA: Diagnosis not present

## 2020-01-07 DIAGNOSIS — J441 Chronic obstructive pulmonary disease with (acute) exacerbation: Secondary | ICD-10-CM | POA: Diagnosis not present

## 2020-01-19 DIAGNOSIS — J449 Chronic obstructive pulmonary disease, unspecified: Secondary | ICD-10-CM | POA: Diagnosis not present

## 2020-01-19 DIAGNOSIS — M1991 Primary osteoarthritis, unspecified site: Secondary | ICD-10-CM | POA: Diagnosis not present

## 2020-01-19 DIAGNOSIS — Z720 Tobacco use: Secondary | ICD-10-CM | POA: Diagnosis not present

## 2020-01-19 DIAGNOSIS — I2510 Atherosclerotic heart disease of native coronary artery without angina pectoris: Secondary | ICD-10-CM | POA: Diagnosis not present

## 2020-02-06 DIAGNOSIS — J441 Chronic obstructive pulmonary disease with (acute) exacerbation: Secondary | ICD-10-CM | POA: Diagnosis not present

## 2020-02-06 DIAGNOSIS — E7849 Other hyperlipidemia: Secondary | ICD-10-CM | POA: Diagnosis not present

## 2020-02-06 DIAGNOSIS — M1991 Primary osteoarthritis, unspecified site: Secondary | ICD-10-CM | POA: Diagnosis not present

## 2020-02-06 DIAGNOSIS — G894 Chronic pain syndrome: Secondary | ICD-10-CM | POA: Diagnosis not present

## 2020-02-06 DIAGNOSIS — I10 Essential (primary) hypertension: Secondary | ICD-10-CM | POA: Diagnosis not present

## 2020-02-14 DIAGNOSIS — J449 Chronic obstructive pulmonary disease, unspecified: Secondary | ICD-10-CM | POA: Diagnosis not present

## 2020-02-18 DIAGNOSIS — I2510 Atherosclerotic heart disease of native coronary artery without angina pectoris: Secondary | ICD-10-CM | POA: Diagnosis not present

## 2020-02-18 DIAGNOSIS — Z720 Tobacco use: Secondary | ICD-10-CM | POA: Diagnosis not present

## 2020-02-18 DIAGNOSIS — M1991 Primary osteoarthritis, unspecified site: Secondary | ICD-10-CM | POA: Diagnosis not present

## 2020-02-18 DIAGNOSIS — J449 Chronic obstructive pulmonary disease, unspecified: Secondary | ICD-10-CM | POA: Diagnosis not present

## 2020-02-23 DIAGNOSIS — Z6824 Body mass index (BMI) 24.0-24.9, adult: Secondary | ICD-10-CM | POA: Diagnosis not present

## 2020-02-23 DIAGNOSIS — K5730 Diverticulosis of large intestine without perforation or abscess without bleeding: Secondary | ICD-10-CM | POA: Diagnosis not present

## 2020-02-23 DIAGNOSIS — R39198 Other difficulties with micturition: Secondary | ICD-10-CM | POA: Diagnosis not present

## 2020-02-25 ENCOUNTER — Emergency Department (HOSPITAL_COMMUNITY): Payer: Medicare Other

## 2020-02-25 ENCOUNTER — Inpatient Hospital Stay (HOSPITAL_COMMUNITY)
Admission: EM | Admit: 2020-02-25 | Discharge: 2020-02-28 | DRG: 872 | Disposition: A | Discharge status: Home / Self Care | Payer: Medicare Other | Attending: Family Medicine | Admitting: Family Medicine

## 2020-02-25 ENCOUNTER — Other Ambulatory Visit: Payer: Self-pay

## 2020-02-25 ENCOUNTER — Encounter (HOSPITAL_COMMUNITY): Payer: Self-pay | Admitting: *Deleted

## 2020-02-25 DIAGNOSIS — N138 Other obstructive and reflux uropathy: Secondary | ICD-10-CM | POA: Diagnosis not present

## 2020-02-25 DIAGNOSIS — F419 Anxiety disorder, unspecified: Secondary | ICD-10-CM | POA: Diagnosis present

## 2020-02-25 DIAGNOSIS — G894 Chronic pain syndrome: Secondary | ICD-10-CM | POA: Diagnosis not present

## 2020-02-25 DIAGNOSIS — Z888 Allergy status to other drugs, medicaments and biological substances status: Secondary | ICD-10-CM

## 2020-02-25 DIAGNOSIS — Z7982 Long term (current) use of aspirin: Secondary | ICD-10-CM | POA: Diagnosis not present

## 2020-02-25 DIAGNOSIS — Z9981 Dependence on supplemental oxygen: Secondary | ICD-10-CM

## 2020-02-25 DIAGNOSIS — Z20822 Contact with and (suspected) exposure to covid-19: Secondary | ICD-10-CM | POA: Diagnosis present

## 2020-02-25 DIAGNOSIS — I2510 Atherosclerotic heart disease of native coronary artery without angina pectoris: Secondary | ICD-10-CM | POA: Diagnosis present

## 2020-02-25 DIAGNOSIS — A414 Sepsis due to anaerobes: Secondary | ICD-10-CM | POA: Diagnosis not present

## 2020-02-25 DIAGNOSIS — Z87891 Personal history of nicotine dependence: Secondary | ICD-10-CM

## 2020-02-25 DIAGNOSIS — J449 Chronic obstructive pulmonary disease, unspecified: Secondary | ICD-10-CM | POA: Diagnosis not present

## 2020-02-25 DIAGNOSIS — Z79899 Other long term (current) drug therapy: Secondary | ICD-10-CM

## 2020-02-25 DIAGNOSIS — F1120 Opioid dependence, uncomplicated: Secondary | ICD-10-CM | POA: Diagnosis present

## 2020-02-25 DIAGNOSIS — I10 Essential (primary) hypertension: Secondary | ICD-10-CM | POA: Diagnosis present

## 2020-02-25 DIAGNOSIS — R109 Unspecified abdominal pain: Secondary | ICD-10-CM | POA: Diagnosis not present

## 2020-02-25 DIAGNOSIS — A419 Sepsis, unspecified organism: Secondary | ICD-10-CM | POA: Diagnosis not present

## 2020-02-25 DIAGNOSIS — K529 Noninfective gastroenteritis and colitis, unspecified: Secondary | ICD-10-CM | POA: Diagnosis not present

## 2020-02-25 DIAGNOSIS — K219 Gastro-esophageal reflux disease without esophagitis: Secondary | ICD-10-CM | POA: Diagnosis not present

## 2020-02-25 DIAGNOSIS — Z7951 Long term (current) use of inhaled steroids: Secondary | ICD-10-CM | POA: Diagnosis not present

## 2020-02-25 DIAGNOSIS — J439 Emphysema, unspecified: Secondary | ICD-10-CM

## 2020-02-25 DIAGNOSIS — A0472 Enterocolitis due to Clostridium difficile, not specified as recurrent: Principal | ICD-10-CM | POA: Diagnosis present

## 2020-02-25 DIAGNOSIS — N401 Enlarged prostate with lower urinary tract symptoms: Secondary | ICD-10-CM | POA: Diagnosis present

## 2020-02-25 DIAGNOSIS — E785 Hyperlipidemia, unspecified: Secondary | ICD-10-CM | POA: Diagnosis not present

## 2020-02-25 LAB — URINALYSIS, ROUTINE W REFLEX MICROSCOPIC
Bilirubin Urine: NEGATIVE
Glucose, UA: NEGATIVE mg/dL
Hgb urine dipstick: NEGATIVE
Ketones, ur: 20 mg/dL — AB
Leukocytes,Ua: NEGATIVE
Nitrite: NEGATIVE
Protein, ur: NEGATIVE mg/dL
Specific Gravity, Urine: >1.046 — ABNORMAL HIGH (ref 1.005–1.030)
pH: 5 (ref 5.0–8.0)

## 2020-02-25 LAB — CBC WITH DIFFERENTIAL/PLATELET
Abs Immature Granulocytes: 0.32 10*3/uL — ABNORMAL HIGH (ref 0.00–0.07)
Basophils Absolute: 0.1 10*3/uL (ref 0.0–0.1)
Basophils Relative: 0 %
Eosinophils Absolute: 0.1 10*3/uL (ref 0.0–0.5)
Eosinophils Relative: 0 %
HCT: 44 % (ref 39.0–52.0)
Hemoglobin: 14.3 g/dL (ref 13.0–17.0)
Immature Granulocytes: 1 %
Lymphocytes Relative: 4 %
Lymphs Abs: 1.2 10*3/uL (ref 0.7–4.0)
MCH: 31.3 pg (ref 26.0–34.0)
MCHC: 32.5 g/dL (ref 30.0–36.0)
MCV: 96.3 fL (ref 80.0–100.0)
Monocytes Absolute: 1.3 10*3/uL — ABNORMAL HIGH (ref 0.1–1.0)
Monocytes Relative: 4 %
Neutro Abs: 28.7 10*3/uL — ABNORMAL HIGH (ref 1.7–7.7)
Neutrophils Relative %: 91 %
Platelets: 399 10*3/uL (ref 150–400)
RBC: 4.57 MIL/uL (ref 4.22–5.81)
RDW: 12.7 % (ref 11.5–15.5)
WBC: 31.7 10*3/uL — ABNORMAL HIGH (ref 4.0–10.5)
nRBC: 0 % (ref 0.0–0.2)

## 2020-02-25 LAB — RESPIRATORY PANEL BY RT PCR (FLU A&B, COVID)
Influenza A by PCR: NEGATIVE
Influenza B by PCR: NEGATIVE
SARS Coronavirus 2 by RT PCR: NEGATIVE

## 2020-02-25 LAB — COMPREHENSIVE METABOLIC PANEL
ALT: 17 U/L (ref 0–44)
AST: 13 U/L — ABNORMAL LOW (ref 15–41)
Albumin: 3.7 g/dL (ref 3.5–5.0)
Alkaline Phosphatase: 59 U/L (ref 38–126)
Anion gap: 9 (ref 5–15)
BUN: 13 mg/dL (ref 8–23)
CO2: 25 mmol/L (ref 22–32)
Calcium: 8.7 mg/dL — ABNORMAL LOW (ref 8.9–10.3)
Chloride: 104 mmol/L (ref 98–111)
Creatinine, Ser: 0.98 mg/dL (ref 0.61–1.24)
GFR, Estimated: >60 mL/min (ref >60)
Glucose, Bld: 107 mg/dL — ABNORMAL HIGH (ref 70–99)
Potassium: 4.3 mmol/L (ref 3.5–5.1)
Sodium: 138 mmol/L (ref 135–145)
Total Bilirubin: 0.4 mg/dL (ref 0.3–1.2)
Total Protein: 6.4 g/dL — ABNORMAL LOW (ref 6.5–8.1)

## 2020-02-25 LAB — C DIFFICILE QUICK SCREEN W PCR REFLEX
C Diff antigen: POSITIVE — AB
C Diff interpretation: DETECTED
C Diff toxin: POSITIVE — AB

## 2020-02-25 MED ORDER — DULOXETINE HCL 20 MG PO CPEP
20.0000 mg | ORAL_CAPSULE | Freq: Every day | ORAL | Status: DC
  Administered 2020-02-26 – 2020-02-27 (×3): 20 mg via ORAL
  Filled 2020-02-25 (×3): qty 1

## 2020-02-25 MED ORDER — ALBUTEROL SULFATE HFA 108 (90 BASE) MCG/ACT IN AERS: 2.0000 | INHALATION_SPRAY | Freq: Four times a day (QID) | RESPIRATORY_TRACT | Status: DC | PRN

## 2020-02-25 MED ORDER — SODIUM CHLORIDE 0.9 % IV BOLUS
1000.0000 mL | Freq: Once | INTRAVENOUS | Status: AC
  Administered 2020-02-25: 1000 mL via INTRAVENOUS

## 2020-02-25 MED ORDER — OXYCODONE-ACETAMINOPHEN 7.5-325 MG PO TABS
1.0000 | ORAL_TABLET | ORAL | Status: DC | PRN
  Administered 2020-02-26 (×2): 1 via ORAL
  Filled 2020-02-25 (×2): qty 1

## 2020-02-25 MED ORDER — TAMSULOSIN HCL 0.4 MG PO CAPS
0.4000 mg | ORAL_CAPSULE | Freq: Every day | ORAL | Status: DC
  Administered 2020-02-26 – 2020-02-27 (×2): 0.4 mg via ORAL
  Filled 2020-02-25 (×2): qty 1

## 2020-02-25 MED ORDER — ONDANSETRON HCL 4 MG PO TABS: 4.0000 mg | ORAL_TABLET | Freq: Four times a day (QID) | ORAL | Status: DC | PRN

## 2020-02-25 MED ORDER — HYPROMELLOSE (GONIOSCOPIC) 2.5 % OP SOLN
1.0000 [drp] | Freq: Two times a day (BID) | OPHTHALMIC | Status: DC | PRN
  Filled 2020-02-25: qty 15

## 2020-02-25 MED ORDER — AMLODIPINE BESYLATE 5 MG PO TABS
10.0000 mg | ORAL_TABLET | Freq: Every day | ORAL | Status: DC
  Administered 2020-02-26 – 2020-02-28 (×3): 10 mg via ORAL
  Filled 2020-02-25 (×3): qty 2

## 2020-02-25 MED ORDER — DICYCLOMINE HCL 10 MG PO CAPS
10.0000 mg | ORAL_CAPSULE | Freq: Once | ORAL | Status: AC
  Administered 2020-02-25: 10 mg via ORAL
  Filled 2020-02-25: qty 1

## 2020-02-25 MED ORDER — FINASTERIDE 5 MG PO TABS
5.0000 mg | ORAL_TABLET | Freq: Every day | ORAL | Status: DC
  Administered 2020-02-26 – 2020-02-28 (×3): 5 mg via ORAL
  Filled 2020-02-25 (×3): qty 1

## 2020-02-25 MED ORDER — SACCHAROMYCES BOULARDII 250 MG PO CAPS
250.0000 mg | ORAL_CAPSULE | Freq: Two times a day (BID) | ORAL | Status: DC
  Administered 2020-02-26 – 2020-02-28 (×6): 250 mg via ORAL
  Filled 2020-02-25 (×6): qty 1

## 2020-02-25 MED ORDER — VANCOMYCIN 50 MG/ML ORAL SOLUTION
125.0000 mg | Freq: Four times a day (QID) | ORAL | Status: DC
  Administered 2020-02-25 – 2020-02-28 (×10): 125 mg via ORAL
  Filled 2020-02-25 (×16): qty 2.5

## 2020-02-25 MED ORDER — ASPIRIN EC 81 MG PO TBEC
162.0000 mg | DELAYED_RELEASE_TABLET | Freq: Every day | ORAL | Status: DC
  Administered 2020-02-26 – 2020-02-28 (×3): 162 mg via ORAL
  Filled 2020-02-25 (×4): qty 2

## 2020-02-25 MED ORDER — FLUDROCORTISONE ACETATE 0.1 MG PO TABS
0.1000 mg | ORAL_TABLET | Freq: Every day | ORAL | Status: DC
  Administered 2020-02-26 – 2020-02-28 (×3): 0.1 mg via ORAL
  Filled 2020-02-25 (×3): qty 1

## 2020-02-25 MED ORDER — FLUTICASONE FUROATE-VILANTEROL 200-25 MCG/INH IN AEPB
1.0000 | INHALATION_SPRAY | Freq: Every day | RESPIRATORY_TRACT | Status: DC
  Administered 2020-02-26 – 2020-02-28 (×3): 1 via RESPIRATORY_TRACT
  Filled 2020-02-25: qty 28

## 2020-02-25 MED ORDER — ONDANSETRON HCL 4 MG/2ML IJ SOLN: 4.0000 mg | Freq: Four times a day (QID) | INTRAMUSCULAR | Status: DC | PRN

## 2020-02-25 MED ORDER — SODIUM CHLORIDE 0.9 % IV SOLN: INTRAVENOUS | Status: DC

## 2020-02-25 MED ORDER — TIZANIDINE HCL 4 MG PO TABS: 4.0000 mg | ORAL_TABLET | Freq: Every day | ORAL | Status: DC | PRN

## 2020-02-25 MED ORDER — ENOXAPARIN SODIUM 40 MG/0.4ML ~~LOC~~ SOLN
40.0000 mg | SUBCUTANEOUS | Status: DC
  Administered 2020-02-26 – 2020-02-27 (×3): 40 mg via SUBCUTANEOUS
  Filled 2020-02-25 (×3): qty 0.4

## 2020-02-25 MED ORDER — HYDROMORPHONE HCL 1 MG/ML IJ SOLN
0.5000 mg | Freq: Once | INTRAMUSCULAR | Status: AC
  Administered 2020-02-25: 0.5 mg via INTRAVENOUS
  Filled 2020-02-25: qty 1

## 2020-02-25 MED ORDER — IOHEXOL 300 MG/ML  SOLN
100.0000 mL | Freq: Once | INTRAMUSCULAR | Status: AC | PRN
  Administered 2020-02-25: 100 mL via INTRAVENOUS

## 2020-02-25 NOTE — ED Notes (Signed)
Pt reports that he has had diarrhea x 2 weeks  Seen at Dr Sharyon Medicus office and given pills"   Has hx of constipation, but no check by staff for same   He reports he is here because he is not having loose stools per say, but he is "having a lot of gas" as well as lower abd pain

## 2020-02-25 NOTE — ED Notes (Signed)
Pt reports he has no pain   "after a blow out"

## 2020-02-25 NOTE — ED Notes (Signed)
Call to Dr Kathie Rhodes after lab called critical result of positive C diff

## 2020-02-25 NOTE — ED Notes (Signed)
Urine spec to lab 

## 2020-02-25 NOTE — ED Notes (Signed)
Report to Patricia, RN.

## 2020-02-25 NOTE — ED Notes (Signed)
To CT

## 2020-02-25 NOTE — H&P (Addendum)
History and Physical  [name] ZWC:[id number] DOB: 29-Dec-1950 DOA: 02/25/2020  Referring physician: Arthor Captain, PA-C, EDP PCP: Elfredia Nevins, MD  Outpatient Specialists:   Patient Coming From: home  Chief Complaint: diarrhea, abdominal pain  HPI: [name] is a 69 y.o. male with a history of COPD, hypertension, chronic pain with opioid dependence, coronary artery disease, anxiety.  Patient presents with abdominal pain and diarrhea for the past 2 weeks.  This started after getting antibiotics for a sinus infection.  He supposedly has seen his primary care physician who gave him some medicine for the diarrhea, but that did not help.  Pain does not radiate.  His symptoms are worsening.  He does have diminished appetite.  Denies nausea and vomiting, fevers chills, shortness of breath and chest pain.  Emergency Department Course: Had diarrhea in ED.  White count 30.  CT shows right-sided colitis  Review of Systems:   Pt denies any fevers, chills, nausea, vomiting, constipation, abdominal pain, shortness of breath, dyspnea on exertion, orthopnea, cough, wheezing, palpitations, headache, vision changes, lightheadedness, dizziness, melena, rectal bleeding.  Review of systems are otherwise negative  Past Medical History:  Diagnosis Date  . Anxiety   . CAD (coronary artery disease)   . Chronic pain   . 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 Dyslipidemia   . History of tobacco abuse    80 pack year history   . Hypertension   . On home O2    4L N/C  . Opioid dependence (HCC)   . Respiratory failure [facility])    Past Surgical History:  Procedure Laterality Date  . CARDIAC CATHETERIZATION  08/10/2008   right & left - normal coronaries (Dr. Claudia Desanctis)  . NM MYOCAR PERF WALL MOTION  07/2008   bruce myoview - mild ischemia in basal inferoseptal & mid inferoseptal regions; EF 63%  . TRANSTHORACIC ECHOCARDIOGRAM  07/2008   EF=>55%; RV mildly dilated; RA  mild-mod dilated; mild mitral annular calcification & mild MR; AV mildly sclerotic   Social History:  reports that he quit smoking about 12 years ago. He started smoking about 45 years ago. He has never used smokeless tobacco. He reports that he does not drink alcohol and does not use drugs. Patient lives at home  Allergies  Allergen Reactions  . Ativan [Lorazepam] Other (See Comments)    Dry mouth sinus and breathing problems     Family History  Problem Relation Age of Onset  . Leukemia Mother   . Suicidality Father       Prior to Admission medications   Medication Sig Start Date End Date Taking? Authorizing Provider  albuterol (PROVENTIL HFA;VENTOLIN HFA) 108 (90 BASE) MCG/ACT inhaler Inhale 2 puffs into the lungs every 6 (six) hours as needed for wheezing or shortness of breath.    [provider]  albuterol (PROVENTIL) (2.5 MG/3ML) 0.083% nebulizer solution Take 3 mLs (2.5 mg total) by nebulization every 6 (six) hours as needed for wheezing or shortness of breath. 11/29/17   Vassie Loll, MD  amLODipine (NORVASC) 5 MG tablet Take 10 mg by mouth daily.  10/14/16   [provider]  aspirin EC 81 MG tablet Take 2 tablets (162 mg total) by mouth daily. 11/29/17   Vassie Loll, MD  diazepam (VALIUM) 10 MG tablet Take 10 mg by mouth 3 (three) times daily. Reported on 07/15/2015    [provider]  DULoxetine (CYMBALTA) 20 MG capsule Take 20 mg  by mouth at bedtime.     [provider]  finasteride (PROSCAR) 5 MG tablet Take 1 tablet (5 mg total) by mouth daily. 11/07/19   McKenzie, Mardene Celeste, MD  fludrocortisone (FLORINEF) 0.1 MG tablet Take 0.1 mg by mouth daily.  11/01/16   [provider]  furosemide (LASIX) 20 MG tablet Take 1 tablet by mouth daily. 11/17/17   [provider]  lactulose (CHRONULAC) 10 GM/15ML solution 10 g 4 (four) times daily.  02/11/16   [provider]  Multiple Vitamin (MULTIVITAMIN WITH MINERALS) TABS  tablet Take 1 tablet by mouth daily.    [provider]  omeprazole (PRILOSEC) 40 MG capsule Take 40 mg by mouth daily.  01/10/13   [provider]  ondansetron (ZOFRAN) 8 MG tablet Take 1 tablet by mouth daily. 11/21/17   [provider]  oxyCODONE-acetaminophen (PERCOCET) 7.5-325 MG per tablet Take 1 tablet by mouth every 4 (four) hours as needed for pain.    [provider]  OXYGEN Inhale 4 L into the lungs continuous. Patient not taking: Reported on 11/07/2019    [provider]  potassium chloride SA (KLOR-CON) 20 MEQ tablet Take 20 mEq by mouth daily. 08/13/19   [provider]  predniSONE (DELTASONE) 20 MG tablet Take 3 tablets by mouth daily X 1 day; then 2 tablets by mouth daily X 2 days; then 1 tablet by mouth daily X 2 days; then 1/2 tablet by mouth daily X 3 days and stop prednisone. Patient not taking: Reported on 08/18/2019 11/29/17   Vassie Loll, MD  Propylene Glycol (SYSTANE BALANCE) 0.6 % SOLN Place 1 drop into both eyes 2 (two) times daily as needed (dry eyes).    [provider]  simvastatin (ZOCOR) 20 MG tablet TAKE 1 TABLET BY MOUTH DAILY AT 6 PM. 09/21/15   Laqueta Linden, MD  SYMBICORT 160-4.5 MCG/ACT inhaler Inhale 2 puffs into the lungs 2 (two) times daily. 12/21/15   [provider]  tadalafil (CIALIS) 20 MG tablet Take 1 tablet (20 mg total) by mouth daily as needed for erectile dysfunction. 11/07/19   McKenzie, Mardene Celeste, MD  tamsulosin (FLOMAX) 0.4 MG CAPS capsule Take 1 capsule (0.4 mg total) by mouth in the morning and at bedtime. 11/07/19   McKenzie, Mardene Celeste, MD  tiZANidine (ZANAFLEX) 4 MG tablet Take 1 tablet by mouth daily as needed for muscle spasms. 01/03/17   [provider]    Physical Exam: BP (!) 147/91   Pulse 96   Temp 97.9 F (36.6 C) (Oral)   Resp 12   SpO2 94%   . General: Elderly male. Awake and alert and oriented x3. No acute cardiopulmonary distress.  Marland Kitchen HEENT:  Normocephalic atraumatic.  Right and left ears normal in appearance.  Pupils equal, round, reactive to light. Extraocular muscles are intact. Sclerae anicteric and noninjected.  Moist mucosal membranes. No mucosal lesions.  . Neck: Neck supple without lymphadenopathy. No carotid bruits. No masses palpated.  . Cardiovascular: Regular rate with normal S1-S2 sounds. No murmurs, rubs, gallops auscultated. No JVD.  Marland Kitchen Respiratory: Good respiratory effort with no wheezes, rales, rhonchi. Lungs clear to auscultation bilaterally.  No accessory muscle use. . Abdomen: Soft, right lower quadrant tenderness without rebound or guarding, nondistended. Active bowel sounds. No masses or hepatosplenomegaly  . Skin: No rashes, lesions, or ulcerations.  Dry, warm to touch. 2+ dorsalis pedis and radial pulses. . Musculoskeletal: No calf or leg pain. All major joints not erythematous  nontender.  No upper or lower joint deformation.  Good ROM.  No contractures  . Psychiatric: Intact judgment and insight. Pleasant and cooperative. . Neurologic: No focal neurological deficits. Strength is 5/5 and symmetric in upper and lower extremities.  Cranial nerves II through XII are grossly intact.           Labs on Admission: I have personally reviewed following labs and imaging studies  CBC: Recent Labs  Lab 02/25/20 1553  WBC 31.7*  NEUTROABS 28.7*  HGB 14.3  HCT 44.0  MCV 96.3  PLT 399   Basic Metabolic Panel: Recent Labs  Lab 02/25/20 1553  NA 138  K 4.3  CL 104  CO2 25  GLUCOSE 107*  BUN 13  CREATININE 0.98  CALCIUM 8.7*   GFR: CrCl cannot be calculated (Unknown ideal weight.). Liver Function Tests: Recent Labs  Lab 02/25/20 1553  AST 13*  ALT 17  ALKPHOS 59  BILITOT 0.4  PROT 6.4*  ALBUMIN 3.7   No results for input(s): LIPASE, AMYLASE in the last 168 hours. No results for input(s): AMMONIA in the last 168 hours. Coagulation Profile: No results for input(s): INR, PROTIME in the last 168  hours. Cardiac Enzymes: No results for input(s): CKTOTAL, CKMB, CKMBINDEX, TROPONINI in the last 168 hours. BNP (last 3 results) No results for input(s): PROBNP in the last 8760 hours. HbA1C: No results for input(s): HGBA1C in the last 72 hours. CBG: No results for input(s): GLUCAP in the last 168 hours. Lipid Profile: No results for input(s): CHOL, HDL, LDLCALC, TRIG, CHOLHDL, LDLDIRECT in the last 72 hours. Thyroid Function Tests: No results for input(s): TSH, T4TOTAL, FREET4, T3FREE, THYROIDAB in the last 72 hours. Anemia Panel: No results for input(s): VITAMINB12, FOLATE, FERRITIN, TIBC, IRON, RETICCTPCT in the last 72 hours. Urine analysis:    Component Value Date/Time   COLORURINE YELLOW 02/25/2020 1531   APPEARANCEUR CLEAR 02/25/2020 1531   LABSPEC >1.046 (H) 02/25/2020 1531   PHURINE 5.0 02/25/2020 1531   GLUCOSEU NEGATIVE 02/25/2020 1531   HGBUR NEGATIVE 02/25/2020 1531   BILIRUBINUR NEGATIVE 02/25/2020 1531   KETONESUR 20 (A) 02/25/2020 1531   PROTEINUR NEGATIVE 02/25/2020 1531   NITRITE NEGATIVE 02/25/2020 1531   LEUKOCYTESUR NEGATIVE 02/25/2020 1531   Sepsis Labs: @LABRCNTIP (procalcitonin:4,lacticidven:4) )No results found for this or any previous visit (from the past 240 hour(s)).   Radiological Exams on Admission: CT ABDOMEN PELVIS W CONTRAST  Result Date: 02/25/2020 CLINICAL DATA:  Pt c/o lower abdominal pain x 2 weeks Denies n/v/d EXAM: CT ABDOMEN AND PELVIS WITH CONTRAST TECHNIQUE: Multidetector CT imaging of the abdomen and pelvis was performed using the standard protocol following bolus administration of intravenous contrast. CONTRAST:  13/09/2019 OMNIPAQUE IOHEXOL 300 MG/ML  SOLN COMPARISON:  CT abdomen pelvis 01/06/2017 FINDINGS: Lower chest: No acute abnormality. Stable small subpleural nodule in the left lung base. Hepatobiliary: No focal liver abnormality is seen. No gallstones, gallbladder wall thickening, or biliary dilatation. Pancreas: Unremarkable. No  pancreatic ductal dilatation or surrounding inflammatory changes. Spleen: Normal in size without focal abnormality. Adrenals/Urinary Tract: Adrenal glands are unremarkable. Tiny hypodensity in the superior right kidney which is too small to fully characterize but likely represents a small cyst. No hydronephrosis or renal calculi bilaterally. No suspicious solid mass. Unremarkable urinary bladder. Stomach/Bowel: Stomach is within normal limits. There is diffuse bowel wall thickening most prominent in the ascending colon and cecum with surrounding pericolonic fat stranding. No evidence of obstruction. No evidence of free air. Vascular/Lymphatic: Aortic atherosclerosis. No enlarged  abdominal or pelvic lymph nodes. Reproductive: Redemonstrated ovoid cyst measuring 1.7 cm the base of the penis, possibly a benign cyst or urethral diverticulum. Other: Small fat containing umbilical hernia as well as another small fat containing midline hernia just above the umbilicus. Musculoskeletal: No acute or significant osseous findings. IMPRESSION: 1. Diffuse bowel wall thickening most prominent in the ascending colon and cecum with surrounding pericolonic fat stranding, consistent with colitis, which may be infectious or inflammatory in etiology. 2. Small fat containing umbilical hernia as well as another small fat containing midline hernia just above the umbilicus. 3. Redemonstrated ovoid cyst measuring 1.7 cm the base of the penis, possibly a benign cyst or urethral diverticulum. 4. Aortic atherosclerosis. Aortic Atherosclerosis (ICD10-I70.0). Electronically Signed   By: Emmaline KluverNancy  Ballantyne M.D.   On: 02/25/2020 17:26      Assessment/Plan: Principal Problem:   Colitis Active Problems:   COPD (chronic obstructive pulmonary disease) (HCC)   Anxiety   GERD (gastroesophageal reflux disease)   Chronic pain syndrome   Benign prostatic hyperplasia with urinary obstruction    This patient was discussed with the ED physician,  including pertinent vitals, physical exam findings, labs, and imaging.  We also discussed care given by the ED provider.  1. Colitis a. Admit b. IV fluids c. C. difficile, stool studies pending d. Clear liquid diet e. Start Florastor f. Repeat CBC in the morning 2. Chronic pain syndrome a. Continue oxycodone 3. COPD a. Continue home regimen 4. Anxiety a. Home meds 5. BPH a. Continue Proscar and Flomax  DVT prophylaxis: Lovenox Consultants: None Code Status: Full code Family Communication: None Disposition Plan: Patient should be able to return home following admission   Levie HeritageStinson, Lui Bellis J, DO  Addendum: C diff studies returned positive for antigen and toxin. Start Vancomycin PO.  Levie HeritageJacob J Marylon Verno, DO

## 2020-02-25 NOTE — ED Triage Notes (Signed)
Diarrhea for 2 weeks °

## 2020-02-25 NOTE — ED Provider Notes (Signed)
[facility] EMERGENCY DEPARTMENT Provider Note   CSN: [id number] Arrival date & time: 02/25/20  1358     History Chief Complaint  Patient presents with  . Diarrhea    [name] is a 69 y.o. male.   Diarrhea Quality:  Mucous Severity:  Severe Onset quality:  Sudden Number of episodes:  Q 5 min Duration:  2 weeks Timing:  Constant Progression:  Unchanged Relieved by:  Nothing Worsened by:  Nothing Ineffective treatments: medication pescribed by his doctor (unable to tell me what) Associated symptoms: abdominal pain   Associated symptoms: no arthralgias, no chills, no recent cough, no diaphoresis, no fever, no headaches, no myalgias, no URI and no vomiting   Risk factors: no recent antibiotic use, no sick contacts, no suspicious food intake and no travel to endemic areas        Past Medical History:  Diagnosis Date  . Anxiety   . CAD (coronary artery disease)   . Chronic pain   . 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 Dyslipidemia   . History of tobacco abuse    80 pack year history   . Hypertension   . On home O2    4L N/C  . Opioid dependence (HCC)   . Respiratory failure [facility])     Patient Active Problem List   Diagnosis Date Noted  . Urinary retention 11/07/2019  . Erectile dysfunction due to arterial insufficiency 11/07/2019  . Acute on chronic respiratory failure (HCC) 11/27/2017  . Benign prostatic hyperplasia with urinary obstruction 11/27/2017  . Cough with hemoptysis 11/13/2016  . 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 Vitamin D insufficiency 04/01/2016  . Elevated C-reactive protein (CRP) 03/28/2016  . Chronic pain syndrome 03/26/2016  . Chronic bilateral low back pain without sciatica (R>L) 03/26/2016  . Long term current use of opiate analgesic 03/26/2016  . Long term prescription opiate use 03/26/2016  . Encounter for therapeutic drug level monitoring 03/26/2016  . Encounter for pain management planning 03/26/2016  .  Chronic sacroiliac joint pain (B) (R>L) 03/26/2016  . Chronic hip pain, left 03/26/2016  . Chronic right hip pain 03/26/2016  . Chronic hip pain (B) (L>R) 03/26/2016  . Chronic left sacroiliac pain 03/26/2016  . Chronic right sacroiliac pain 03/26/2016  . HCAP (healthcare-associated pneumonia) 01/08/2016  . PNA (pneumonia) 12/25/2015  . Chronic respiratory failure with hypoxia (HCC) 12/25/2015  . COPD exacerbation (HCC)   . Sepsis (HCC) 07/03/2015  . CAP (community acquired pneumonia) 07/03/2015  . Acute on chronic respiratory failure with hypoxia (HCC) 07/03/2015  . GERD (gastroesophageal reflux disease) 07/03/2015  . Coronary artery disease 10/13/2013  . Anxiety 10/13/2013  . Dyslipidemia 06/23/2013  . COPD (chronic obstructive pulmonary disease) (HCC) 06/23/2013  . SHOULDER PAIN 08/16/2008  . SHOULDER IMPINGEMENT SYNDROME, LEFT 08/16/2008    Past Surgical History:  Procedure Laterality Date  . CARDIAC CATHETERIZATION  08/10/2008   right & left - normal coronaries (Dr. Claudia Desanctis)  . NM MYOCAR PERF WALL MOTION  07/2008   bruce myoview - mild ischemia in basal inferoseptal & mid inferoseptal regions; EF 63%  . TRANSTHORACIC ECHOCARDIOGRAM  07/2008   EF=>55%; RV mildly dilated; RA mild-mod dilated; mild mitral annular calcification & mild MR; AV mildly sclerotic       Family History  Problem Relation Age of Onset  . Leukemia Mother   . Suicidality Father     Social History   Tobacco Use  . Smoking status: Former Smoker  Start date: 04/21/1974    Quit date: 06/22/2007    Years since quitting: 12.6  . Smokeless tobacco: Never Used  Substance Use Topics  . Alcohol use: No    Alcohol/week: 0.0 standard drinks  . Drug use: No    Home Medications Prior to Admission medications   Medication Sig Start Date End Date Taking? Authorizing Provider  albuterol (PROVENTIL HFA;VENTOLIN HFA) 108 (90 BASE) MCG/ACT inhaler Inhale 2 puffs into the lungs every 6 (six) hours as needed  for wheezing or shortness of breath.    [provider]  albuterol (PROVENTIL) (2.5 MG/3ML) 0.083% nebulizer solution Take 3 mLs (2.5 mg total) by nebulization every 6 (six) hours as needed for wheezing or shortness of breath. 11/29/17   Vassie Loll, MD  amLODipine (NORVASC) 5 MG tablet Take 10 mg by mouth daily.  10/14/16   [provider]  aspirin EC 81 MG tablet Take 2 tablets (162 mg total) by mouth daily. 11/29/17   Vassie Loll, MD  diazepam (VALIUM) 10 MG tablet Take 10 mg by mouth 3 (three) times daily. Reported on 07/15/2015    [provider]  DULoxetine (CYMBALTA) 20 MG capsule Take 20 mg by mouth at bedtime.     [provider]  finasteride (PROSCAR) 5 MG tablet Take 1 tablet (5 mg total) by mouth daily. 11/07/19   McKenzie, Mardene Celeste, MD  fludrocortisone (FLORINEF) 0.1 MG tablet Take 0.1 mg by mouth daily.  11/01/16   [provider]  furosemide (LASIX) 20 MG tablet Take 1 tablet by mouth daily. 11/17/17   [provider]  lactulose (CHRONULAC) 10 GM/15ML solution 10 g 4 (four) times daily.  02/11/16   [provider]  Multiple Vitamin (MULTIVITAMIN WITH MINERALS) TABS tablet Take 1 tablet by mouth daily.    [provider]  omeprazole (PRILOSEC) 40 MG capsule Take 40 mg by mouth daily.  01/10/13   [provider]  ondansetron (ZOFRAN) 8 MG tablet Take 1 tablet by mouth daily. 11/21/17   [provider]  oxyCODONE-acetaminophen (PERCOCET) 7.5-325 MG per tablet Take 1 tablet by mouth every 4 (four) hours as needed for pain.    [provider]  OXYGEN Inhale 4 L into the lungs continuous. Patient not taking: Reported on 11/07/2019    [provider]  potassium chloride SA (KLOR-CON) 20 MEQ tablet Take 20 mEq by mouth daily. 08/13/19   [provider]  predniSONE (DELTASONE) 20 MG tablet Take 3 tablets by mouth daily X 1 day; then 2 tablets by mouth daily X 2 days; then 1 tablet by  mouth daily X 2 days; then 1/2 tablet by mouth daily X 3 days and stop prednisone. Patient not taking: Reported on 08/18/2019 11/29/17   Vassie Loll, MD  Propylene Glycol (SYSTANE BALANCE) 0.6 % SOLN Place 1 drop into both eyes 2 (two) times daily as needed (dry eyes).    [provider]  simvastatin (ZOCOR) 20 MG tablet TAKE 1 TABLET BY MOUTH DAILY AT 6 PM. 09/21/15   Laqueta Linden, MD  SYMBICORT 160-4.5 MCG/ACT inhaler Inhale 2 puffs into the lungs 2 (two) times daily. 12/21/15   [provider]  tadalafil (CIALIS) 20 MG tablet Take 1 tablet (20 mg total) by mouth daily as needed for erectile dysfunction. 11/07/19   McKenzie, Mardene Celeste, MD  tamsulosin (FLOMAX) 0.4 MG CAPS capsule Take 1 capsule (0.4 mg total) by mouth in the morning and at bedtime. 11/07/19   Wilkie Aye  L, MD  tiZANidine (ZANAFLEX) 4 MG tablet Take 1 tablet by mouth daily as needed for muscle spasms. 01/03/17   [provider]    Allergies    Ativan [lorazepam]  Review of Systems   Review of Systems  Constitutional: Negative for chills, diaphoresis and fever.  HENT: Negative.   Eyes: Negative.   Respiratory: Negative.   Cardiovascular: Negative.   Gastrointestinal: Positive for abdominal pain and diarrhea. Negative for vomiting.  Genitourinary: Negative.   Musculoskeletal: Negative for arthralgias and myalgias.  Neurological: Negative for headaches.  Hematological: Negative.     Physical Exam Updated Vital Signs There were no vitals taken for this visit.  Physical Exam Vitals and nursing note reviewed.  Constitutional:      General: He is not in acute distress.    Appearance: He is well-developed. He is not diaphoretic.  HENT:     Head: Normocephalic and atraumatic.  Eyes:     General: No scleral icterus.    Conjunctiva/sclera: Conjunctivae normal.  Cardiovascular:     Rate and Rhythm: Normal rate and regular rhythm.     Heart sounds: Normal heart sounds.  Pulmonary:      Effort: Pulmonary effort is normal. No respiratory distress.     Breath sounds: Normal breath sounds.  Abdominal:     Palpations: Abdomen is soft.     Tenderness: There is abdominal tenderness. There is no right CVA tenderness or left CVA tenderness.    Musculoskeletal:     Cervical back: Normal range of motion and neck supple.  Skin:    General: Skin is warm and dry.  Neurological:     Mental Status: He is alert.  Psychiatric:        Behavior: Behavior normal.     ED Results / Procedures / Treatments   Labs (all labs ordered are listed, but only abnormal results are displayed) Labs Reviewed  GASTROINTESTINAL PANEL BY PCR, STOOL (REPLACES STOOL CULTURE)  C DIFFICILE QUICK SCREEN W PCR REFLEX  COMPREHENSIVE METABOLIC PANEL  CBC WITH DIFFERENTIAL/PLATELET  URINALYSIS, ROUTINE W REFLEX MICROSCOPIC    EKG None  Radiology No results found.  Procedures Procedures (including critical care time)  Medications Ordered in ED Medications  sodium chloride 0.9 % bolus 1,000 mL (has no administration in time range)  dicyclomine (BENTYL) capsule 10 mg (has no administration in time range)    ED Course  I have reviewed the triage vital signs and the nursing notes.  Pertinent labs & imaging results that were available during my care of the patient were reviewed by me and considered in my medical decision making (see chart for details).    MDM Rules/Calculators/A&P                          [id number] VS: BP (!) 147/91   Pulse 96   Temp 97.9 F (36.6 C) (Oral)   Resp 12   SpO2 94%  UJ:[id number] is gathered by patient and emr. Previous records obtained and reviewed. DDX:The patient's complaint of diarrhea involves an extensive number of diagnostic and treatment options, and is a complaint that carries with it a high risk of complications, morbidity, and potential mortality. Given the large differential diagnosis, medical decision making is of high complexity. The  differential diagnosis of diarrhea includes but is not limited to Viral- norovirus/rotavirus; Bacterial-Campylobacter,Shigella, Salmonella, Escherichia coli, E. coli 0157:H7, Yersinia enterocolitica, Vibrio cholerae, Clostridium difficile. Parasitic- Giardia lamblia, Cryptosporidium,Entamoeba histolytica,Cyclospora, Microsporidium. Toxin- Staphylococcus aureus,  Bacillus cereus. Noninfectious causes include GI Bleed, Appendicitis, Mesenteric Ischemia, Diverticulitis, Adrenal Crisis, Thyroid Storm, Toxicologic exposures, Antibiotic or drug-associated, inflammatory bowel disease.  Labs: I ordered reviewed and interpreted labs which include Cbc with marked leukocytosis and left shift cmp without sig abnormality UA without abnormality c-diff positive Imaging: I ordered and reviewed images which included ct abd /pelvis w/ contrast. I independently visualized and interpreted all imaging. Significant findings include diffuse colitis.  EKG: Consults: ZOX:WRUEAVWDM:patient with severe diarrhea c-diff positive. Will admit to the hospitalist.  Patient disposition:The patient appears reasonably stabilized for admission considering the current resources, flow, and capabilities available in the ED at this time, and I doubt any other [facility] requiring further screening and/or treatment in the ED prior to admission.        Final Clinical Impression(s) / ED Diagnoses Final diagnoses:  None    Rx / DC Orders ED Discharge Orders    None       Arthor CaptainHarris, Bettymae Yott, PA-C 02/25/20 2121    Bethann BerkshireZammit, Joseph, MD 02/26/20 [phone]

## 2020-02-26 DIAGNOSIS — K219 Gastro-esophageal reflux disease without esophagitis: Secondary | ICD-10-CM

## 2020-02-26 DIAGNOSIS — A0472 Enterocolitis due to Clostridium difficile, not specified as recurrent: Secondary | ICD-10-CM

## 2020-02-26 DIAGNOSIS — A419 Sepsis, unspecified organism: Secondary | ICD-10-CM

## 2020-02-26 LAB — CBC
HCT: 42.7 % (ref 39.0–52.0)
Hemoglobin: 14.1 g/dL (ref 13.0–17.0)
MCH: 31.8 pg (ref 26.0–34.0)
MCHC: 33 g/dL (ref 30.0–36.0)
MCV: 96.4 fL (ref 80.0–100.0)
Platelets: 387 10*3/uL (ref 150–400)
RBC: 4.43 MIL/uL (ref 4.22–5.81)
RDW: 12.7 % (ref 11.5–15.5)
WBC: 22 10*3/uL — ABNORMAL HIGH (ref 4.0–10.5)
nRBC: 0 % (ref 0.0–0.2)

## 2020-02-26 LAB — BASIC METABOLIC PANEL
Anion gap: 9 (ref 5–15)
BUN: 8 mg/dL (ref 8–23)
CO2: 24 mmol/L (ref 22–32)
Calcium: 8.8 mg/dL — ABNORMAL LOW (ref 8.9–10.3)
Chloride: 103 mmol/L (ref 98–111)
Creatinine, Ser: 0.69 mg/dL (ref 0.61–1.24)
GFR, Estimated: >60 mL/min (ref >60)
Glucose, Bld: 98 mg/dL (ref 70–99)
Potassium: 3.9 mmol/L (ref 3.5–5.1)
Sodium: 136 mmol/L (ref 135–145)

## 2020-02-26 LAB — HIV ANTIBODY (ROUTINE TESTING W REFLEX): HIV Screen 4th Generation wRfx: NONREACTIVE

## 2020-02-26 MED ORDER — ALBUTEROL SULFATE (2.5 MG/3ML) 0.083% IN NEBU
INHALATION_SOLUTION | RESPIRATORY_TRACT | Status: AC
  Filled 2020-02-26: qty 3

## 2020-02-26 MED ORDER — ALBUTEROL SULFATE (2.5 MG/3ML) 0.083% IN NEBU
2.5000 mg | INHALATION_SOLUTION | Freq: Three times a day (TID) | RESPIRATORY_TRACT | Status: DC
  Administered 2020-02-26 – 2020-02-28 (×7): 2.5 mg via RESPIRATORY_TRACT
  Filled 2020-02-26 (×6): qty 3

## 2020-02-26 MED ORDER — POLYVINYL ALCOHOL 1.4 % OP SOLN: 1.0000 [drp] | Freq: Two times a day (BID) | OPHTHALMIC | Status: DC | PRN

## 2020-02-26 NOTE — Progress Notes (Signed)
Patient Demographics:    [name], is a 69 y.o. male, DOB - [birth year], OHY:[id number]  Admit date - 02/25/2020   Admitting Physician Truett Mainland, DO  Outpatient Primary MD for the patient is Redmond School, MD  LOS - 1   Chief Complaint  Patient presents with  . Diarrhea        Subjective:    [name] today has no fevers, no emesis,  No chest pain,   -Diarrhea persist with more than 5 BMs in the last 12 hours -Fatigue and malaise persist  Assessment  & Plan :    Principal Problem:   Sepsis secondary to C. difficile colitis Active Problems:   C. difficile colitis   COPD (chronic obstructive pulmonary disease) (HCC)   Anxiety   GERD (gastroesophageal reflux disease)   Chronic pain syndrome   Benign prostatic hyperplasia with urinary obstruction  Brief Summary:-   69 y.o. male with a history of COPD, hypertension, chronic pain with opioid dependence, coronary artery disease, anxiety noted on 02/25/2020 with sepsis secondary to C. difficile colitis  A/p 1) sepsis secondary to C. difficile colitis--POA--patient met sepsis criteria on admission with leukocytosis, tachycardia, tachypnea and finding of C. difficile colitis -WBC was 31.7 --Oral vancomycin and IV fluids -Continue as needed opiates as well as antiemetics -Patient continues to have frequent stools and is at risk for dehydration  2)COPD-no acute exacerbation at this time, continue Breo Ellipta, may use albuterol as needed  3)BPH-stable, continue Proscar and Flomax  4) chronic pain syndrome--continue Zanaflex , Cymbalta and as needed oxycodone  5)HTN-stable, continue amlodipine 10 mg daily, patient is on Florinef 0.1 mg daily presumably for orthostatic concerns -Lasix is on hold due to diarrhea and risk of dehydration  Disposition/Need for in-Hospital Stay- patient unable to be discharged at this time due to --sepsis  secondary to C. difficile colitis requiring fluids and antibiotics  Status is: Inpatient  Remains inpatient appropriate because:sepsis secondary to C. difficile colitis requiring fluids and antibiotics  Disposition: The patient is from: Home              Anticipated d/c is to: Home              Anticipated d/c date is: 2 days              Patient currently is not medically stable to d/c. Barriers: Not Clinically Stable- sepsis secondary to C. difficile colitis requiring fluids and antibiotics  Code Status : full  Family Communication:   (patient is alert, awake and coherent)   Consults  :  na  DVT Prophylaxis  :  Lovenox -  - SCDs   Lab Results  Component Value Date   PLT 399 02/25/2020    Inpatient Medications  Scheduled Meds: . albuterol  2.5 mg Nebulization TID  . amLODipine  10 mg Oral Daily  . aspirin EC  162 mg Oral Daily  . DULoxetine  20 mg Oral QHS  . enoxaparin (LOVENOX) injection  40 mg Subcutaneous Q24H  . finasteride  5 mg Oral Daily  . fludrocortisone  0.1 mg Oral Daily  . fluticasone furoate-vilanterol  1 puff Inhalation Daily  . saccharomyces boulardii  250 mg Oral BID  . tamsulosin  0.4 mg Oral QPC supper  . vancomycin  125 mg Oral QID   Continuous Infusions: . sodium chloride 100 mL/hr at 02/26/20 0931   PRN Meds:.ondansetron **OR** ondansetron (ZOFRAN) IV, oxyCODONE-acetaminophen, polyvinyl alcohol, tiZANidine  Anti-infectives (From admission, onward)   Start     Dose/Rate Route Frequency Ordered Stop   02/25/20 2200  vancomycin (VANCOCIN) 50 mg/mL oral solution 125 mg        125 mg Oral 4 times daily 02/25/20 2058 03/06/20 2159        Objective:   Vitals:   02/26/20 0550 02/26/20 0845 02/26/20 0900 02/26/20 0929  BP: (!) 152/113   (!) 149/83  Pulse: 82     Resp: 18     Temp: (!) 97.5 F (36.4 C)     TempSrc:      SpO2: 98% 95% 95%   Weight:      Height:        Wt Readings from Last 3 Encounters:  02/25/20 77.3 kg  11/07/19 79.4  kg  08/18/19 81.6 kg     Intake/Output Summary (Last 24 hours) at 02/26/2020 1115 Last data filed at 02/26/2020 0600 Gross per 24 hour  Intake 856.97 ml  Output --  Net 856.97 ml   Physical Exam  Gen:- Awake Alert,  In no apparent distress  HEENT:- [place].AT, No sclera icterus Neck-Supple Neck,No JVD,.  Lungs-  CTAB , fair symmetrical air movement CV- S1, S2 normal, regular  Abd-  +ve B.Sounds, Abd Soft, lower abdominal tenderness without rebound or guarding    Extremity/Skin:- No  edema, pedal pulses present  Psych-affect is appropriate, oriented x3 Neuro-no new focal deficits, no tremors   Data Review:   Micro Results Recent Results (from the past 240 hour(s))  C Difficile Quick Screen w PCR reflex     Status: Abnormal   Collection Time: 02/25/20  3:33 PM   Specimen: Stool  Result Value Ref Range Status   C Diff antigen POSITIVE (A) NEGATIVE Final   C Diff toxin POSITIVE (A) NEGATIVE Final   C Diff interpretation Toxin producing C. difficile detected.  Final    Comment: CRITICAL RESULT CALLED TO, READ BACK BY AND VERIFIED WITH: TUTTLE,A AT 2054 02/25/2020 BY MOSLEY,J Performed at [facility], [address], [place], [place] [address]   Respiratory Panel by RT PCR (Flu A&B, Covid) -     Status: None   Collection Time: 02/25/20  7:49 PM   Specimen: Nasopharyngeal  Result Value Ref Range Status   SARS Coronavirus 2 by RT PCR NEGATIVE NEGATIVE Final    Comment: (NOTE) SARS-CoV-2 target nucleic acids are NOT DETECTED.  The SARS-CoV-2 RNA is generally detectable in upper respiratoy specimens during the acute phase of infection. The lowest concentration of SARS-CoV-2 viral copies this assay can detect is 131 copies/mL. A negative result does not preclude SARS-Cov-2 infection and should not be used as the sole basis for treatment or other patient management decisions. A negative result may occur with  improper specimen collection/handling, submission of specimen other than  nasopharyngeal swab, presence of viral mutation(s) within the areas targeted by this assay, and inadequate number of viral copies (<131 copies/mL). A negative result must be combined with clinical observations, patient history, and epidemiological information. The expected result is Negative.  Fact Sheet for Patients:  PinkCheek.be  Fact Sheet for Healthcare Providers:  GravelBags.it  This test is no t yet approved or cleared by the [place] FDA and  has been authorized for detection  and/or diagnosis of SARS-CoV-2 by FDA under an Emergency Use Authorization (EUA). This EUA will remain  in effect (meaning this test can be used) for the duration of the COVID-19 declaration under Section 564(b)(1) of the Act, 21 U.S.C. section 360bbb-3(b)(1), unless the authorization is terminated or revoked sooner.     Influenza A by PCR NEGATIVE NEGATIVE Final   Influenza B by PCR NEGATIVE NEGATIVE Final    Comment: (NOTE) The Xpert Xpress SARS-CoV-2/FLU/RSV assay is intended as an aid in  the diagnosis of influenza from Nasopharyngeal swab specimens and  should not be used as a sole basis for treatment. Nasal washings and  aspirates are unacceptable for Xpert Xpress SARS-CoV-2/FLU/RSV  testing.  Fact Sheet for Patients: PinkCheek.be  Fact Sheet for Healthcare Providers: GravelBags.it  This test is not yet approved or cleared by the [place] FDA and  has been authorized for detection and/or diagnosis of SARS-CoV-2 by  FDA under an Emergency Use Authorization (EUA). This EUA will remain  in effect (meaning this test can be used) for the duration of the  Covid-19 declaration under Section 564(b)(1) of the Act, 21  U.S.C. section 360bbb-3(b)(1), unless the authorization is  terminated or revoked. Performed at [facility], [address] [place], [place] [address]      Radiology Reports CT ABDOMEN PELVIS W CONTRAST  Result Date: 02/25/2020 CLINICAL DATA:  Pt c/o lower abdominal pain x 2 weeks Denies n/v/d EXAM: CT ABDOMEN AND PELVIS WITH CONTRAST TECHNIQUE: Multidetector CT imaging of the abdomen and pelvis was performed using the standard protocol following bolus administration of intravenous contrast. CONTRAST:  125m OMNIPAQUE IOHEXOL 300 MG/ML  SOLN COMPARISON:  CT abdomen pelvis 01/06/2017 FINDINGS: Lower chest: No acute abnormality. Stable small subpleural nodule in the left lung base. Hepatobiliary: No focal liver abnormality is seen. No gallstones, gallbladder wall thickening, or biliary dilatation. Pancreas: Unremarkable. No pancreatic ductal dilatation or surrounding inflammatory changes. Spleen: Normal in size without focal abnormality. Adrenals/Urinary Tract: Adrenal glands are unremarkable. Tiny hypodensity in the superior right kidney which is too small to fully characterize but likely represents a small cyst. No hydronephrosis or renal calculi bilaterally. No suspicious solid mass. Unremarkable urinary bladder. Stomach/Bowel: Stomach is within normal limits. There is diffuse bowel wall thickening most prominent in the ascending colon and cecum with surrounding pericolonic fat stranding. No evidence of obstruction. No evidence of free air. Vascular/Lymphatic: Aortic atherosclerosis. No enlarged abdominal or pelvic lymph nodes. Reproductive: Redemonstrated ovoid cyst measuring 1.7 cm the base of the penis, possibly a benign cyst or urethral diverticulum. Other: Small fat containing umbilical hernia as well as another small fat containing midline hernia just above the umbilicus. Musculoskeletal: No acute or significant osseous findings. IMPRESSION: 1. Diffuse bowel wall thickening most prominent in the ascending colon and cecum with surrounding pericolonic fat stranding, consistent with colitis, which may be infectious or inflammatory in etiology. 2. Small fat  containing umbilical hernia as well as another small fat containing midline hernia just above the umbilicus. 3. Redemonstrated ovoid cyst measuring 1.7 cm the base of the penis, possibly a benign cyst or urethral diverticulum. 4. Aortic atherosclerosis. Aortic Atherosclerosis (ICD10-I70.0). Electronically Signed   By: NAudie PintoM.D.   On: 02/25/2020 17:26     CBC Recent Labs  Lab 02/25/20 1553  WBC 31.7*  HGB 14.3  HCT 44.0  PLT 399  MCV 96.3  MCH 31.3  MCHC 32.5  RDW 12.7  LYMPHSABS 1.2  MONOABS 1.3*  EOSABS 0.1  BASOSABS  0.1    Chemistries  Recent Labs  Lab 02/25/20 1553  NA 138  K 4.3  CL 104  CO2 25  GLUCOSE 107*  BUN 13  CREATININE 0.98  CALCIUM 8.7*  AST 13*  ALT 17  ALKPHOS 59  BILITOT 0.4   ------------------------------------------------------------------------------------------------------------------ No results for input(s): CHOL, HDL, LDLCALC, TRIG, CHOLHDL, LDLDIRECT in the last 72 hours.  No results found for: HGBA1C ------------------------------------------------------------------------------------------------------------------ No results for input(s): TSH, T4TOTAL, T3FREE, THYROIDAB in the last 72 hours.  Invalid input(s): FREET3 ------------------------------------------------------------------------------------------------------------------ No results for input(s): VITAMINB12, FOLATE, FERRITIN, TIBC, IRON, RETICCTPCT in the last 72 hours.  Coagulation profile No results for input(s): INR, PROTIME in the last 168 hours.  No results for input(s): DDIMER in the last 72 hours.  Cardiac Enzymes No results for input(s): CKMB, TROPONINI, MYOGLOBIN in the last 168 hours.  Invalid input(s): CK ------------------------------------------------------------------------------------------------------------------    Component Value Date/Time   BNP 24.0 08/18/2019 0677     Roxan Hockey M.D on 02/26/2020 at 11:15 AM  Go to [email or web address] -  for contact info  [facility] - Office  [phone]

## 2020-02-27 ENCOUNTER — Other Ambulatory Visit (HOSPITAL_COMMUNITY): Payer: Self-pay | Admitting: Family Medicine

## 2020-02-27 LAB — GASTROINTESTINAL PANEL BY PCR, STOOL (REPLACES STOOL CULTURE)

## 2020-02-27 MED ORDER — AMLODIPINE BESYLATE 10 MG PO TABS: 10.0000 mg | ORAL_TABLET | Freq: Every day | ORAL | 3 refills | Status: AC

## 2020-02-27 MED ORDER — FUROSEMIDE 20 MG PO TABS
20.0000 mg | ORAL_TABLET | Freq: Every day | ORAL | 0 refills | Status: DC
Start: 2020-03-05 — End: 2024-03-02

## 2020-02-27 MED ORDER — VANCOMYCIN HCL 125 MG PO CAPS: 125.0000 mg | ORAL_CAPSULE | Freq: Four times a day (QID) | ORAL | 0 refills | Status: DC

## 2020-02-27 MED ORDER — ASPIRIN EC 81 MG PO TBEC: 162.0000 mg | DELAYED_RELEASE_TABLET | Freq: Every day | ORAL | 11 refills | Status: AC

## 2020-02-27 MED FILL — VANCOMYCIN HCL 125 MG CAP: 125 | 14 days supply | Qty: 56 | Fill #0

## 2020-02-27 NOTE — Progress Notes (Signed)
Patient Demographics:    [name], is a 69 y.o. male, DOB - 05/17/1950, POE:[id number]  Admit date - 02/25/2020   Admitting Physician Truett Mainland, DO  Outpatient Primary MD for the patient is Redmond School, MD  LOS - 2   Chief Complaint  Patient presents with  . Diarrhea        Subjective:    [name] today has no fevers, no emesis,  No chest pain,   - Frequency and consistency of stools improving -Tolerating liquid diet  Assessment  & Plan :    Principal Problem:   Sepsis secondary to C. difficile colitis Active Problems:   C. difficile colitis   COPD (chronic obstructive pulmonary disease) (HCC)   Anxiety   GERD (gastroesophageal reflux disease)   Chronic pain syndrome   Benign prostatic hyperplasia with urinary obstruction  Brief Summary:-   69 y.o. male with a history of COPD, hypertension, chronic pain with opioid dependence, coronary artery disease, anxiety noted on 02/25/2020 with sepsis secondary to C. difficile colitis  A/p 1) sepsis secondary to C. difficile colitis--POA--patient met sepsis criteria on admission with leukocytosis, tachycardia, tachypnea and finding of C. difficile colitis -WBC  31.7 >>22.0 --Oral vancomycin and IV fluids -Continue as needed opiates as well as antiemetics -  2)COPD-no acute exacerbation at this time, continue Breo Ellipta, may use albuterol as needed  3)BPH-stable, continue Proscar and Flomax  4) chronic pain syndrome--continue Zanaflex , Cymbalta and as needed oxycodone  5)HTN-stable, continue amlodipine 10 mg daily, patient is on Florinef 0.1 mg daily presumably for orthostatic concerns -Lasix is on hold due to diarrhea and risk of dehydration  Disposition/Need for in-Hospital Stay- patient unable to be discharged at this time due to --sepsis secondary to C. difficile colitis requiring fluids and antibiotics  Status is:  Inpatient  Remains inpatient appropriate because:sepsis secondary to C. difficile colitis requiring fluids and antibiotics  Disposition: The patient is from: Home              Anticipated d/c is to: Home              Anticipated d/c date is: 1 day              Patient currently is not medically stable to d/c. Barriers: Not Clinically Stable- sepsis secondary to C. difficile colitis requiring fluids and antibiotics  Code Status : full  Family Communication:   (patient is alert, awake and coherent)   Consults  :  na  DVT Prophylaxis  :  Lovenox -  - SCDs   Lab Results  Component Value Date   PLT 387 02/26/2020    Inpatient Medications  Scheduled Meds: . albuterol  2.5 mg Nebulization TID  . amLODipine  10 mg Oral Daily  . aspirin EC  162 mg Oral Daily  . DULoxetine  20 mg Oral QHS  . enoxaparin (LOVENOX) injection  40 mg Subcutaneous Q24H  . finasteride  5 mg Oral Daily  . fludrocortisone  0.1 mg Oral Daily  . fluticasone furoate-vilanterol  1 puff Inhalation Daily  . saccharomyces boulardii  250 mg Oral BID  . tamsulosin  0.4 mg Oral QPC supper  . vancomycin  125 mg Oral QID   Continuous  Infusions: . sodium chloride 75 mL/hr at 02/27/20 1038   PRN Meds:.ondansetron **OR** ondansetron (ZOFRAN) IV, oxyCODONE-acetaminophen, polyvinyl alcohol, tiZANidine  Anti-infectives (From admission, onward)   Start     Dose/Rate Route Frequency Ordered Stop   02/27/20 0000  vancomycin (VANCOCIN) 125 MG capsule        125 mg Oral 4 times daily 02/27/20 1522 03/12/20 2359   02/25/20 2200  vancomycin (VANCOCIN) 50 mg/mL oral solution 125 mg        125 mg Oral 4 times daily 02/25/20 2058 03/06/20 2159        Objective:   Vitals:   02/27/20 0822 02/27/20 0827 02/27/20 1442 02/27/20 1512  BP:    132/88  Pulse:    87  Resp:    18  Temp:    (!) 97.5 F (36.4 C)  TempSrc:    Oral  SpO2: 95% 95% 95% 98%  Weight:      Height:        Wt Readings from Last 3 Encounters:   02/25/20 77.3 kg  11/07/19 79.4 kg  08/18/19 81.6 kg    No intake or output data in the 24 hours ending 02/27/20 1855 Physical Exam  Gen:- Awake Alert,  In no apparent distress  HEENT:- [place].AT, No sclera icterus Neck-Supple Neck,No JVD,.  Lungs-  CTAB , fair symmetrical air movement CV- S1, S2 normal, regular  Abd-  +ve B.Sounds, Abd Soft, lower abdominal tenderness without rebound or guarding    Extremity/Skin:- No  edema, pedal pulses present  Psych-affect is appropriate, oriented x3 Neuro-no new focal deficits, no tremors   Data Review:   Micro Results Recent Results (from the past 240 hour(s))  Gastrointestinal Panel by PCR , Stool     Status: None   Collection Time: 02/25/20  3:32 PM   Specimen: Stool  Result Value Ref Range Status   Campylobacter species NOT DETECTED NOT DETECTED Final   Plesimonas shigelloides NOT DETECTED NOT DETECTED Final   Salmonella species NOT DETECTED NOT DETECTED Final   Yersinia enterocolitica NOT DETECTED NOT DETECTED Final   Vibrio species NOT DETECTED NOT DETECTED Final   Vibrio cholerae NOT DETECTED NOT DETECTED Final   Enteroaggregative E coli (EAEC) NOT DETECTED NOT DETECTED Final   Enteropathogenic E coli (EPEC) NOT DETECTED NOT DETECTED Final   Enterotoxigenic E coli (ETEC) NOT DETECTED NOT DETECTED Final   Shiga like toxin producing E coli (STEC) NOT DETECTED NOT DETECTED Final   Shigella/Enteroinvasive E coli (EIEC) NOT DETECTED NOT DETECTED Final   Cryptosporidium NOT DETECTED NOT DETECTED Final   Cyclospora cayetanensis NOT DETECTED NOT DETECTED Final   Entamoeba histolytica NOT DETECTED NOT DETECTED Final   Giardia lamblia NOT DETECTED NOT DETECTED Final   Adenovirus F40/41 NOT DETECTED NOT DETECTED Final   Astrovirus NOT DETECTED NOT DETECTED Final   Norovirus GI/GII NOT DETECTED NOT DETECTED Final   Rotavirus A NOT DETECTED NOT DETECTED Final   Sapovirus (I, II, IV, and V) NOT DETECTED NOT DETECTED Final    Comment:  Performed at [facility], [address] [place], [place] [address]  C Difficile Quick Screen w PCR reflex     Status: Abnormal   Collection Time: 02/25/20  3:33 PM   Specimen: Stool  Result Value Ref Range Status   C Diff antigen POSITIVE (A) NEGATIVE Final   C Diff toxin POSITIVE (A) NEGATIVE Final   C Diff interpretation Toxin producing C. difficile detected.  Final    Comment: CRITICAL  RESULT CALLED TO, READ BACK BY AND VERIFIED WITH: TUTTLE,A AT 2054 02/25/2020 BY MOSLEY,J Performed at [facility], [address], [place], [place] [address]   Respiratory Panel by RT PCR (Flu A&B, Covid) -     Status: None   Collection Time: 02/25/20  7:49 PM   Specimen: Nasopharyngeal  Result Value Ref Range Status   SARS Coronavirus 2 by RT PCR NEGATIVE NEGATIVE Final    Comment: (NOTE) SARS-CoV-2 target nucleic acids are NOT DETECTED.  The SARS-CoV-2 RNA is generally detectable in upper respiratoy specimens during the acute phase of infection. The lowest concentration of SARS-CoV-2 viral copies this assay can detect is 131 copies/mL. A negative result does not preclude SARS-Cov-2 infection and should not be used as the sole basis for treatment or other patient management decisions. A negative result may occur with  improper specimen collection/handling, submission of specimen other than nasopharyngeal swab, presence of viral mutation(s) within the areas targeted by this assay, and inadequate number of viral copies (<131 copies/mL). A negative result must be combined with clinical observations, patient history, and epidemiological information. The expected result is Negative.  Fact Sheet for Patients:  PinkCheek.be  Fact Sheet for Healthcare Providers:  GravelBags.it  This test is no t yet approved or cleared by the [place] FDA and  has been authorized for detection and/or diagnosis of SARS-CoV-2 by FDA under an  Emergency Use Authorization (EUA). This EUA will remain  in effect (meaning this test can be used) for the duration of the COVID-19 declaration under Section 564(b)(1) of the Act, 21 U.S.C. section 360bbb-3(b)(1), unless the authorization is terminated or revoked sooner.     Influenza A by PCR NEGATIVE NEGATIVE Final   Influenza B by PCR NEGATIVE NEGATIVE Final    Comment: (NOTE) The Xpert Xpress SARS-CoV-2/FLU/RSV assay is intended as an aid in  the diagnosis of influenza from Nasopharyngeal swab specimens and  should not be used as a sole basis for treatment. Nasal washings and  aspirates are unacceptable for Xpert Xpress SARS-CoV-2/FLU/RSV  testing.  Fact Sheet for Patients: PinkCheek.be  Fact Sheet for Healthcare Providers: GravelBags.it  This test is not yet approved or cleared by the [place] FDA and  has been authorized for detection and/or diagnosis of SARS-CoV-2 by  FDA under an Emergency Use Authorization (EUA). This EUA will remain  in effect (meaning this test can be used) for the duration of the  Covid-19 declaration under Section 564(b)(1) of the Act, 21  U.S.C. section 360bbb-3(b)(1), unless the authorization is  terminated or revoked. Performed at [facility], [address], [place], [place] [address]     Radiology Reports CT ABDOMEN PELVIS W CONTRAST  Result Date: 02/25/2020 CLINICAL DATA:  Pt c/o lower abdominal pain x 2 weeks Denies n/v/d EXAM: CT ABDOMEN AND PELVIS WITH CONTRAST TECHNIQUE: Multidetector CT imaging of the abdomen and pelvis was performed using the standard protocol following bolus administration of intravenous contrast. CONTRAST:  145m OMNIPAQUE IOHEXOL 300 MG/ML  SOLN COMPARISON:  CT abdomen pelvis 01/06/2017 FINDINGS: Lower chest: No acute abnormality. Stable small subpleural nodule in the left lung base. Hepatobiliary: No focal liver abnormality is seen. No gallstones,  gallbladder wall thickening, or biliary dilatation. Pancreas: Unremarkable. No pancreatic ductal dilatation or surrounding inflammatory changes. Spleen: Normal in size without focal abnormality. Adrenals/Urinary Tract: Adrenal glands are unremarkable. Tiny hypodensity in the superior right kidney which is too small to fully characterize but likely represents a small cyst. No hydronephrosis or renal calculi  bilaterally. No suspicious solid mass. Unremarkable urinary bladder. Stomach/Bowel: Stomach is within normal limits. There is diffuse bowel wall thickening most prominent in the ascending colon and cecum with surrounding pericolonic fat stranding. No evidence of obstruction. No evidence of free air. Vascular/Lymphatic: Aortic atherosclerosis. No enlarged abdominal or pelvic lymph nodes. Reproductive: Redemonstrated ovoid cyst measuring 1.7 cm the base of the penis, possibly a benign cyst or urethral diverticulum. Other: Small fat containing umbilical hernia as well as another small fat containing midline hernia just above the umbilicus. Musculoskeletal: No acute or significant osseous findings. IMPRESSION: 1. Diffuse bowel wall thickening most prominent in the ascending colon and cecum with surrounding pericolonic fat stranding, consistent with colitis, which may be infectious or inflammatory in etiology. 2. Small fat containing umbilical hernia as well as another small fat containing midline hernia just above the umbilicus. 3. Redemonstrated ovoid cyst measuring 1.7 cm the base of the penis, possibly a benign cyst or urethral diverticulum. 4. Aortic atherosclerosis. Aortic Atherosclerosis (ICD10-I70.0). Electronically Signed   By: Audie Pinto M.D.   On: 02/25/2020 17:26     CBC Recent Labs  Lab 02/25/20 1553 02/26/20 1108  WBC 31.7* 22.0*  HGB 14.3 14.1  HCT 44.0 42.7  PLT 399 387  MCV 96.3 96.4  MCH 31.3 31.8  MCHC 32.5 33.0  RDW 12.7 12.7  LYMPHSABS 1.2  --   MONOABS 1.3*  --   EOSABS  0.1  --   BASOSABS 0.1  --     Chemistries  Recent Labs  Lab 02/25/20 1553 02/26/20 1108  NA 138 136  K 4.3 3.9  CL 104 103  CO2 25 24  GLUCOSE 107* 98  BUN 13 8  CREATININE 0.98 0.69  CALCIUM 8.7* 8.8*  AST 13*  --   ALT 17  --   ALKPHOS 59  --   BILITOT 0.4  --    ------------------------------------------------------------------------------------------------------------------ No results for input(s): CHOL, HDL, LDLCALC, TRIG, CHOLHDL, LDLDIRECT in the last 72 hours.  No results found for: HGBA1C ------------------------------------------------------------------------------------------------------------------ No results for input(s): TSH, T4TOTAL, T3FREE, THYROIDAB in the last 72 hours.  Invalid input(s): FREET3 ------------------------------------------------------------------------------------------------------------------ No results for input(s): VITAMINB12, FOLATE, FERRITIN, TIBC, IRON, RETICCTPCT in the last 72 hours.  Coagulation profile No results for input(s): INR, PROTIME in the last 168 hours.  No results for input(s): DDIMER in the last 72 hours.  Cardiac Enzymes No results for input(s): CKMB, TROPONINI, MYOGLOBIN in the last 168 hours.  Invalid input(s): CK ------------------------------------------------------------------------------------------------------------------    Component Value Date/Time   BNP 24.0 08/18/2019 0300     Roxan Hockey M.D on 02/27/2020 at 6:55 PM  Go to [email or web address] - for contact info  [facility] - Office  [phone]

## 2020-02-28 LAB — CBC
HCT: 41.2 % (ref 39.0–52.0)
Hemoglobin: 13.5 g/dL (ref 13.0–17.0)
MCH: 31 pg (ref 26.0–34.0)
MCHC: 32.8 g/dL (ref 30.0–36.0)
MCV: 94.5 fL (ref 80.0–100.0)
Platelets: 390 10*3/uL (ref 150–400)
RBC: 4.36 MIL/uL (ref 4.22–5.81)
RDW: 12.3 % (ref 11.5–15.5)
WBC: 11.3 10*3/uL — ABNORMAL HIGH (ref 4.0–10.5)
nRBC: 0 % (ref 0.0–0.2)

## 2020-02-28 LAB — BASIC METABOLIC PANEL
Anion gap: 9 (ref 5–15)
BUN: 6 mg/dL — ABNORMAL LOW (ref 8–23)
CO2: 25 mmol/L (ref 22–32)
Calcium: 8.5 mg/dL — ABNORMAL LOW (ref 8.9–10.3)
Chloride: 105 mmol/L (ref 98–111)
Creatinine, Ser: 0.69 mg/dL (ref 0.61–1.24)
GFR, Estimated: >60 mL/min (ref >60)
Glucose, Bld: 95 mg/dL (ref 70–99)
Potassium: 3.4 mmol/L — ABNORMAL LOW (ref 3.5–5.1)
Sodium: 139 mmol/L (ref 135–145)

## 2020-02-28 NOTE — Care Management Important Message (Signed)
Important Message  Patient Details  Name: [name] MRN: [id number] Date of Birth: 11-10-1950   Medicare Important Message Given:  Yes Alesia Banda, RN will deliver due to contact precautions)     Corey Harold 02/28/2020, 12:05 PM

## 2020-02-28 NOTE — Progress Notes (Signed)
Pt states that he has had 4 moderately sized loose brown stools since midnight. Denies any n/v or abd pain, ate 100% of his breakfast meal. IVF infusing without difficulty or s/s infiltration. Plan is for discharge to home today, pt aware and agreeable.

## 2020-02-28 NOTE — Discharge Summary (Signed)
[name], is a 69 y.o. male  DOB [birth year]  MRN [id number].  Admission date:  02/25/2020  Admitting Physician  Truett Mainland, DO  Discharge Date:  02/28/2020   Primary MD  Redmond School, MD  Recommendations for primary care physician for things to follow:   1)Take Vancomycin capsules 125 mg 4 times a day as prescribed  2)Avoid Imodium---  3)Drink Plenty Fluids--  Admission Diagnosis  Colitis [K52.9] C. difficile colitis [A04.72]   Discharge Diagnosis  Colitis [K52.9] C. difficile colitis [A04.72]    Principal Problem:   Sepsis secondary to C. difficile colitis Active Problems:   C. difficile colitis   COPD (chronic obstructive pulmonary disease) ([place])   Anxiety   GERD (gastroesophageal reflux disease)   Chronic pain syndrome   Benign prostatic hyperplasia with urinary obstruction      Past Medical History:  Diagnosis Date  . Anxiety   . CAD (coronary artery disease)   . Chronic pain   . 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 Dyslipidemia   . History of tobacco abuse    80 pack year history   . Hypertension   . On home O2    4L N/C  . Opioid dependence ([place])   . Respiratory failure [facility])     Past Surgical History:  Procedure Laterality Date  . CARDIAC CATHETERIZATION  08/10/2008   right & left - normal coronaries (Dr. Norlene Duel)  . NM MYOCAR PERF WALL MOTION  07/2008   bruce myoview - mild ischemia in basal inferoseptal & mid inferoseptal regions; EF 63%  . TRANSTHORACIC ECHOCARDIOGRAM  07/2008   EF=>55%; RV mildly dilated; RA mild-mod dilated; mild mitral annular calcification & mild MR; AV mildly sclerotic     HPI  from the history and physical done on the day of admission:    Patient Coming From: home  Chief Complaint: diarrhea, abdominal pain  HPI: [name] is a 69 y.o. male with a history of COPD, hypertension, chronic  pain with opioid dependence, coronary artery disease, anxiety.  Patient presents with abdominal pain and diarrhea for the past 2 weeks.  This started after getting antibiotics for a sinus infection.  He supposedly has seen his primary care physician who gave him some medicine for the diarrhea, but that did not help.  Pain does not radiate.  His symptoms are worsening.  He does have diminished appetite.  Denies nausea and vomiting, fevers chills, shortness of breath and chest pain.  Emergency Department Course: Had diarrhea in ED.  White count 30.  CT shows right-sided colitis       Hospital Course:   Brief Summary:-  69 y.o.malewith a history of COPD, hypertension, chronic pain with opioid dependence, coronary artery disease, anxiety noted on 02/25/2020 with sepsis secondary to C. difficile colitis  A/p 1)Sepsis secondary to C. difficile colitis--POA--patient met sepsis criteria on admission with leukocytosis, tachycardia, tachypnea and finding of C. difficile colitis -WBC  31.7 >>22.0>>11.3 --Patient was treated with  oral vancomycin and IV fluids -Also treated with opiates as well as antiemetics -Overall sepsis pathophysiology resolved -Frequency and consistency of stools have improved significantly patient tolerating oral intake well, no emesis -Patient improved significantly -Okay to discharge home on oral vancomycin -Maintain adequate hydration, avoid Imodium  2)COPD-no acute exacerbation at this time, continue Breo Ellipta,   -Continue bronchodilators  3)BPH-stable, continue Proscar and Flomax  4) chronic pain syndrome--continue Zanaflex , Cymbalta and as needed oxycodone  5)HTN-stable, continue amlodipine 10 mg daily, patient is on Florinef 0.1 mg daily presumably for orthostatic concerns -Continue to hold Lasix in the setting of diarrhea and risk of dehydration -May restart Lasix around 03/05/2020 once diarrhea resolves completely  Disposition--discharge  home  Disposition: The patient is from: Home  Anticipated d/c is to: Home Code Status : full  Family Communication:   (patient is alert, awake and coherent)   Consults  :  na   Discharge Condition: Stable  Follow UP--PCP  Diet and Activity recommendation:  As advised  Discharge Instructions    Discharge Instructions    Call MD for:  difficulty breathing, headache or visual disturbances   Complete by: As directed    Call MD for:  difficulty breathing, headache or visual disturbances   Complete by: As directed    Call MD for:  persistant dizziness or light-headedness   Complete by: As directed    Call MD for:  persistant nausea and vomiting   Complete by: As directed    Call MD for:  persistant nausea and vomiting   Complete by: As directed    Call MD for:  severe uncontrolled pain   Complete by: As directed    Call MD for:  severe uncontrolled pain   Complete by: As directed    Call MD for:  temperature >100.4   Complete by: As directed    Call MD for:  temperature >100.4   Complete by: As directed    Diet - low sodium heart healthy   Complete by: As directed    Diet - low sodium heart healthy   Complete by: As directed    Discharge instructions   Complete by: As directed    Drink plenty fluids, avoid dehydration, follow-up with PCP as advised   Discharge instructions   Complete by: As directed    1)Take Vancomycin capsules 125 mg 4 times a day as prescribed  2)Avoid Imodium---  3)Drink Plenty Fluids--   Increase activity slowly   Complete by: As directed    Increase activity slowly   Complete by: As directed       Discharge Medications     Allergies as of 02/28/2020      Reactions   Ativan [lorazepam] Other (See Comments)   Dry mouth sinus and breathing problems      Medication List    STOP taking these medications   amoxicillin-clavulanate 875-125 MG tablet Commonly known as: AUGMENTIN   ciprofloxacin 500 MG  tablet Commonly known as: CIPRO   lactulose 10 GM/15ML solution Commonly known as: CHRONULAC   loperamide 2 MG capsule Commonly known as: IMODIUM   metroNIDAZOLE 500 MG tablet Commonly known as: FLAGYL   predniSONE 20 MG tablet Commonly known as: Deltasone     TAKE these medications   albuterol 108 (90 Base) MCG/ACT inhaler Commonly known as: VENTOLIN HFA Inhale 2 puffs into the lungs every 6 (six) hours as needed for wheezing or shortness of breath.   albuterol (2.5 MG/3ML) 0.083% nebulizer solution Commonly  known as: PROVENTIL Take 3 mLs (2.5 mg total) by nebulization every 6 (six) hours as needed for wheezing or shortness of breath.   amLODipine 10 MG tablet Commonly known as: NORVASC Take 1 tablet (10 mg total) by mouth daily. What changed: medication strength   aspirin EC 81 MG tablet Take 2 tablets (162 mg total) by mouth daily with breakfast. What changed: when to take this   CENTRUM ADULTS PO Take 1 capsule by mouth daily.   DULoxetine 20 MG capsule Commonly known as: CYMBALTA Take 20 mg by mouth at bedtime.   finasteride 5 MG tablet Commonly known as: PROSCAR Take 1 tablet (5 mg total) by mouth daily.   fludrocortisone 0.1 MG tablet Commonly known as: FLORINEF Take 0.1 mg by mouth daily.   furosemide 20 MG tablet Commonly known as: LASIX Take 1 tablet (20 mg total) by mouth daily. Hold for 1 week Start taking on: March 05, 2020 What changed:   additional instructions  These instructions start on March 05, 2020. If you are unsure what to do until then, ask your doctor or other care provider.   guaiFENesin 600 MG 12 hr tablet Commonly known as: MUCINEX Take 600 mg by mouth 2 (two) times daily as needed.   multivitamin with minerals Tabs tablet Take 1 tablet by mouth daily.   omeprazole 40 MG capsule Commonly known as: PRILOSEC Take 40 mg by mouth daily.   ondansetron 8 MG tablet Commonly known as: ZOFRAN Take 1 tablet by mouth  daily.   oxyCODONE-acetaminophen 7.5-325 MG tablet Commonly known as: PERCOCET Take 1 tablet by mouth every 4 (four) hours as needed for pain.   potassium chloride SA 20 MEQ tablet Commonly known as: KLOR-CON Take 20 mEq by mouth daily.   simvastatin 20 MG tablet Commonly known as: ZOCOR TAKE 1 TABLET BY MOUTH DAILY AT 6 PM. What changed: See the new instructions.   Symbicort 160-4.5 MCG/ACT inhaler Generic drug: budesonide-formoterol Inhale 2 puffs into the lungs 2 (two) times daily.   Systane Balance 0.6 % Soln Generic drug: Propylene Glycol Place 1 drop into both eyes 2 (two) times daily as needed (dry eyes).   tadalafil 20 MG tablet Commonly known as: CIALIS Take 1 tablet (20 mg total) by mouth daily as needed for erectile dysfunction.   tamsulosin 0.4 MG Caps capsule Commonly known as: FLOMAX Take 1 capsule (0.4 mg total) by mouth in the morning and at bedtime.   tiZANidine 4 MG tablet Commonly known as: ZANAFLEX Take 1 tablet by mouth daily as needed for muscle spasms.   vancomycin 125 MG capsule Commonly known as: VANCOCIN Take 1 capsule (125 mg total) by mouth 4 (four) times daily for 14 days.       Major procedures and Radiology Reports - PLEASE review detailed and final reports for all details, in brief -  CT ABDOMEN PELVIS W CONTRAST  Result Date: 02/25/2020 CLINICAL DATA:  Pt c/o lower abdominal pain x 2 weeks Denies n/v/d EXAM: CT ABDOMEN AND PELVIS WITH CONTRAST TECHNIQUE: Multidetector CT imaging of the abdomen and pelvis was performed using the standard protocol following bolus administration of intravenous contrast. CONTRAST:  140m OMNIPAQUE IOHEXOL 300 MG/ML  SOLN COMPARISON:  CT abdomen pelvis 01/06/2017 FINDINGS: Lower chest: No acute abnormality. Stable small subpleural nodule in the left lung base. Hepatobiliary: No focal liver abnormality is seen. No gallstones, gallbladder wall thickening, or biliary dilatation. Pancreas: Unremarkable. No  pancreatic ductal dilatation or surrounding inflammatory changes. Spleen: Normal in  size without focal abnormality. Adrenals/Urinary Tract: Adrenal glands are unremarkable. Tiny hypodensity in the superior right kidney which is too small to fully characterize but likely represents a small cyst. No hydronephrosis or renal calculi bilaterally. No suspicious solid mass. Unremarkable urinary bladder. Stomach/Bowel: Stomach is within normal limits. There is diffuse bowel wall thickening most prominent in the ascending colon and cecum with surrounding pericolonic fat stranding. No evidence of obstruction. No evidence of free air. Vascular/Lymphatic: Aortic atherosclerosis. No enlarged abdominal or pelvic lymph nodes. Reproductive: Redemonstrated ovoid cyst measuring 1.7 cm the base of the penis, possibly a benign cyst or urethral diverticulum. Other: Small fat containing umbilical hernia as well as another small fat containing midline hernia just above the umbilicus. Musculoskeletal: No acute or significant osseous findings. IMPRESSION: 1. Diffuse bowel wall thickening most prominent in the ascending colon and cecum with surrounding pericolonic fat stranding, consistent with colitis, which may be infectious or inflammatory in etiology. 2. Small fat containing umbilical hernia as well as another small fat containing midline hernia just above the umbilicus. 3. Redemonstrated ovoid cyst measuring 1.7 cm the base of the penis, possibly a benign cyst or urethral diverticulum. 4. Aortic atherosclerosis. Aortic Atherosclerosis (ICD10-I70.0). Electronically Signed   By: Audie Pinto M.D.   On: 02/25/2020 17:26    Micro Results   Recent Results (from the past 240 hour(s))  Gastrointestinal Panel by PCR , Stool     Status: None   Collection Time: 02/25/20  3:32 PM   Specimen: Stool  Result Value Ref Range Status   Campylobacter species NOT DETECTED NOT DETECTED Final   Plesimonas shigelloides NOT DETECTED NOT  DETECTED Final   Salmonella species NOT DETECTED NOT DETECTED Final   Yersinia enterocolitica NOT DETECTED NOT DETECTED Final   Vibrio species NOT DETECTED NOT DETECTED Final   Vibrio cholerae NOT DETECTED NOT DETECTED Final   Enteroaggregative E coli (EAEC) NOT DETECTED NOT DETECTED Final   Enteropathogenic E coli (EPEC) NOT DETECTED NOT DETECTED Final   Enterotoxigenic E coli (ETEC) NOT DETECTED NOT DETECTED Final   Shiga like toxin producing E coli (STEC) NOT DETECTED NOT DETECTED Final   Shigella/Enteroinvasive E coli (EIEC) NOT DETECTED NOT DETECTED Final   Cryptosporidium NOT DETECTED NOT DETECTED Final   Cyclospora cayetanensis NOT DETECTED NOT DETECTED Final   Entamoeba histolytica NOT DETECTED NOT DETECTED Final   Giardia lamblia NOT DETECTED NOT DETECTED Final   Adenovirus F40/41 NOT DETECTED NOT DETECTED Final   Astrovirus NOT DETECTED NOT DETECTED Final   Norovirus GI/GII NOT DETECTED NOT DETECTED Final   Rotavirus A NOT DETECTED NOT DETECTED Final   Sapovirus (I, II, IV, and V) NOT DETECTED NOT DETECTED Final    Comment: Performed at [facility], [address] [place], [place] [address]  C Difficile Quick Screen w PCR reflex     Status: Abnormal   Collection Time: 02/25/20  3:33 PM   Specimen: Stool  Result Value Ref Range Status   C Diff antigen POSITIVE (A) NEGATIVE Final   C Diff toxin POSITIVE (A) NEGATIVE Final   C Diff interpretation Toxin producing C. difficile detected.  Final    Comment: CRITICAL RESULT CALLED TO, READ BACK BY AND VERIFIED WITH: TUTTLE,A AT 2054 02/25/2020 BY MOSLEY,J Performed at [facility], [address] [place], [place] [address]   Respiratory Panel by RT PCR (Flu A&B, Covid) -     Status: None   Collection Time: 02/25/20  7:49 PM   Specimen: Nasopharyngeal  Result Value Ref Range Status   SARS Coronavirus 2 by RT PCR NEGATIVE NEGATIVE Final    Comment: (NOTE) SARS-CoV-2 target nucleic acids are NOT DETECTED.  The  SARS-CoV-2 RNA is generally detectable in upper respiratoy specimens during the acute phase of infection. The lowest concentration of SARS-CoV-2 viral copies this assay can detect is 131 copies/mL. A negative result does not preclude SARS-Cov-2 infection and should not be used as the sole basis for treatment or other patient management decisions. A negative result may occur with  improper specimen collection/handling, submission of specimen other than nasopharyngeal swab, presence of viral mutation(s) within the areas targeted by this assay, and inadequate number of viral copies (<131 copies/mL). A negative result must be combined with clinical observations, patient history, and epidemiological information. The expected result is Negative.  Fact Sheet for Patients:  PinkCheek.be  Fact Sheet for Healthcare Providers:  GravelBags.it  This test is no t yet approved or cleared by the [place] FDA and  has been authorized for detection and/or diagnosis of SARS-CoV-2 by FDA under an Emergency Use Authorization (EUA). This EUA will remain  in effect (meaning this test can be used) for the duration of the COVID-19 declaration under Section 564(b)(1) of the Act, 21 U.S.C. section 360bbb-3(b)(1), unless the authorization is terminated or revoked sooner.     Influenza A by PCR NEGATIVE NEGATIVE Final   Influenza B by PCR NEGATIVE NEGATIVE Final    Comment: (NOTE) The Xpert Xpress SARS-CoV-2/FLU/RSV assay is intended as an aid in  the diagnosis of influenza from Nasopharyngeal swab specimens and  should not be used as a sole basis for treatment. Nasal washings and  aspirates are unacceptable for Xpert Xpress SARS-CoV-2/FLU/RSV  testing.  Fact Sheet for Patients: PinkCheek.be  Fact Sheet for Healthcare Providers: GravelBags.it  This test is not yet approved or cleared  by the [place] FDA and  has been authorized for detection and/or diagnosis of SARS-CoV-2 by  FDA under an Emergency Use Authorization (EUA). This EUA will remain  in effect (meaning this test can be used) for the duration of the  Covid-19 declaration under Section 564(b)(1) of the Act, 21  U.S.C. section 360bbb-3(b)(1), unless the authorization is  terminated or revoked. Performed at [facility], [address] [place], [place] [address]    Today   Subjective    [name] today has no new complaints No fever  Or chills   No Nausea, Vomiting or Diarrhea       Patient has been seen and examined prior to discharge   Objective   Blood pressure (!) 146/88, pulse 68, temperature 98 F (36.7 C), temperature source Oral, resp. rate 18, height _0  (1.753 m), weight 77.3 kg, SpO2 100 %.   Intake/Output Summary (Last 24 hours) at 02/28/2020 1115 Last data filed at 02/27/2020 1900 Gross per 24 hour  Intake 480 ml  Output --  Net 480 ml   Exam Gen:- Awake Alert, no acute distress  HEENT:- [place].AT, No sclera icterus Neck-Supple Neck,No JVD,.  Lungs-  CTAB , good air movement bilaterally  CV- S1, S2 normal, regular Abd-  +ve B.Sounds, Abd Soft, No tenderness,    Extremity/Skin:- No  edema,   good pulses Psych-affect is appropriate, oriented x3 Neuro-no new focal deficits, no tremors    Data Review   CBC w Diff:  Lab Results  Component Value Date   WBC 11.3 (H) 02/28/2020   HGB 13.5 02/28/2020   HCT 41.2 02/28/2020  PLT 390 02/28/2020   LYMPHOPCT 4 02/25/2020   MONOPCT 4 02/25/2020   EOSPCT 0 02/25/2020   BASOPCT 0 02/25/2020    CMP:  Lab Results  Component Value Date   NA 139 02/28/2020   K 3.4 (L) 02/28/2020   CL 105 02/28/2020   CO2 25 02/28/2020   BUN 6 (L) 02/28/2020   CREATININE 0.69 02/28/2020   PROT 6.4 (L) 02/25/2020   ALBUMIN 3.7 02/25/2020   BILITOT 0.4 02/25/2020   ALKPHOS 59 02/25/2020   AST 13 (L) 02/25/2020   ALT 17 02/25/2020   .   Total Discharge time is about 33 minutes  Roxan Hockey M.D on 02/28/2020 at 11:15 AM  Go to [email or web address] -  for contact info  [facility] - Office  [phone]

## 2020-02-28 NOTE — Plan of Care (Signed)
  Problem: Education: Goal: Knowledge of General Education information will improve Description Including pain rating scale, medication(s)/side effects and non-pharmacologic comfort measures Outcome: Progressing   

## 2020-02-28 NOTE — Progress Notes (Signed)
Pt discharged via WC to POV with all belongings and medications in his possession.

## 2020-02-28 NOTE — Discharge Instructions (Signed)
1)Take Vancomycin capsules 125 mg 4 times a day as prescribed  2)Avoid Imodium---  3)Drink Plenty Fluids--

## 2020-03-01 DIAGNOSIS — J439 Emphysema, unspecified: Secondary | ICD-10-CM | POA: Diagnosis not present

## 2020-03-01 DIAGNOSIS — J449 Chronic obstructive pulmonary disease, unspecified: Secondary | ICD-10-CM | POA: Diagnosis not present

## 2020-03-01 DIAGNOSIS — J42 Unspecified chronic bronchitis: Secondary | ICD-10-CM | POA: Diagnosis not present

## 2020-03-05 DIAGNOSIS — J449 Chronic obstructive pulmonary disease, unspecified: Secondary | ICD-10-CM | POA: Diagnosis not present

## 2020-03-05 DIAGNOSIS — Z6823 Body mass index (BMI) 23.0-23.9, adult: Secondary | ICD-10-CM | POA: Diagnosis not present

## 2020-03-05 DIAGNOSIS — G894 Chronic pain syndrome: Secondary | ICD-10-CM | POA: Diagnosis not present

## 2020-03-08 DIAGNOSIS — J441 Chronic obstructive pulmonary disease with (acute) exacerbation: Secondary | ICD-10-CM | POA: Diagnosis not present

## 2020-03-20 DIAGNOSIS — I2510 Atherosclerotic heart disease of native coronary artery without angina pectoris: Secondary | ICD-10-CM | POA: Diagnosis not present

## 2020-03-20 DIAGNOSIS — M1991 Primary osteoarthritis, unspecified site: Secondary | ICD-10-CM | POA: Diagnosis not present

## 2020-03-20 DIAGNOSIS — J449 Chronic obstructive pulmonary disease, unspecified: Secondary | ICD-10-CM | POA: Diagnosis not present

## 2020-03-20 DIAGNOSIS — Z720 Tobacco use: Secondary | ICD-10-CM | POA: Diagnosis not present

## 2020-03-30 DIAGNOSIS — M1991 Primary osteoarthritis, unspecified site: Secondary | ICD-10-CM | POA: Diagnosis not present

## 2020-03-30 DIAGNOSIS — G894 Chronic pain syndrome: Secondary | ICD-10-CM | POA: Diagnosis not present

## 2020-03-30 DIAGNOSIS — J449 Chronic obstructive pulmonary disease, unspecified: Secondary | ICD-10-CM | POA: Diagnosis not present

## 2020-03-30 DIAGNOSIS — K529 Noninfective gastroenteritis and colitis, unspecified: Secondary | ICD-10-CM | POA: Diagnosis not present

## 2020-03-30 DIAGNOSIS — Z6823 Body mass index (BMI) 23.0-23.9, adult: Secondary | ICD-10-CM | POA: Diagnosis not present

## 2020-03-31 DIAGNOSIS — J439 Emphysema, unspecified: Secondary | ICD-10-CM | POA: Diagnosis not present

## 2020-03-31 DIAGNOSIS — J449 Chronic obstructive pulmonary disease, unspecified: Secondary | ICD-10-CM | POA: Diagnosis not present

## 2020-03-31 DIAGNOSIS — J42 Unspecified chronic bronchitis: Secondary | ICD-10-CM | POA: Diagnosis not present

## 2020-04-04 DIAGNOSIS — J449 Chronic obstructive pulmonary disease, unspecified: Secondary | ICD-10-CM | POA: Diagnosis not present

## 2020-04-04 DIAGNOSIS — J42 Unspecified chronic bronchitis: Secondary | ICD-10-CM | POA: Diagnosis not present

## 2020-04-07 DIAGNOSIS — J441 Chronic obstructive pulmonary disease with (acute) exacerbation: Secondary | ICD-10-CM | POA: Diagnosis not present

## 2020-04-20 DIAGNOSIS — Z720 Tobacco use: Secondary | ICD-10-CM | POA: Diagnosis not present

## 2020-04-20 DIAGNOSIS — M1991 Primary osteoarthritis, unspecified site: Secondary | ICD-10-CM | POA: Diagnosis not present

## 2020-04-20 DIAGNOSIS — I2510 Atherosclerotic heart disease of native coronary artery without angina pectoris: Secondary | ICD-10-CM | POA: Diagnosis not present

## 2020-04-20 DIAGNOSIS — J449 Chronic obstructive pulmonary disease, unspecified: Secondary | ICD-10-CM | POA: Diagnosis not present

## 2020-05-01 DIAGNOSIS — G894 Chronic pain syndrome: Secondary | ICD-10-CM | POA: Diagnosis not present

## 2020-05-01 DIAGNOSIS — Z6823 Body mass index (BMI) 23.0-23.9, adult: Secondary | ICD-10-CM | POA: Diagnosis not present

## 2020-05-01 DIAGNOSIS — Z0000 Encounter for general adult medical examination without abnormal findings: Secondary | ICD-10-CM | POA: Diagnosis not present

## 2020-05-01 DIAGNOSIS — J42 Unspecified chronic bronchitis: Secondary | ICD-10-CM | POA: Diagnosis not present

## 2020-05-01 DIAGNOSIS — J449 Chronic obstructive pulmonary disease, unspecified: Secondary | ICD-10-CM | POA: Diagnosis not present

## 2020-05-01 DIAGNOSIS — M1991 Primary osteoarthritis, unspecified site: Secondary | ICD-10-CM | POA: Diagnosis not present

## 2020-05-01 DIAGNOSIS — J439 Emphysema, unspecified: Secondary | ICD-10-CM | POA: Diagnosis not present

## 2020-05-01 DIAGNOSIS — I10 Essential (primary) hypertension: Secondary | ICD-10-CM | POA: Diagnosis not present

## 2020-05-08 DIAGNOSIS — J441 Chronic obstructive pulmonary disease with (acute) exacerbation: Secondary | ICD-10-CM | POA: Diagnosis not present

## 2020-05-19 DIAGNOSIS — M1991 Primary osteoarthritis, unspecified site: Secondary | ICD-10-CM | POA: Diagnosis not present

## 2020-05-19 DIAGNOSIS — Z720 Tobacco use: Secondary | ICD-10-CM | POA: Diagnosis not present

## 2020-05-19 DIAGNOSIS — J449 Chronic obstructive pulmonary disease, unspecified: Secondary | ICD-10-CM | POA: Diagnosis not present

## 2020-05-19 DIAGNOSIS — I2510 Atherosclerotic heart disease of native coronary artery without angina pectoris: Secondary | ICD-10-CM | POA: Diagnosis not present

## 2020-06-01 DIAGNOSIS — Z6824 Body mass index (BMI) 24.0-24.9, adult: Secondary | ICD-10-CM | POA: Diagnosis not present

## 2020-06-01 DIAGNOSIS — I700 Atherosclerosis of aorta: Secondary | ICD-10-CM | POA: Diagnosis not present

## 2020-06-01 DIAGNOSIS — J439 Emphysema, unspecified: Secondary | ICD-10-CM | POA: Diagnosis not present

## 2020-06-01 DIAGNOSIS — I10 Essential (primary) hypertension: Secondary | ICD-10-CM | POA: Diagnosis not present

## 2020-06-01 DIAGNOSIS — J42 Unspecified chronic bronchitis: Secondary | ICD-10-CM | POA: Diagnosis not present

## 2020-06-01 DIAGNOSIS — J449 Chronic obstructive pulmonary disease, unspecified: Secondary | ICD-10-CM | POA: Diagnosis not present

## 2020-06-08 DIAGNOSIS — J441 Chronic obstructive pulmonary disease with (acute) exacerbation: Secondary | ICD-10-CM | POA: Diagnosis not present

## 2020-06-18 DIAGNOSIS — Z720 Tobacco use: Secondary | ICD-10-CM | POA: Diagnosis not present

## 2020-06-18 DIAGNOSIS — I2510 Atherosclerotic heart disease of native coronary artery without angina pectoris: Secondary | ICD-10-CM | POA: Diagnosis not present

## 2020-06-18 DIAGNOSIS — J449 Chronic obstructive pulmonary disease, unspecified: Secondary | ICD-10-CM | POA: Diagnosis not present

## 2020-06-18 DIAGNOSIS — M1991 Primary osteoarthritis, unspecified site: Secondary | ICD-10-CM | POA: Diagnosis not present

## 2020-06-22 DIAGNOSIS — J449 Chronic obstructive pulmonary disease, unspecified: Secondary | ICD-10-CM | POA: Diagnosis not present

## 2020-06-22 DIAGNOSIS — I10 Essential (primary) hypertension: Secondary | ICD-10-CM | POA: Diagnosis not present

## 2020-06-22 DIAGNOSIS — G894 Chronic pain syndrome: Secondary | ICD-10-CM | POA: Diagnosis not present

## 2020-06-22 DIAGNOSIS — Z6824 Body mass index (BMI) 24.0-24.9, adult: Secondary | ICD-10-CM | POA: Diagnosis not present

## 2020-06-22 DIAGNOSIS — E7849 Other hyperlipidemia: Secondary | ICD-10-CM | POA: Diagnosis not present

## 2020-06-22 DIAGNOSIS — K219 Gastro-esophageal reflux disease without esophagitis: Secondary | ICD-10-CM | POA: Diagnosis not present

## 2020-06-29 DIAGNOSIS — J42 Unspecified chronic bronchitis: Secondary | ICD-10-CM | POA: Diagnosis not present

## 2020-06-29 DIAGNOSIS — J449 Chronic obstructive pulmonary disease, unspecified: Secondary | ICD-10-CM | POA: Diagnosis not present

## 2020-06-29 DIAGNOSIS — J439 Emphysema, unspecified: Secondary | ICD-10-CM | POA: Diagnosis not present

## 2020-07-02 DIAGNOSIS — J439 Emphysema, unspecified: Secondary | ICD-10-CM | POA: Diagnosis not present

## 2020-07-02 DIAGNOSIS — J42 Unspecified chronic bronchitis: Secondary | ICD-10-CM | POA: Diagnosis not present

## 2020-07-02 DIAGNOSIS — J449 Chronic obstructive pulmonary disease, unspecified: Secondary | ICD-10-CM | POA: Diagnosis not present

## 2020-07-06 DIAGNOSIS — J441 Chronic obstructive pulmonary disease with (acute) exacerbation: Secondary | ICD-10-CM | POA: Diagnosis not present

## 2020-07-10 DIAGNOSIS — K219 Gastro-esophageal reflux disease without esophagitis: Secondary | ICD-10-CM | POA: Diagnosis not present

## 2020-07-10 DIAGNOSIS — I10 Essential (primary) hypertension: Secondary | ICD-10-CM | POA: Diagnosis not present

## 2020-07-10 DIAGNOSIS — G894 Chronic pain syndrome: Secondary | ICD-10-CM | POA: Diagnosis not present

## 2020-07-10 DIAGNOSIS — J449 Chronic obstructive pulmonary disease, unspecified: Secondary | ICD-10-CM | POA: Diagnosis not present

## 2020-07-18 ENCOUNTER — Encounter (INDEPENDENT_AMBULATORY_CARE_PROVIDER_SITE_OTHER): Payer: Self-pay | Admitting: *Deleted

## 2020-07-18 DIAGNOSIS — J449 Chronic obstructive pulmonary disease, unspecified: Secondary | ICD-10-CM | POA: Diagnosis not present

## 2020-07-18 DIAGNOSIS — Z720 Tobacco use: Secondary | ICD-10-CM | POA: Diagnosis not present

## 2020-07-18 DIAGNOSIS — I2510 Atherosclerotic heart disease of native coronary artery without angina pectoris: Secondary | ICD-10-CM | POA: Diagnosis not present

## 2020-07-30 DIAGNOSIS — J449 Chronic obstructive pulmonary disease, unspecified: Secondary | ICD-10-CM | POA: Diagnosis not present

## 2020-07-30 DIAGNOSIS — J42 Unspecified chronic bronchitis: Secondary | ICD-10-CM | POA: Diagnosis not present

## 2020-07-30 DIAGNOSIS — J439 Emphysema, unspecified: Secondary | ICD-10-CM | POA: Diagnosis not present

## 2020-07-31 DIAGNOSIS — J449 Chronic obstructive pulmonary disease, unspecified: Secondary | ICD-10-CM | POA: Diagnosis not present

## 2020-07-31 DIAGNOSIS — J439 Emphysema, unspecified: Secondary | ICD-10-CM | POA: Diagnosis not present

## 2020-07-31 DIAGNOSIS — J42 Unspecified chronic bronchitis: Secondary | ICD-10-CM | POA: Diagnosis not present

## 2020-08-06 DIAGNOSIS — J441 Chronic obstructive pulmonary disease with (acute) exacerbation: Secondary | ICD-10-CM | POA: Diagnosis not present

## 2020-08-07 DIAGNOSIS — G894 Chronic pain syndrome: Secondary | ICD-10-CM | POA: Diagnosis not present

## 2020-08-07 DIAGNOSIS — J301 Allergic rhinitis due to pollen: Secondary | ICD-10-CM | POA: Diagnosis not present

## 2020-08-29 DIAGNOSIS — J439 Emphysema, unspecified: Secondary | ICD-10-CM | POA: Diagnosis not present

## 2020-08-29 DIAGNOSIS — J42 Unspecified chronic bronchitis: Secondary | ICD-10-CM | POA: Diagnosis not present

## 2020-08-29 DIAGNOSIS — J449 Chronic obstructive pulmonary disease, unspecified: Secondary | ICD-10-CM | POA: Diagnosis not present

## 2020-08-30 DIAGNOSIS — J42 Unspecified chronic bronchitis: Secondary | ICD-10-CM | POA: Diagnosis not present

## 2020-08-30 DIAGNOSIS — J449 Chronic obstructive pulmonary disease, unspecified: Secondary | ICD-10-CM | POA: Diagnosis not present

## 2020-08-30 DIAGNOSIS — J439 Emphysema, unspecified: Secondary | ICD-10-CM | POA: Diagnosis not present

## 2020-09-05 DIAGNOSIS — J441 Chronic obstructive pulmonary disease with (acute) exacerbation: Secondary | ICD-10-CM | POA: Diagnosis not present

## 2020-09-06 DIAGNOSIS — J9610 Chronic respiratory failure, unspecified whether with hypoxia or hypercapnia: Secondary | ICD-10-CM | POA: Diagnosis not present

## 2020-09-06 DIAGNOSIS — J449 Chronic obstructive pulmonary disease, unspecified: Secondary | ICD-10-CM | POA: Diagnosis not present

## 2020-09-06 DIAGNOSIS — G894 Chronic pain syndrome: Secondary | ICD-10-CM | POA: Diagnosis not present

## 2020-09-18 DIAGNOSIS — Z720 Tobacco use: Secondary | ICD-10-CM | POA: Diagnosis not present

## 2020-09-18 DIAGNOSIS — J449 Chronic obstructive pulmonary disease, unspecified: Secondary | ICD-10-CM | POA: Diagnosis not present

## 2020-09-18 DIAGNOSIS — I2510 Atherosclerotic heart disease of native coronary artery without angina pectoris: Secondary | ICD-10-CM | POA: Diagnosis not present

## 2020-09-29 DIAGNOSIS — J42 Unspecified chronic bronchitis: Secondary | ICD-10-CM | POA: Diagnosis not present

## 2020-09-29 DIAGNOSIS — J439 Emphysema, unspecified: Secondary | ICD-10-CM | POA: Diagnosis not present

## 2020-09-29 DIAGNOSIS — J449 Chronic obstructive pulmonary disease, unspecified: Secondary | ICD-10-CM | POA: Diagnosis not present

## 2020-10-01 DIAGNOSIS — J42 Unspecified chronic bronchitis: Secondary | ICD-10-CM | POA: Diagnosis not present

## 2020-10-01 DIAGNOSIS — J449 Chronic obstructive pulmonary disease, unspecified: Secondary | ICD-10-CM | POA: Diagnosis not present

## 2020-10-01 DIAGNOSIS — J439 Emphysema, unspecified: Secondary | ICD-10-CM | POA: Diagnosis not present

## 2020-10-04 DIAGNOSIS — G894 Chronic pain syndrome: Secondary | ICD-10-CM | POA: Diagnosis not present

## 2020-10-04 DIAGNOSIS — K222 Esophageal obstruction: Secondary | ICD-10-CM | POA: Diagnosis not present

## 2020-10-04 DIAGNOSIS — I10 Essential (primary) hypertension: Secondary | ICD-10-CM | POA: Diagnosis not present

## 2020-10-06 DIAGNOSIS — J441 Chronic obstructive pulmonary disease with (acute) exacerbation: Secondary | ICD-10-CM | POA: Diagnosis not present

## 2020-10-18 DIAGNOSIS — J449 Chronic obstructive pulmonary disease, unspecified: Secondary | ICD-10-CM | POA: Diagnosis not present

## 2020-10-18 DIAGNOSIS — I2510 Atherosclerotic heart disease of native coronary artery without angina pectoris: Secondary | ICD-10-CM | POA: Diagnosis not present

## 2020-10-26 DIAGNOSIS — J329 Chronic sinusitis, unspecified: Secondary | ICD-10-CM | POA: Diagnosis not present

## 2020-10-26 DIAGNOSIS — Z0000 Encounter for general adult medical examination without abnormal findings: Secondary | ICD-10-CM | POA: Diagnosis not present

## 2020-10-26 DIAGNOSIS — J449 Chronic obstructive pulmonary disease, unspecified: Secondary | ICD-10-CM | POA: Diagnosis not present

## 2020-10-26 DIAGNOSIS — Z681 Body mass index (BMI) 19 or less, adult: Secondary | ICD-10-CM | POA: Diagnosis not present

## 2020-10-26 DIAGNOSIS — E039 Hypothyroidism, unspecified: Secondary | ICD-10-CM | POA: Diagnosis not present

## 2020-10-26 DIAGNOSIS — G894 Chronic pain syndrome: Secondary | ICD-10-CM | POA: Diagnosis not present

## 2020-10-26 DIAGNOSIS — K219 Gastro-esophageal reflux disease without esophagitis: Secondary | ICD-10-CM | POA: Diagnosis not present

## 2020-10-26 DIAGNOSIS — E782 Mixed hyperlipidemia: Secondary | ICD-10-CM | POA: Diagnosis not present

## 2020-10-26 DIAGNOSIS — I10 Essential (primary) hypertension: Secondary | ICD-10-CM | POA: Diagnosis not present

## 2020-10-29 DIAGNOSIS — J439 Emphysema, unspecified: Secondary | ICD-10-CM | POA: Diagnosis not present

## 2020-10-29 DIAGNOSIS — J449 Chronic obstructive pulmonary disease, unspecified: Secondary | ICD-10-CM | POA: Diagnosis not present

## 2020-10-29 DIAGNOSIS — J42 Unspecified chronic bronchitis: Secondary | ICD-10-CM | POA: Diagnosis not present

## 2020-10-30 DIAGNOSIS — J439 Emphysema, unspecified: Secondary | ICD-10-CM | POA: Diagnosis not present

## 2020-10-30 DIAGNOSIS — J42 Unspecified chronic bronchitis: Secondary | ICD-10-CM | POA: Diagnosis not present

## 2020-10-30 DIAGNOSIS — J449 Chronic obstructive pulmonary disease, unspecified: Secondary | ICD-10-CM | POA: Diagnosis not present

## 2020-10-31 ENCOUNTER — Other Ambulatory Visit (INDEPENDENT_AMBULATORY_CARE_PROVIDER_SITE_OTHER): Payer: Self-pay

## 2020-10-31 ENCOUNTER — Telehealth (INDEPENDENT_AMBULATORY_CARE_PROVIDER_SITE_OTHER): Payer: Self-pay

## 2020-10-31 DIAGNOSIS — Z1211 Encounter for screening for malignant neoplasm of colon: Secondary | ICD-10-CM

## 2020-10-31 NOTE — Telephone Encounter (Signed)
Ok to schedule.  Thanks,  Yamaira Spinner Castaneda Mayorga, MD Gastroenterology and Hepatology [facility]  

## 2020-10-31 NOTE — Telephone Encounter (Signed)
Referring MD/PCP: Sherwood Gambler  Procedure: Tcs  Reason/Indication:  Screening  Has patient had this procedure before?  no  If so, when, by whom and where?    Is there a family history of colon cancer?  no  Who?  What age when diagnosed?    Is patient diabetic? If yes, Type 1 or Type 2   no      Does patient have prosthetic heart valve or mechanical valve?  no  Do you have a pacemaker/defibrillator?  no  Has patient ever had endocarditis/atrial fibrillation? no  Does patient use oxygen? no  Has patient had joint replacement within last 12 months?  no  Is patient constipated or do they take laxatives? no  Does patient have a history of alcohol/drug use?  no  Have you had a stroke/heart attack last 6 mths? no  Do you take medicine for weight loss?  no  For male patients,: do you still have your menstrual cycle? N/A  Is patient on blood thinner such as Coumadin, Plavix and/or Aspirin? yes  Medications: asa 81 mg daily, ondanetron 8 mg tid, furosemide 20 mg daily, finasteride 5 mg daily, omeprazole 40 g daily, tamulosin 0.4 mg daily, potassium 20 meq daily, simvastatin 20 mg daily, simvastatin 20 mg daily, tizanidine 4 mg tid, amlodipine 10 mg daily, Symbicort 160/4.5 bid, albuterol inhalation qid prn, albuterol nebulizer prn, arthritis pain relief daily, centrum silver daily, allegra 180 mg daily  Allergies: nkda  Medication Adjustment per Dr Levon Hedger none  Procedure date & time: 11/13/20 9:15

## 2020-11-02 ENCOUNTER — Encounter (INDEPENDENT_AMBULATORY_CARE_PROVIDER_SITE_OTHER): Payer: Self-pay

## 2020-11-02 ENCOUNTER — Telehealth (INDEPENDENT_AMBULATORY_CARE_PROVIDER_SITE_OTHER): Payer: Self-pay

## 2020-11-02 DIAGNOSIS — G894 Chronic pain syndrome: Secondary | ICD-10-CM | POA: Diagnosis not present

## 2020-11-02 DIAGNOSIS — Z6824 Body mass index (BMI) 24.0-24.9, adult: Secondary | ICD-10-CM | POA: Diagnosis not present

## 2020-11-02 DIAGNOSIS — R2242 Localized swelling, mass and lump, left lower limb: Secondary | ICD-10-CM | POA: Diagnosis not present

## 2020-11-02 MED ORDER — PEG 3350-KCL-NA BICARB-NACL 420 G PO SOLR: 4000.0000 mL | ORAL | 0 refills | Status: DC

## 2020-11-02 NOTE — Telephone Encounter (Signed)
[name], CMA  

## 2020-11-06 NOTE — Patient Instructions (Signed)
[name]  11/06/2020     @PREFPERIOPPHARMACY @   Your procedure is scheduled on  11/13/2020.   Report to 11/15/2020 at    0745    A.M.   Call this number if you have problems the morning of surgery:  [phone]   Remember:  Follow the diet and pre instructions given to you by the office.     Take these medicines the morning of surgery with A SIP OF WATER        proscar, claritin, prilosec, zofran (if needed), flomax.     Do not wear jewelry, make-up or nail polish.  Do not wear lotions, powders, or perfumes, or deodorant.  Do not shave 48 hours prior to surgery.  Men may shave face and neck.  Do not bring valuables to the hospital.  [facility] is not responsible for any belongings or valuables.  Contacts, dentures or bridgework may not be worn into surgery.  Leave your suitcase in the car.  After surgery it may be brought to your room.  For patients admitted to the hospital, discharge time will be determined by your treatment team.  Patients discharged the day of surgery will not be allowed to drive home and must have someone with them for 24 hours.    Special instructions:     DO NOT smoke tobacco or vape for 24 hours before your procedure.   Please read over the following fact sheets that you were given. Anesthesia Post-op Instructions and Care and Recovery After Surgery      Colonoscopy, Adult, Care After This sheet gives you information about how to care for yourself after your procedure. Your health care provider may also give you more specific instructions. If you have problems or questions, contact your health careprovider. What can I expect after the procedure? After the procedure, it is common to have: A small amount of blood in your stool for 24 hours after the procedure. Some gas. Mild cramping or bloating of your abdomen. Follow these instructions at home: Eating and drinking  Drink enough fluid to keep your urine pale yellow. Follow  instructions from your health care provider about eating or drinking restrictions. Resume your normal diet as instructed by your health care provider. Avoid heavy or fried foods that are hard to digest.  Activity Rest as told by your health care provider. Avoid sitting for a long time without moving. Get up to take short walks every 1-2 hours. This is important to improve blood flow and breathing. Ask for help if you feel weak or unsteady. Return to your normal activities as told by your health care provider. Ask your health care provider what activities are safe for you. Managing cramping and bloating  Try walking around when you have cramps or feel bloated. Apply heat to your abdomen as told by your health care provider. Use the heat source that your health care provider recommends, such as a moist heat pack or a heating pad. Place a towel between your skin and the heat source. Leave the heat on for 20-30 minutes. Remove the heat if your skin turns bright red. This is especially important if you are unable to feel pain, heat, or cold. You may have a greater risk of getting burned.  General instructions If you were given a sedative during the procedure, it can affect you for several hours. Do not drive or operate machinery until your health care provider says that it is  safe. For the first 24 hours after the procedure: Do not sign important documents. Do not drink alcohol. Do your regular daily activities at a slower pace than normal. Eat soft foods that are easy to digest. Take over-the-counter and prescription medicines only as told by your health care provider. Keep all follow-up visits as told by your health care provider. This is important. Contact a health care provider if: You have blood in your stool 2-3 days after the procedure. Get help right away if you have: More than a small spotting of blood in your stool. Large blood clots in your stool. Swelling of your abdomen. Nausea  or vomiting. A fever. Increasing pain in your abdomen that is not relieved with medicine. Summary After the procedure, it is common to have a small amount of blood in your stool. You may also have mild cramping and bloating of your abdomen. If you were given a sedative during the procedure, it can affect you for several hours. Do not drive or operate machinery until your health care provider says that it is safe. Get help right away if you have a lot of blood in your stool, nausea or vomiting, a fever, or increased pain in your abdomen. This information is not intended to replace advice given to you by your health care provider. Make sure you discuss any questions you have with your healthcare provider. Document Revised: 04/01/2019 Document Reviewed: 11/01/2018 Elsevier Patient Education  2022 [employer] Monitored Anesthesia Care, Care After This sheet gives you information about how to care for yourself after your procedure. Your health care provider may also give you more specific instructions. If you have problems or questions, contact your health careprovider. What can I expect after the procedure? After the procedure, it is common to have: Tiredness. Forgetfulness about what happened after the procedure. Impaired judgment for important decisions. Nausea or vomiting. Some difficulty with balance. Follow these instructions at home: For the time period you were told by your health care provider:     Rest as needed. Do not participate in activities where you could fall or become injured. Do not drive or use machinery. Do not drink alcohol. Do not take sleeping pills or medicines that cause drowsiness. Do not make important decisions or sign legal documents. Do not take care of children on your own. Eating and drinking Follow the diet that is recommended by your health care provider. Drink enough fluid to keep your urine pale yellow. If you vomit: Drink water, juice, or soup  when you can drink without vomiting. Make sure you have little or no nausea before eating solid foods. General instructions Have a responsible adult stay with you for the time you are told. It is important to have someone help care for you until you are awake and alert. Take over-the-counter and prescription medicines only as told by your health care provider. If you have sleep apnea, surgery and certain medicines can increase your risk for breathing problems. Follow instructions from your health care provider about wearing your sleep device: Anytime you are sleeping, including during daytime naps. While taking prescription pain medicines, sleeping medicines, or medicines that make you drowsy. Avoid smoking. Keep all follow-up visits as told by your health care provider. This is important. Contact a health care provider if: You keep feeling nauseous or you keep vomiting. You feel light-headed. You are still sleepy or having trouble with balance after 24 hours. You develop a rash. You have a fever. You have redness or  swelling around the IV site. Get help right away if: You have trouble breathing. You have new-onset confusion at home. Summary For several hours after your procedure, you may feel tired. You may also be forgetful and have poor judgment. Have a responsible adult stay with you for the time you are told. It is important to have someone help care for you until you are awake and alert. Rest as told. Do not drive or operate machinery. Do not drink alcohol or take sleeping pills. Get help right away if you have trouble breathing, or if you suddenly become confused. This information is not intended to replace advice given to you by your health care provider. Make sure you discuss any questions you have with your healthcare provider. Document Revised: 12/22/2019 Document Reviewed: 03/10/2019 Elsevier Patient Education  2022 [employer].

## 2020-11-07 ENCOUNTER — Ambulatory Visit: Payer: Medicare Other | Admitting: Urology

## 2020-11-08 ENCOUNTER — Encounter (HOSPITAL_COMMUNITY)
Admission: RE | Admit: 2020-11-08 | Discharge: 2020-11-08 | Disposition: A | Discharge status: Home / Self Care | Payer: Medicare Other | Source: Ambulatory Visit | Attending: Gastroenterology | Admitting: Gastroenterology

## 2020-11-08 ENCOUNTER — Other Ambulatory Visit: Payer: Self-pay

## 2020-11-08 DIAGNOSIS — Z01818 Encounter for other preprocedural examination: Secondary | ICD-10-CM | POA: Diagnosis not present

## 2020-11-08 LAB — BASIC METABOLIC PANEL
Anion gap: 9 (ref 5–15)
BUN: 21 mg/dL (ref 8–23)
CO2: 24 mmol/L (ref 22–32)
Calcium: 9 mg/dL (ref 8.9–10.3)
Chloride: 104 mmol/L (ref 98–111)
Creatinine, Ser: 1.05 mg/dL (ref 0.61–1.24)
GFR, Estimated: >60 mL/min (ref >60)
Glucose, Bld: 109 mg/dL — ABNORMAL HIGH (ref 70–99)
Potassium: 4 mmol/L (ref 3.5–5.1)
Sodium: 137 mmol/L (ref 135–145)

## 2020-11-13 ENCOUNTER — Encounter (HOSPITAL_COMMUNITY): Payer: Self-pay | Admitting: Gastroenterology

## 2020-11-13 ENCOUNTER — Ambulatory Visit (HOSPITAL_COMMUNITY)
Admission: RE | Admit: 2020-11-13 | Discharge: 2020-11-13 | Disposition: A | Discharge status: Home / Self Care | Payer: Medicare Other | Attending: Gastroenterology | Admitting: Gastroenterology

## 2020-11-13 ENCOUNTER — Other Ambulatory Visit (INDEPENDENT_AMBULATORY_CARE_PROVIDER_SITE_OTHER): Payer: Self-pay

## 2020-11-13 ENCOUNTER — Other Ambulatory Visit: Payer: Self-pay

## 2020-11-13 ENCOUNTER — Ambulatory Visit (HOSPITAL_COMMUNITY): Payer: Medicare Other | Admitting: Certified Registered"

## 2020-11-13 ENCOUNTER — Encounter (HOSPITAL_COMMUNITY)
Admission: RE | Disposition: A | Discharge status: Home / Self Care | Payer: Self-pay | Source: Home / Self Care | Attending: Gastroenterology

## 2020-11-13 ENCOUNTER — Telehealth (INDEPENDENT_AMBULATORY_CARE_PROVIDER_SITE_OTHER): Payer: Self-pay

## 2020-11-13 DIAGNOSIS — Z1211 Encounter for screening for malignant neoplasm of colon: Secondary | ICD-10-CM | POA: Diagnosis not present

## 2020-11-13 DIAGNOSIS — Z87891 Personal history of nicotine dependence: Secondary | ICD-10-CM | POA: Insufficient documentation

## 2020-11-13 DIAGNOSIS — Z9981 Dependence on supplemental oxygen: Secondary | ICD-10-CM | POA: Insufficient documentation

## 2020-11-13 DIAGNOSIS — E785 Hyperlipidemia, unspecified: Secondary | ICD-10-CM | POA: Insufficient documentation

## 2020-11-13 DIAGNOSIS — K621 Rectal polyp: Secondary | ICD-10-CM | POA: Insufficient documentation

## 2020-11-13 DIAGNOSIS — J439 Emphysema, unspecified: Secondary | ICD-10-CM | POA: Insufficient documentation

## 2020-11-13 DIAGNOSIS — Z888 Allergy status to other drugs, medicaments and biological substances status: Secondary | ICD-10-CM | POA: Insufficient documentation

## 2020-11-13 DIAGNOSIS — I10 Essential (primary) hypertension: Secondary | ICD-10-CM | POA: Insufficient documentation

## 2020-11-13 DIAGNOSIS — Z79899 Other long term (current) drug therapy: Secondary | ICD-10-CM | POA: Insufficient documentation

## 2020-11-13 DIAGNOSIS — Z9119 Patient's noncompliance with other medical treatment and regimen: Secondary | ICD-10-CM

## 2020-11-13 DIAGNOSIS — F1120 Opioid dependence, uncomplicated: Secondary | ICD-10-CM | POA: Insufficient documentation

## 2020-11-13 DIAGNOSIS — I2510 Atherosclerotic heart disease of native coronary artery without angina pectoris: Secondary | ICD-10-CM | POA: Insufficient documentation

## 2020-11-13 DIAGNOSIS — Z7982 Long term (current) use of aspirin: Secondary | ICD-10-CM | POA: Insufficient documentation

## 2020-11-13 HISTORY — PX: POLYPECTOMY: SHX5525

## 2020-11-13 HISTORY — PX: FLEXIBLE SIGMOIDOSCOPY: SHX5431

## 2020-11-13 SURGERY — POLYPECTOMY
Anesthesia: General

## 2020-11-13 MED ORDER — LIDOCAINE HCL (CARDIAC) PF 100 MG/5ML IV SOSY
PREFILLED_SYRINGE | INTRAVENOUS | Status: DC | PRN
  Administered 2020-11-13: 50 mg via INTRAVENOUS

## 2020-11-13 MED ORDER — LACTATED RINGERS IV SOLN: INTRAVENOUS | Status: DC

## 2020-11-13 MED ORDER — PEG 3350-KCL-NA BICARB-NACL 420 G PO SOLR: 4000.0000 mL | ORAL | 0 refills | Status: AC

## 2020-11-13 MED ORDER — LACTATED RINGERS IV SOLN
INTRAVENOUS | Status: DC | PRN
  Administered 2020-11-13: 1000 mL via INTRAVENOUS

## 2020-11-13 MED ORDER — PROPOFOL 500 MG/50ML IV EMUL
INTRAVENOUS | Status: DC | PRN
  Administered 2020-11-13: 150 ug/kg/min via INTRAVENOUS

## 2020-11-13 MED ORDER — STERILE WATER FOR IRRIGATION IR SOLN
Status: DC | PRN
  Administered 2020-11-13: 200 mL

## 2020-11-13 MED ORDER — PROPOFOL 10 MG/ML IV BOLUS
INTRAVENOUS | Status: DC | PRN
  Administered 2020-11-13: 100 mg via INTRAVENOUS

## 2020-11-13 NOTE — Anesthesia Preprocedure Evaluation (Signed)
Anesthesia Evaluation  Patient identified by MRN, date of birth, ID band Patient awake    Reviewed: Allergy & Precautions, H&P , NPO status , Patient's Chart, lab work & pertinent test results, reviewed documented beta blocker date and time   Airway Mallampati: II  TM Distance: >3 FB Neck ROM: full    Dental no notable dental hx.    Pulmonary COPD,  oxygen dependent, former smoker,    Pulmonary exam normal breath sounds clear to auscultation       Cardiovascular Exercise Tolerance: Good hypertension, + CAD   Rhythm:regular Rate:Normal     Neuro/Psych Anxiety negative neurological ROS  negative psych ROS   GI/Hepatic Neg liver ROS, GERD  Medicated,  Endo/Other  negative endocrine ROS  Renal/GU negative Renal ROS  negative genitourinary   Musculoskeletal   Abdominal   Peds  Hematology negative hematology ROS (+)   Anesthesia Other Findings Chronic pain syndrome  Reproductive/Obstetrics negative OB ROS                             Anesthesia Physical Anesthesia Plan  ASA: 3  Anesthesia Plan: General   Post-op Pain Management:    Induction:   PONV Risk Score and Plan: Propofol infusion  Airway Management Planned:   Additional Equipment:   Intra-op Plan:   Post-operative Plan:   Informed Consent: I have reviewed the patients History and Physical, chart, labs and discussed the procedure including the risks, benefits and alternatives for the proposed anesthesia with the patient or authorized representative who has indicated his/her understanding and acceptance.     Dental Advisory Given  Plan Discussed with: CRNA  Anesthesia Plan Comments:         Anesthesia Quick Evaluation

## 2020-11-13 NOTE — H&P (Addendum)
[name] is an 70 y.o. male.   Chief Complaint: colorectal cancer screening HPI: 70 year old male with past medical history of coronary artery disease, hyperlipidemia, hypertension, opiate dependence, COPD on home oxygen, coming for screening colonoscopy. The patient has never had a colonoscopy in the past.  The patient denies having any complaints such as melena, hematochezia, abdominal pain or distention, change in her bowel movement consistency or frequency, no changes in her weight recently.  No family history of colorectal cancer.   Past Medical History:  Diagnosis Date   Anxiety    CAD (coronary artery disease)    Chronic pain    COPD (chronic obstructive pulmonary disease) (HCC)    emphysema   Dyslipidemia    History of tobacco abuse    80 pack year history    Hypertension    On home O2    4L N/C   Opioid dependence (HCC)    Respiratory failure (HCC)     Past Surgical History:  Procedure Laterality Date   CARDIAC CATHETERIZATION  08/10/2008   right & left - normal coronaries (Dr. Claudia Desanctis)   NM MYOCAR PERF WALL MOTION  07/2008   bruce myoview - mild ischemia in basal inferoseptal & mid inferoseptal regions; EF 63%   TRANSTHORACIC ECHOCARDIOGRAM  07/2008   EF=>55%; RV mildly dilated; RA mild-mod dilated; mild mitral annular calcification & mild MR; AV mildly sclerotic    Family History  Problem Relation Age of Onset   Leukemia Mother    Suicidality Father    Social History:  reports that he quit smoking about 13 years ago. He started smoking about 46 years ago. He has never used smokeless tobacco. He reports that he does not drink alcohol and does not use drugs.  Allergies:  Allergies  Allergen Reactions   Ativan [Lorazepam] Other (See Comments)    Dry mouth sinus and breathing problems    Trazodone And Nefazodone     Unknown reaction    Medications Prior to Admission  Medication Sig Dispense Refill   albuterol (PROVENTIL HFA;VENTOLIN HFA) 108 (90 BASE)  MCG/ACT inhaler Inhale 2 puffs into the lungs every 6 (six) hours as needed for wheezing or shortness of breath.     albuterol (PROVENTIL) (2.5 MG/3ML) 0.083% nebulizer solution Take 3 mLs (2.5 mg total) by nebulization every 6 (six) hours as needed for wheezing or shortness of breath. (Patient taking differently: Take 2.5 mg by nebulization 3 (three) times daily.) 75 mL 12   aspirin EC 81 MG tablet Take 2 tablets (162 mg total) by mouth daily with breakfast. 30 tablet 11   cephALEXin (KEFLEX) 500 MG capsule Take 500 mg by mouth 4 (four) times daily.     fexofenadine-pseudoephedrine (ALLEGRA-D 24) 180-240 MG 24 hr tablet Take 1 tablet by mouth daily.     finasteride (PROSCAR) 5 MG tablet Take 1 tablet (5 mg total) by mouth daily. 90 tablet 3   furosemide (LASIX) 20 MG tablet Take 1 tablet (20 mg total) by mouth daily. Hold for 1 week (Patient taking differently: Take 20 mg by mouth daily.) 30 tablet 0   guaiFENesin (MUCINEX) 600 MG 12 hr tablet Take 600 mg by mouth 2 (two) times daily as needed (congestion).     loratadine (CLARITIN) 10 MG tablet Take 10 mg by mouth daily.     Multiple Vitamins-Minerals (CENTRUM ADULTS PO) Take 1 capsule by mouth daily.     omeprazole (PRILOSEC) 40 MG capsule Take 40 mg by mouth daily.  ondansetron (ZOFRAN) 8 MG tablet Take 8 mg by mouth every 8 (eight) hours as needed for nausea or vomiting.     oxyCODONE-acetaminophen (PERCOCET) 10-325 MG tablet Take 1 tablet by mouth 4 (four) times daily as needed for pain.     potassium chloride SA (KLOR-CON) 20 MEQ tablet Take 20 mEq by mouth daily.     Propylene Glycol 0.6 % SOLN Place 1 drop into both eyes 2 (two) times daily as needed (dry eyes).     simvastatin (ZOCOR) 20 MG tablet TAKE 1 TABLET BY MOUTH DAILY AT 6 PM. (Patient taking differently: Take 20 mg by mouth daily at 6 PM.) 30 tablet 0   tamsulosin (FLOMAX) 0.4 MG CAPS capsule Take 1 capsule (0.4 mg total) by mouth in the morning and at bedtime. 180 capsule 3    tiZANidine (ZANAFLEX) 4 MG tablet Take 4 mg by mouth 3 (three) times daily as needed for muscle spasms.     amLODipine (NORVASC) 10 MG tablet Take 1 tablet (10 mg total) by mouth daily. 30 tablet 3   polyethylene glycol-electrolytes (TRILYTE) 420 g solution Take 4,000 mLs by mouth as directed. 4000 mL 0    No results found for this or any previous visit (from the past 48 hour(s)). No results found.  Review of Systems  Constitutional: Negative.   HENT: Negative.    Eyes: Negative.   Respiratory: Negative.    Cardiovascular: Negative.   Gastrointestinal: Negative.   Endocrine: Negative.   Genitourinary: Negative.   Musculoskeletal: Negative.   Skin: Negative.   Allergic/Immunologic: Negative.   Neurological: Negative.   Hematological: Negative.   Psychiatric/Behavioral: Negative.     Blood pressure (!) 144/88, pulse 90, temperature 98 F (36.7 C), temperature source Oral, resp. rate 20, height 5\' 9"  (1.753 m), weight 25.8 kg, SpO2 96 %. Physical Exam  GENERAL: The patient is AO x3, in no acute distress. HEENT: Head is normocephalic and atraumatic. EOMI are intact. Mouth is well hydrated and without lesions. NECK: Supple. No masses LUNGS: Clear to auscultation. No presence of rhonchi/wheezing/rales. Adequate chest expansion HEART: RRR, normal s1 and s2. ABDOMEN: Soft, nontender, no guarding, no peritoneal signs, and nondistended. BS +. No masses. EXTREMITIES: Without any cyanosis, clubbing, rash, lesions or edema. NEUROLOGIC: AOx3, no focal motor deficit. SKIN: no jaundice, no rashes  Assessment/Plan 70 year old male with past medical history of coronary artery disease, hyperlipidemia, hypertension, opiate dependence, COPD on home oxygen, coming for screening colonoscopy. ,The patient is at average risk for colorectal cancer.  We will proceed with colonoscopy today.   65, MD 11/13/2020, 9:16 AM

## 2020-11-13 NOTE — Anesthesia Procedure Notes (Signed)
Date/Time: 11/13/2020 9:25 AM Performed by: Julian Reil, CRNA Pre-anesthesia Checklist: Patient identified, Emergency Drugs available, Suction available and Patient being monitored Patient Re-evaluated:Patient Re-evaluated prior to induction Oxygen Delivery Method: Nasal cannula Induction Type: IV induction Placement Confirmation: positive ETCO2

## 2020-11-13 NOTE — Op Note (Signed)
[facility] Patient Name: [name] Procedure Date: 11/13/2020 9:07 AM MRN: [id number] Date of Birth: 23-Feb-1951 Attending MD: Katrinka Blazing ,  CSN: [id number] Age: 70 Admit Type: Outpatient Procedure:                Flexible Sigmoidoscopy Indications:              Screening for malignant neoplasm in the colon Providers:                Katrinka Blazing, Sheran Fava, Pandora Leiter,                            Technician Referring MD:              Medicines:                Monitored Anesthesia Care Complications:            No immediate complications. Estimated Blood Loss:     Estimated blood loss: none. Procedure:                Pre-Anesthesia Assessment:                           - Prior to the procedure, a History and Physical                            was performed, and patient medications, allergies                            and sensitivities were reviewed. The patient's                            tolerance of previous anesthesia was reviewed.                           - The risks and benefits of the procedure and the                            sedation options and risks were discussed with the                            patient. All questions were answered and informed                            consent was obtained.                           - ASA Grade Assessment: III - A patient with severe                            systemic disease.                           The PCF-HQ190L ([id number]) scope was introduced                            through the anus and advanced to the the left  transverse colon. The patient tolerated the                            procedure well. The flexible sigmoidoscopy was                            performed with difficulty due to poor bowel prep.                            The quality of the bowel preparation was poor. Scope In: 9:23:05 AM Scope Out: 9:30:42 AM Total Procedure Duration: 0 hours 7 minutes 37  seconds  Findings:      The perianal and digital rectal examinations were normal.      A large amount of stool was found in the rectum, in the sigmoid colon,       in the descending colon, at the splenic flexure and in the transverse       colon, interfering with visualization. Irrigation was perfromed but       adequate visualization could only be achieve up to the sigmoid.      A 8 mm polyp was found in the rectum. The polyp was sessile. The polyp       was removed with a cold snare. Resection and retrieval were complete.       However, the polyp was not suctioned into the exterior trap (it was not       placed prior to suction) and the polyp could not be found afterwards in       the suctioned material.      Normal retroflexion. Impression:               - Preparation of the colon was poor.                           - Stool in the rectum, in the sigmoid colon, in the                            descending colon, at the splenic flexure and in the                            transverse colon.                           - One 8 mm polyp in the rectum, removed with a cold                            snare. Resected and retrieved. Moderate Sedation:      Per Anesthesia Care Recommendation:           - Discharge patient to home (ambulatory).                           - Resume previous diet.                           - Await pathology results.                           -  Repeat colonoscopy at the next available                            appointment because the bowel preparation was poor. Procedure Code(s):        --- Professional ---                           [phone], Sigmoidoscopy, flexible; with removal of                            tumor(s), polyp(s), or other lesion(s) by snare                            technique Diagnosis Code(s):        --- Professional ---                           Z12.11, Encounter for screening for malignant                            neoplasm of colon                            K62.1, Rectal polyp CPT copyright 2019 American [employer]. All rights reserved. The codes documented in this report are preliminary and upon coder review may  be revised to meet current compliance requirements. Katrinka Blazing, MD Katrinka Blazing,  11/13/2020 9:39:44 AM This report has been signed electronically. Number of Addenda: 0

## 2020-11-13 NOTE — Telephone Encounter (Signed)
[name], CMA  

## 2020-11-13 NOTE — Anesthesia Postprocedure Evaluation (Signed)
Anesthesia Post Note  Patient: [name]  Procedure(s) Performed: POLYPECTOMY FLEXIBLE SIGMOIDOSCOPY  Patient location during evaluation: Phase II Anesthesia Type: General Level of consciousness: awake Pain management: pain level controlled Vital Signs Assessment: post-procedure vital signs reviewed and stable Respiratory status: spontaneous breathing and respiratory function stable Cardiovascular status: blood pressure returned to baseline and stable Postop Assessment: no headache and no apparent nausea or vomiting Anesthetic complications: no Comments: Late entry   No notable events documented.   Last Vitals:  Vitals:   11/13/20 0807 11/13/20 0936  BP: (!) 144/88 120/83  Pulse: 90 78  Resp: 20 16  Temp: 36.7 C 36.5 C  SpO2: 96% 98%    Last Pain:  Vitals:   11/13/20 0936  TempSrc: Oral  PainSc: 5                  Windell Norfolk

## 2020-11-13 NOTE — Transfer of Care (Signed)
Immediate Anesthesia Transfer of Care Note  Patient: [name]  Procedure(s) Performed: POLYPECTOMY FLEXIBLE SIGMOIDOSCOPY  Patient Location: Short Stay  Anesthesia Type:General  Level of Consciousness: awake, alert  and oriented  Airway & Oxygen Therapy: Patient Spontanous Breathing  Post-op Assessment: Report given to RN, Post -op Vital signs reviewed and stable and Patient moving all extremities X 4  Post vital signs: Reviewed and stable  Last Vitals:  Vitals Value Taken Time  BP 120/83 11/13/20 0936  Temp 36.5 C 11/13/20 0936  Pulse 78 11/13/20 0936  Resp 16 11/13/20 0936  SpO2 98 % 11/13/20 0936    Last Pain:  Vitals:   11/13/20 0936  TempSrc: Oral  PainSc: 5          Complications: No notable events documented.

## 2020-11-13 NOTE — Discharge Instructions (Addendum)
You are being discharged to home.  Resume your previous diet. ( CLEAR LIQUID diet for procedure tomorrow ) We are waiting for your pathology results.  Your physician has recommended a repeat colonoscopy at the next available appointment because the bowel preparation was poor.

## 2020-11-14 ENCOUNTER — Encounter (HOSPITAL_COMMUNITY): Payer: Self-pay | Admitting: Anesthesiology

## 2020-11-14 ENCOUNTER — Encounter (HOSPITAL_COMMUNITY): Admission: RE | Payer: Self-pay | Source: Home / Self Care

## 2020-11-14 ENCOUNTER — Ambulatory Visit (HOSPITAL_COMMUNITY): Admission: RE | Admit: 2020-11-14 | Payer: Medicare Other | Source: Home / Self Care | Admitting: Gastroenterology

## 2020-11-14 ENCOUNTER — Encounter (HOSPITAL_COMMUNITY): Payer: Self-pay | Admitting: Gastroenterology

## 2020-11-14 SURGERY — COLONOSCOPY WITH PROPOFOL
Anesthesia: Monitor Anesthesia Care

## 2020-11-16 ENCOUNTER — Encounter (HOSPITAL_COMMUNITY): Payer: Self-pay | Admitting: Gastroenterology

## 2020-11-18 DIAGNOSIS — I2510 Atherosclerotic heart disease of native coronary artery without angina pectoris: Secondary | ICD-10-CM | POA: Diagnosis not present

## 2020-11-18 DIAGNOSIS — J449 Chronic obstructive pulmonary disease, unspecified: Secondary | ICD-10-CM | POA: Diagnosis not present

## 2020-11-26 DIAGNOSIS — G894 Chronic pain syndrome: Secondary | ICD-10-CM | POA: Diagnosis not present

## 2020-11-26 DIAGNOSIS — J449 Chronic obstructive pulmonary disease, unspecified: Secondary | ICD-10-CM | POA: Diagnosis not present

## 2020-11-26 DIAGNOSIS — I10 Essential (primary) hypertension: Secondary | ICD-10-CM | POA: Diagnosis not present

## 2020-11-26 DIAGNOSIS — Z6824 Body mass index (BMI) 24.0-24.9, adult: Secondary | ICD-10-CM | POA: Diagnosis not present

## 2020-11-26 DIAGNOSIS — K219 Gastro-esophageal reflux disease without esophagitis: Secondary | ICD-10-CM | POA: Diagnosis not present

## 2020-11-29 DIAGNOSIS — J439 Emphysema, unspecified: Secondary | ICD-10-CM | POA: Diagnosis not present

## 2020-11-29 DIAGNOSIS — J42 Unspecified chronic bronchitis: Secondary | ICD-10-CM | POA: Diagnosis not present

## 2020-11-29 DIAGNOSIS — J449 Chronic obstructive pulmonary disease, unspecified: Secondary | ICD-10-CM | POA: Diagnosis not present

## 2020-12-03 DIAGNOSIS — J449 Chronic obstructive pulmonary disease, unspecified: Secondary | ICD-10-CM | POA: Diagnosis not present

## 2020-12-03 DIAGNOSIS — J42 Unspecified chronic bronchitis: Secondary | ICD-10-CM | POA: Diagnosis not present

## 2020-12-03 DIAGNOSIS — J439 Emphysema, unspecified: Secondary | ICD-10-CM | POA: Diagnosis not present

## 2020-12-17 ENCOUNTER — Telehealth: Payer: Self-pay

## 2020-12-17 ENCOUNTER — Other Ambulatory Visit: Payer: Self-pay

## 2020-12-17 DIAGNOSIS — N401 Enlarged prostate with lower urinary tract symptoms: Secondary | ICD-10-CM

## 2020-12-17 MED ORDER — TAMSULOSIN HCL 0.4 MG PO CAPS: 0.4000 mg | ORAL_CAPSULE | Freq: Two times a day (BID) | ORAL | 3 refills | Status: DC

## 2020-12-17 NOTE — Telephone Encounter (Signed)
Flomax refilled.

## 2020-12-17 NOTE — Telephone Encounter (Signed)
Patient is out of Rx's. Needing a refill on:  tamsulosin (FLOMAX) 0.4 MG CAPS capsule furosemide (LASIX) 20 MG tablet  Please call into:  [facility] - [place], [place] - [address] Phone:  [phone]  Fax:  [phone]    Thanks, Rosey Bath

## 2020-12-18 ENCOUNTER — Other Ambulatory Visit: Payer: Self-pay

## 2020-12-18 ENCOUNTER — Encounter: Payer: Self-pay | Admitting: Urology

## 2020-12-18 ENCOUNTER — Telehealth (INDEPENDENT_AMBULATORY_CARE_PROVIDER_SITE_OTHER): Payer: Medicare Other | Admitting: Urology

## 2020-12-18 DIAGNOSIS — N401 Enlarged prostate with lower urinary tract symptoms: Secondary | ICD-10-CM | POA: Diagnosis not present

## 2020-12-18 DIAGNOSIS — R339 Retention of urine, unspecified: Secondary | ICD-10-CM

## 2020-12-18 DIAGNOSIS — N5201 Erectile dysfunction due to arterial insufficiency: Secondary | ICD-10-CM | POA: Diagnosis not present

## 2020-12-18 MED ORDER — SILDENAFIL CITRATE 100 MG PO TABS: 100.0000 mg | ORAL_TABLET | Freq: Every day | ORAL | 6 refills | Status: AC | PRN

## 2020-12-18 MED ORDER — FINASTERIDE 5 MG PO TABS: 5.0000 mg | ORAL_TABLET | Freq: Every day | ORAL | 3 refills | Status: DC

## 2020-12-18 MED ORDER — TAMSULOSIN HCL 0.4 MG PO CAPS: 0.4000 mg | ORAL_CAPSULE | Freq: Two times a day (BID) | ORAL | 3 refills | Status: DC

## 2020-12-18 NOTE — Patient Instructions (Signed)
Benign Prostatic Hyperplasia  Benign prostatic hyperplasia (BPH) is an enlarged prostate gland that is caused by the normal aging process and not by cancer. The prostate is a walnut-sized gland that is involved in the production of semen. It is located in front of the rectum and below the bladder. The bladder stores urine and the urethra is the tube that carries the urine out of the body. The prostate may get bigger asa man gets older. An enlarged prostate can press on the urethra. This can make it harder to pass urine. The build-up of urine in the bladder can cause infection. Back pressure and infection may progress to bladder damage and kidney (renal) failure. What are the causes? This condition is part of a normal aging process. However, not all men develop problems from this condition. If the prostate enlarges away from the urethra, urine flow will not be blocked. If it enlarges toward the urethra andcompresses it, there will be problems passing urine. What increases the risk? This condition is more likely to develop in men over the age of 50 years. What are the signs or symptoms? Symptoms of this condition include: Getting up often during the night to urinate. Needing to urinate frequently during the day. Difficulty starting urine flow. Decrease in size and strength of your urine stream. Leaking (dribbling) after urinating. Inability to pass urine. This needs immediate treatment. Inability to completely empty your bladder. Pain when you pass urine. This is more common if there is also an infection. Urinary tract infection (UTI). How is this diagnosed? This condition is diagnosed based on your medical history, a physical exam, and your symptoms. Tests will also be done, such as: A post-void bladder scan. This measures any amount of urine that may remain in your bladder after you finish urinating. A digital rectal exam. In a rectal exam, your health care provider checks your prostate by  putting a lubricated, gloved finger into your rectum to feel the back of your prostate gland. This exam detects the size of your gland and any abnormal lumps or growths. An exam of your urine (urinalysis). A prostate specific antigen (PSA) screening. This is a blood test used to screen for prostate cancer. An ultrasound. This test uses sound waves to electronically produce a picture of your prostate gland. Your health care provider may refer you to a specialist in kidney and prostate diseases (urologist). How is this treated? Once symptoms begin, your health care provider will monitor your condition (active surveillance or watchful waiting). Treatment for this condition will depend on the severity of your condition. Treatment may include: Observation and yearly exams. This may be the only treatment needed if your condition and symptoms are mild. Medicines to relieve your symptoms, including: Medicines to shrink the prostate. Medicines to relax the muscle of the prostate. Surgery in severe cases. Surgery may include: Prostatectomy. In this procedure, the prostate tissue is removed completely through an open incision or with a laparoscope or robotics. Transurethral resection of the prostate (TURP). In this procedure, a tool is inserted through the opening at the tip of the penis (urethra). It is used to cut away tissue of the inner core of the prostate. The pieces are removed through the same opening of the penis. This removes the blockage. Transurethral incision (TUIP). In this procedure, small cuts are made in the prostate. This lessens the prostate's pressure on the urethra. Transurethral microwave thermotherapy (TUMT). This procedure uses microwaves to create heat. The heat destroys and removes a small   amount of prostate tissue. Transurethral needle ablation (TUNA). This procedure uses radio frequencies to destroy and remove a small amount of prostate tissue. Interstitial laser coagulation (ILC).  This procedure uses a laser to destroy and remove a small amount of prostate tissue. Transurethral electrovaporization (TUVP). This procedure uses electrodes to destroy and remove a small amount of prostate tissue. Prostatic urethral lift. This procedure inserts an implant to push the lobes of the prostate away from the urethra. Follow these instructions at home: Take over-the-counter and prescription medicines only as told by your health care provider. Monitor your symptoms for any changes. Contact your health care provider with any changes. Avoid drinking large amounts of liquid before going to bed or out in public. Avoid or reduce how much caffeine or alcohol you drink. Give yourself time when you urinate. Keep all follow-up visits as told by your health care provider. This is important. Contact a health care provider if: You have unexplained back pain. Your symptoms do not get better with treatment. You develop side effects from the medicine you are taking. Your urine becomes very dark or has a bad smell. Your lower abdomen becomes distended and you have trouble passing your urine. Get help right away if: You have a fever or chills. You suddenly cannot urinate. You feel lightheaded, or very dizzy, or you faint. There are large amounts of blood or clots in the urine. Your urinary problems become hard to manage. You develop moderate to severe low back or flank pain. The flank is the side of your body between the ribs and the hip. These symptoms may represent a serious problem that is an emergency. Do not wait to see if the symptoms will go away. Get medical help right away. Call your local emergency services (911 in the [place] Do not drive yourself to the hospital. Summary Benign prostatic hyperplasia (BPH) is an enlarged prostate that is caused by the normal aging process and not by cancer. An enlarged prostate can press on the urethra. This can make it hard to pass urine. This  condition is part of a normal aging process and is more likely to develop in men over the age of 50 years. Get help right away if you suddenly cannot urinate. This information is not intended to replace advice given to you by your health care provider. Make sure you discuss any questions you have with your healthcare provider. Document Revised: 12/15/2019 Document Reviewed: 12/15/2019 Elsevier Patient Education  2022 [employer]  

## 2020-12-18 NOTE — Progress Notes (Signed)
12/18/2020 3:48 PM   [name] 02/25/1951 [id number]  Referring provider: Elfredia Nevins, MD [address] [place],  [place] [address]  Patient location: home Physician location: office I connected with  [name] on 12/18/20 by a video enabled telemedicine application and verified that I am speaking with the correct person using two identifiers.   I discussed the limitations of evaluation and management by telemedicine. The patient expressed understanding and agreed to proceed.    Followup BPH and erectile dysfunction   HPI: [name] is a 70yo here for followup for BPH and erectile dysfunction. He is on flomax BID and finasteride. He has stable LUTS. Nocturia 1x. Urine stream strong. He tried tadalafil 20mg  prn which failed to give him a firm erection. No other complaints today   PMH: Past Medical History:  Diagnosis Date   Anxiety    CAD (coronary artery disease)    Chronic pain    COPD (chronic obstructive pulmonary disease) (HCC)    emphysema   Dyslipidemia    History of tobacco abuse    80 pack year history    Hypertension    On home O2    4L N/C   Opioid dependence (HCC)    Respiratory failure (HCC)     Surgical History: Past Surgical History:  Procedure Laterality Date   CARDIAC CATHETERIZATION  08/10/2008   right & left - normal coronaries (Dr. 08/12/2008)   FLEXIBLE SIGMOIDOSCOPY  11/13/2020   Procedure: FLEXIBLE SIGMOIDOSCOPY;  Surgeon: 11/15/2020, Marguerita Merles, MD;  Location: [facility];  Service: Gastroenterology;;   NM MYOCAR PERF WALL MOTION  07/2008   bruce myoview - mild ischemia in basal inferoseptal & mid inferoseptal regions; EF 63%   POLYPECTOMY  11/13/2020   Procedure: POLYPECTOMY;  Surgeon: 11/15/2020, MD;  Location: [facility];  Service: Gastroenterology;;   TRANSTHORACIC ECHOCARDIOGRAM  07/2008   EF=>55%; RV mildly dilated; RA mild-mod dilated; mild mitral annular calcification & mild Edward; AV mildly  sclerotic    Home Medications:  Allergies as of 12/18/2020       Reactions   Ativan [lorazepam] Other (See Comments)   Dry mouth sinus and breathing problems   Trazodone And Nefazodone    Unknown reaction        Medication List        Accurate as of December 18, 2020  3:48 PM. If you have any questions, ask your nurse or doctor.          albuterol 108 (90 Base) MCG/ACT inhaler Commonly known as: VENTOLIN HFA Inhale 2 puffs into the lungs every 6 (six) hours as needed for wheezing or shortness of breath. What changed: Another medication with the same name was changed. Make sure you understand how and when to take each.   albuterol (2.5 MG/3ML) 0.083% nebulizer solution Commonly known as: PROVENTIL Take 3 mLs (2.5 mg total) by nebulization every 6 (six) hours as needed for wheezing or shortness of breath. What changed: when to take this   amLODipine 10 MG tablet Commonly known as: NORVASC Take 1 tablet (10 mg total) by mouth daily.   aspirin EC 81 MG tablet Take 2 tablets (162 mg total) by mouth daily with breakfast.   CENTRUM ADULTS PO Take 1 capsule by mouth daily.   cephALEXin 500 MG capsule Commonly known as: KEFLEX Take 500 mg by mouth 4 (four) times daily.   fexofenadine-pseudoephedrine 180-240 MG 24 hr tablet Commonly known as: ALLEGRA-D 24 Take 1  tablet by mouth daily.   finasteride 5 MG tablet Commonly known as: PROSCAR Take 1 tablet (5 mg total) by mouth daily.   furosemide 20 MG tablet Commonly known as: LASIX Take 1 tablet (20 mg total) by mouth daily. Hold for 1 week What changed: additional instructions   guaiFENesin 600 MG 12 hr tablet Commonly known as: MUCINEX Take 600 mg by mouth 2 (two) times daily as needed (congestion).   loratadine 10 MG tablet Commonly known as: CLARITIN Take 10 mg by mouth daily.   omeprazole 40 MG capsule Commonly known as: PRILOSEC Take 40 mg by mouth daily.   ondansetron 8 MG tablet Commonly known as:  ZOFRAN Take 8 mg by mouth every 8 (eight) hours as needed for nausea or vomiting.   oxyCODONE-acetaminophen 10-325 MG tablet Commonly known as: PERCOCET Take 1 tablet by mouth 4 (four) times daily as needed for pain.   polyethylene glycol-electrolytes 420 g solution Commonly known as: TriLyte Take 4,000 mLs by mouth as directed.   potassium chloride SA 20 MEQ tablet Commonly known as: KLOR-CON Take 20 mEq by mouth daily.   Propylene Glycol 0.6 % Soln Place 1 drop into both eyes 2 (two) times daily as needed (dry eyes).   simvastatin 20 MG tablet Commonly known as: ZOCOR TAKE 1 TABLET BY MOUTH DAILY AT 6 PM. What changed: when to take this   tamsulosin 0.4 MG Caps capsule Commonly known as: FLOMAX Take 1 capsule (0.4 mg total) by mouth in the morning and at bedtime.   tiZANidine 4 MG tablet Commonly known as: ZANAFLEX Take 4 mg by mouth 3 (three) times daily as needed for muscle spasms.        Allergies:  Allergies  Allergen Reactions   Ativan [Lorazepam] Other (See Comments)    Dry mouth sinus and breathing problems    Trazodone And Nefazodone     Unknown reaction    Family History: Family History  Problem Relation Age of Onset   Leukemia Mother    Suicidality Father     Social History:  reports that he quit smoking about 13 years ago. He started smoking about 46 years ago. He has never used smokeless tobacco. He reports that he does not drink alcohol and does not use drugs.  ROS: All other review of systems were reviewed and are negative except what is noted above in HPI   Laboratory Data: Lab Results  Component Value Date   WBC 11.3 (H) 02/28/2020   HGB 13.5 02/28/2020   HCT 41.2 02/28/2020   MCV 94.5 02/28/2020   PLT 390 02/28/2020    Lab Results  Component Value Date   CREATININE 1.05 11/08/2020    Lab Results  Component Value Date   PSA 0.9 11/07/2019    No results found for: TESTOSTERONE  No results found for:  HGBA1C  Urinalysis    Component Value Date/Time   COLORURINE YELLOW 02/25/2020 1531   APPEARANCEUR CLEAR 02/25/2020 1531   LABSPEC >1.046 (H) 02/25/2020 1531   PHURINE 5.0 02/25/2020 1531   GLUCOSEU NEGATIVE 02/25/2020 1531   HGBUR NEGATIVE 02/25/2020 1531   BILIRUBINUR NEGATIVE 02/25/2020 1531   KETONESUR 20 (A) 02/25/2020 1531   PROTEINUR NEGATIVE 02/25/2020 1531   NITRITE NEGATIVE 02/25/2020 1531   LEUKOCYTESUR NEGATIVE 02/25/2020 1531    Lab Results  Component Value Date   BACTERIA RARE (A) 06/30/2017    Pertinent Imaging:  No results found for this or any previous visit.  No results found for  this or any previous visit.  No results found for this or any previous visit.  No results found for this or any previous visit.  No results found for this or any previous visit.  No results found for this or any previous visit.  No results found for this or any previous visit.  No results found for this or any previous visit.   Assessment & Plan:    1. Benign localized prostatic hyperplasia with lower urinary tract symptoms (LUTS) -Continue flomax BID and finasteride  2. Urinary retention -continue flomax BID and finasteride  3. Erectile dysfunction due to arterial insufficiency -sildenafil 100mg  prn   No follow-ups on file.  , MD  [facility] [place]

## 2020-12-27 DIAGNOSIS — I10 Essential (primary) hypertension: Secondary | ICD-10-CM | POA: Diagnosis not present

## 2020-12-27 DIAGNOSIS — K219 Gastro-esophageal reflux disease without esophagitis: Secondary | ICD-10-CM | POA: Diagnosis not present

## 2020-12-27 DIAGNOSIS — Z681 Body mass index (BMI) 19 or less, adult: Secondary | ICD-10-CM | POA: Diagnosis not present

## 2020-12-27 DIAGNOSIS — G894 Chronic pain syndrome: Secondary | ICD-10-CM | POA: Diagnosis not present

## 2020-12-27 DIAGNOSIS — Z23 Encounter for immunization: Secondary | ICD-10-CM | POA: Diagnosis not present

## 2020-12-30 DIAGNOSIS — J42 Unspecified chronic bronchitis: Secondary | ICD-10-CM | POA: Diagnosis not present

## 2020-12-30 DIAGNOSIS — J439 Emphysema, unspecified: Secondary | ICD-10-CM | POA: Diagnosis not present

## 2020-12-30 DIAGNOSIS — J449 Chronic obstructive pulmonary disease, unspecified: Secondary | ICD-10-CM | POA: Diagnosis not present

## 2021-01-01 DIAGNOSIS — J439 Emphysema, unspecified: Secondary | ICD-10-CM | POA: Diagnosis not present

## 2021-01-01 DIAGNOSIS — J42 Unspecified chronic bronchitis: Secondary | ICD-10-CM | POA: Diagnosis not present

## 2021-01-01 DIAGNOSIS — J449 Chronic obstructive pulmonary disease, unspecified: Secondary | ICD-10-CM | POA: Diagnosis not present

## 2021-01-24 DIAGNOSIS — J449 Chronic obstructive pulmonary disease, unspecified: Secondary | ICD-10-CM | POA: Diagnosis not present

## 2021-01-24 DIAGNOSIS — J22 Unspecified acute lower respiratory infection: Secondary | ICD-10-CM | POA: Diagnosis not present

## 2021-01-24 DIAGNOSIS — M1991 Primary osteoarthritis, unspecified site: Secondary | ICD-10-CM | POA: Diagnosis not present

## 2021-01-24 DIAGNOSIS — G894 Chronic pain syndrome: Secondary | ICD-10-CM | POA: Diagnosis not present

## 2021-01-27 ENCOUNTER — Other Ambulatory Visit: Payer: Self-pay

## 2021-01-27 ENCOUNTER — Emergency Department (HOSPITAL_COMMUNITY): Payer: Medicare Other

## 2021-01-27 ENCOUNTER — Encounter (HOSPITAL_COMMUNITY): Payer: Self-pay

## 2021-01-27 ENCOUNTER — Emergency Department (HOSPITAL_COMMUNITY)
Admission: EM | Admit: 2021-01-27 | Discharge: 2021-01-27 | Disposition: A | Discharge status: Home / Self Care | Payer: Medicare Other | Attending: Emergency Medicine | Admitting: Emergency Medicine

## 2021-01-27 DIAGNOSIS — I2510 Atherosclerotic heart disease of native coronary artery without angina pectoris: Secondary | ICD-10-CM | POA: Insufficient documentation

## 2021-01-27 DIAGNOSIS — I10 Essential (primary) hypertension: Secondary | ICD-10-CM | POA: Diagnosis not present

## 2021-01-27 DIAGNOSIS — U071 COVID-19: Secondary | ICD-10-CM | POA: Insufficient documentation

## 2021-01-27 DIAGNOSIS — Z87891 Personal history of nicotine dependence: Secondary | ICD-10-CM | POA: Insufficient documentation

## 2021-01-27 DIAGNOSIS — Z79899 Other long term (current) drug therapy: Secondary | ICD-10-CM | POA: Insufficient documentation

## 2021-01-27 DIAGNOSIS — R059 Cough, unspecified: Secondary | ICD-10-CM

## 2021-01-27 DIAGNOSIS — Z955 Presence of coronary angioplasty implant and graft: Secondary | ICD-10-CM | POA: Diagnosis not present

## 2021-01-27 DIAGNOSIS — Z7982 Long term (current) use of aspirin: Secondary | ICD-10-CM | POA: Diagnosis not present

## 2021-01-27 DIAGNOSIS — J441 Chronic obstructive pulmonary disease with (acute) exacerbation: Secondary | ICD-10-CM | POA: Insufficient documentation

## 2021-01-27 LAB — CBC WITH DIFFERENTIAL/PLATELET
Abs Immature Granulocytes: 0.01 10*3/uL (ref 0.00–0.07)
Basophils Absolute: 0 10*3/uL (ref 0.0–0.1)
Basophils Relative: 1 %
Eosinophils Absolute: 0 10*3/uL (ref 0.0–0.5)
Eosinophils Relative: 1 %
HCT: 41.9 % (ref 39.0–52.0)
Hemoglobin: 14.5 g/dL (ref 13.0–17.0)
Immature Granulocytes: 0 %
Lymphocytes Relative: 33 %
Lymphs Abs: 1.3 10*3/uL (ref 0.7–4.0)
MCH: 32.4 pg (ref 26.0–34.0)
MCHC: 34.6 g/dL (ref 30.0–36.0)
MCV: 93.7 fL (ref 80.0–100.0)
Monocytes Absolute: 0.8 10*3/uL (ref 0.1–1.0)
Monocytes Relative: 20 %
Neutro Abs: 1.8 10*3/uL (ref 1.7–7.7)
Neutrophils Relative %: 45 %
Platelets: 196 10*3/uL (ref 150–400)
RBC: 4.47 MIL/uL (ref 4.22–5.81)
RDW: 12.3 % (ref 11.5–15.5)
WBC: 4 10*3/uL (ref 4.0–10.5)
nRBC: 0 % (ref 0.0–0.2)

## 2021-01-27 LAB — COMPREHENSIVE METABOLIC PANEL
ALT: 26 U/L (ref 0–44)
AST: 27 U/L (ref 15–41)
Albumin: 4 g/dL (ref 3.5–5.0)
Alkaline Phosphatase: 81 U/L (ref 38–126)
Anion gap: 7 (ref 5–15)
BUN: 14 mg/dL (ref 8–23)
CO2: 26 mmol/L (ref 22–32)
Calcium: 8.2 mg/dL — ABNORMAL LOW (ref 8.9–10.3)
Chloride: 102 mmol/L (ref 98–111)
Creatinine, Ser: 1.05 mg/dL (ref 0.61–1.24)
GFR, Estimated: >60 mL/min (ref >60)
Glucose, Bld: 95 mg/dL (ref 70–99)
Potassium: 3.6 mmol/L (ref 3.5–5.1)
Sodium: 135 mmol/L (ref 135–145)
Total Bilirubin: 0.9 mg/dL (ref 0.3–1.2)
Total Protein: 6.8 g/dL (ref 6.5–8.1)

## 2021-01-27 LAB — RESP PANEL BY RT-PCR (FLU A&B, COVID) ARPGX2
Influenza A by PCR: NEGATIVE
Influenza B by PCR: NEGATIVE
SARS Coronavirus 2 by RT PCR: POSITIVE — AB

## 2021-01-27 LAB — MAGNESIUM: Magnesium: 1.8 mg/dL (ref 1.7–2.4)

## 2021-01-27 MED ORDER — METHYLPREDNISOLONE SODIUM SUCC 125 MG IJ SOLR
125.0000 mg | Freq: Once | INTRAMUSCULAR | Status: AC
  Administered 2021-01-27: 125 mg via INTRAVENOUS
  Filled 2021-01-27: qty 2

## 2021-01-27 MED ORDER — IPRATROPIUM-ALBUTEROL 20-100 MCG/ACT IN AERS
INHALATION_SPRAY | RESPIRATORY_TRACT | Status: AC
  Filled 2021-01-27: qty 4

## 2021-01-27 MED ORDER — DOXYCYCLINE HYCLATE 100 MG PO TABS: 100.0000 mg | ORAL_TABLET | Freq: Once | ORAL | Status: DC

## 2021-01-27 MED ORDER — DOXYCYCLINE HYCLATE 100 MG PO CAPS: 100.0000 mg | ORAL_CAPSULE | Freq: Two times a day (BID) | ORAL | 0 refills | Status: DC

## 2021-01-27 MED ORDER — PREDNISONE 10 MG (21) PO TBPK: ORAL_TABLET | Freq: Every day | ORAL | 0 refills | Status: DC

## 2021-01-27 MED ORDER — IPRATROPIUM-ALBUTEROL 20-100 MCG/ACT IN AERS: 1.0000 | INHALATION_SPRAY | Freq: Four times a day (QID) | RESPIRATORY_TRACT | Status: DC

## 2021-01-27 MED ORDER — CALCIUM CARBONATE ANTACID 500 MG PO CHEW
3.0000 | CHEWABLE_TABLET | Freq: Once | ORAL | Status: AC
  Administered 2021-01-27: 600 mg via ORAL
  Filled 2021-01-27: qty 3

## 2021-01-27 MED ORDER — ALBUTEROL SULFATE HFA 108 (90 BASE) MCG/ACT IN AERS: 2.0000 | INHALATION_SPRAY | Freq: Four times a day (QID) | RESPIRATORY_TRACT | 0 refills | Status: AC | PRN

## 2021-01-27 MED ORDER — IPRATROPIUM-ALBUTEROL 0.5-2.5 (3) MG/3ML IN SOLN: 3.0000 mL | Freq: Once | RESPIRATORY_TRACT | Status: DC

## 2021-01-27 MED ORDER — DM-GUAIFENESIN ER 30-600 MG PO TB12: 1.0000 | ORAL_TABLET | Freq: Two times a day (BID) | ORAL | 0 refills | Status: DC

## 2021-01-27 NOTE — ED Triage Notes (Signed)
Pt arrived to ED with c/o coughing, body aches , head congestion started Monday.

## 2021-01-27 NOTE — ED Notes (Signed)
Date and time results received: 01/27/21 1140   Test:COVID Critical Value: POSITIVE  Name of Provider Notified: EDP  Orders Received? Or Actions Taken?: Notified

## 2021-01-27 NOTE — Discharge Instructions (Addendum)
You have been seen and discharged from the emergency department.  You were treated with a breathing treatment and steroid.  Your COVID swab is positive which is most likely the source of all of your symptoms.  Treat yourself symptomatically as you would with a viral illness.  Take Tylenol/ibuprofen for pain and fever control.  Stay well-hydrated.  Take over-the-counter medications for symptom relief.  Follow-up with your primary provider for reevaluation and further care. Take home medications as prescribed. If you have any worsening symptoms or further concerns for your health please return to an emergency department for further evaluation.

## 2021-01-27 NOTE — ED Provider Notes (Addendum)
[facility] EMERGENCY DEPARTMENT Provider Note   CSN: [id number] Arrival date & time: 01/27/21  0755     History Chief Complaint  Patient presents with   Cough    [name] is a 70 y.o. male.  HPI  70 year old male with past medical history of CAD, COPD, HTN, HLD presents the emergency department with productive cough.  Patient states this started about a week ago.  It is now associated with some congestion, body aches.  Denies any documented fever.  States that the cough is productive of yellow phlegm.  He has been using his home inhaler without significant improvement.  Denies any chest pain, back pain, flank pain, lower extremity swelling.  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   Vitamin D insufficiency 04/01/2016   Elevated C-reactive protein (CRP) 03/28/2016   Chronic pain syndrome 03/26/2016   Chronic bilateral low back pain without sciatica (R>L) 03/26/2016   Long term current use of opiate analgesic 03/26/2016   Long term prescription opiate use 03/26/2016   Encounter for therapeutic drug level monitoring 03/26/2016   Encounter for pain management planning 03/26/2016   Chronic sacroiliac joint pain (B) (R>L) 03/26/2016   Chronic hip pain, left 03/26/2016   Chronic right hip pain 03/26/2016   Chronic hip  pain (B) (L>R) 03/26/2016   Chronic left sacroiliac pain 03/26/2016   Chronic right sacroiliac pain 03/26/2016   PNA (pneumonia) 12/25/2015   Chronic respiratory failure with hypoxia (HCC) 12/25/2015   COPD exacerbation (HCC)    Sepsis secondary to C. difficile colitis 07/03/2015   CAP (community acquired pneumonia) 07/03/2015   Acute on chronic respiratory failure with hypoxia (HCC) 07/03/2015   GERD (gastroesophageal reflux disease) 07/03/2015   Coronary artery disease 10/13/2013   Anxiety 10/13/2013   Dyslipidemia 06/23/2013   COPD (chronic obstructive pulmonary disease) (HCC) 06/23/2013   SHOULDER PAIN 08/16/2008   SHOULDER IMPINGEMENT SYNDROME, LEFT 08/16/2008    Past Surgical History:  Procedure Laterality Date   CARDIAC CATHETERIZATION  08/10/2008   right & left - normal coronaries (Dr. Claudia Desanctis)   FLEXIBLE SIGMOIDOSCOPY  11/13/2020   Procedure: FLEXIBLE SIGMOIDOSCOPY;  Surgeon: Marguerita Merles, Reuel Boom, MD;  Location: [facility];  Service: Gastroenterology;;   NM MYOCAR PERF WALL MOTION  07/2008   bruce myoview - mild ischemia in basal inferoseptal & mid inferoseptal regions; EF 63%   POLYPECTOMY  11/13/2020   Procedure: POLYPECTOMY;  Surgeon: Dolores Frame, MD;  Location: [facility];  Service: Gastroenterology;;   TRANSTHORACIC ECHOCARDIOGRAM  07/2008   EF=>55%; RV mildly dilated; RA mild-mod dilated; mild mitral annular calcification & mild MR; AV mildly sclerotic       Family History  Problem Relation Age of Onset  Leukemia Mother    Suicidality Father     Social History   Tobacco Use   Smoking status: Former    Types: Cigarettes    Start date: 04/21/1974    Quit date: 06/22/2007    Years since quitting: 13.6   Smokeless tobacco: Never  Substance Use Topics   Alcohol use: No    Alcohol/week: 0.0 standard drinks   Drug use: No    Home Medications Prior to Admission medications   Medication Sig Start Date End Date Taking? Authorizing  Provider  albuterol (PROVENTIL HFA;VENTOLIN HFA) 108 (90 BASE) MCG/ACT inhaler Inhale 2 puffs into the lungs every 6 (six) hours as needed for wheezing or shortness of breath.    [provider]  albuterol (PROVENTIL) (2.5 MG/3ML) 0.083% nebulizer solution Take 3 mLs (2.5 mg total) by nebulization every 6 (six) hours as needed for wheezing or shortness of breath. Patient taking differently: Take 2.5 mg by nebulization 3 (three) times daily. 11/29/17   Vassie Loll, MD  amLODipine (NORVASC) 10 MG tablet Take 1 tablet (10 mg total) by mouth daily. 02/27/20   Shon Hale, MD  aspirin EC 81 MG tablet Take 2 tablets (162 mg total) by mouth daily with breakfast. 02/27/20   Emokpae, Courage, MD  cephALEXin (KEFLEX) 500 MG capsule Take 500 mg by mouth 4 (four) times daily.    [provider]  fexofenadine-pseudoephedrine (ALLEGRA-D 24) 180-240 MG 24 hr tablet Take 1 tablet by mouth daily.    [provider]  finasteride (PROSCAR) 5 MG tablet Take 1 tablet (5 mg total) by mouth daily. 12/18/20   McKenzie, Mardene Celeste, MD  furosemide (LASIX) 20 MG tablet Take 1 tablet (20 mg total) by mouth daily. Hold for 1 week Patient taking differently: Take 20 mg by mouth daily. 03/05/20   Shon Hale, MD  guaiFENesin (MUCINEX) 600 MG 12 hr tablet Take 600 mg by mouth 2 (two) times daily as needed (congestion).    [provider]  loratadine (CLARITIN) 10 MG tablet Take 10 mg by mouth daily.    [provider]  Multiple Vitamins-Minerals (CENTRUM ADULTS PO) Take 1 capsule by mouth daily.    [provider]  omeprazole (PRILOSEC) 40 MG capsule Take 40 mg by mouth daily. 01/10/13   [provider]  ondansetron (ZOFRAN) 8 MG tablet Take 8 mg by mouth every 8 (eight) hours as needed for nausea or vomiting. 11/21/17   [provider]  oxyCODONE-acetaminophen (PERCOCET) 10-325 MG tablet Take 1 tablet by mouth 4 (four) times daily as needed for pain.     [provider]  polyethylene glycol-electrolytes (TRILYTE) 420 g solution Take 4,000 mLs by mouth as directed. 11/13/20   Dolores Frame, MD  potassium chloride SA (KLOR-CON) 20 MEQ tablet Take 20 mEq by mouth daily. 08/13/19   [provider]  Propylene Glycol 0.6 % SOLN Place 1 drop into both eyes 2 (two) times daily as needed (dry eyes).    [provider]  sildenafil (VIAGRA) 100 MG tablet Take 1 tablet (100 mg total) by mouth daily as needed for erectile dysfunction. 12/18/20   McKenzie, Mardene Celeste, MD  simvastatin (ZOCOR) 20 MG tablet TAKE 1 TABLET BY MOUTH DAILY AT 6 PM. Patient taking differently: Take 20 mg by mouth daily at 6 PM. 09/21/15   Laqueta Linden, MD  tamsulosin (FLOMAX) 0.4 MG CAPS capsule Take 1 capsule (0.4 mg total) by mouth in the morning and at bedtime. 12/18/20  McKenzie, Mardene Celeste, MD  tiZANidine (ZANAFLEX) 4 MG tablet Take 4 mg by mouth 3 (three) times daily as needed for muscle spasms. 01/03/17   [provider]    Allergies    Ativan [lorazepam] and Trazodone and nefazodone  Review of Systems   Review of Systems  Constitutional:  Positive for fatigue. Negative for chills and fever.  HENT:  Negative for congestion.   Eyes:  Negative for visual disturbance.  Respiratory:  Positive for choking. Negative for shortness of breath.   Cardiovascular:  Negative for chest pain, palpitations and leg swelling.  Gastrointestinal:  Negative for abdominal pain, diarrhea and vomiting.  Genitourinary:  Negative for dysuria.  Musculoskeletal:  Positive for myalgias.  Skin:  Negative for rash.  Neurological:  Negative for headaches.   Physical Exam Updated Vital Signs BP 127/79   Pulse 100   Temp 98.7 F (37.1 C) (Oral)   Resp (!) 25   Ht 5\' 9"  (1.753 m)   Wt 80.3 kg   SpO2 93%   BMI 26.14 kg/m   Physical Exam Vitals and nursing note reviewed.  Constitutional:      Appearance: Normal appearance. He is not  diaphoretic.  HENT:     Head: Normocephalic.     Mouth/Throat:     Mouth: Mucous membranes are moist.  Cardiovascular:     Rate and Rhythm: Normal rate.  Pulmonary:     Effort: Pulmonary effort is normal. No respiratory distress.     Breath sounds: Wheezing present.  Abdominal:     Palpations: Abdomen is soft.     Tenderness: There is no abdominal tenderness.  Musculoskeletal:        General: No swelling.  Skin:    General: Skin is warm.  Neurological:     Mental Status: He is alert and oriented to person, place, and time. Mental status is at baseline.  Psychiatric:        Mood and Affect: Mood normal.    ED Results / Procedures / Treatments   Labs (all labs ordered are listed, but only abnormal results are displayed) Labs Reviewed  RESP PANEL BY RT-PCR (FLU A&B, COVID) ARPGX2  CBC WITH DIFFERENTIAL/PLATELET  COMPREHENSIVE METABOLIC PANEL  MAGNESIUM    EKG None  Radiology No results found.  Procedures Procedures   Medications Ordered in ED Medications  ipratropium-albuterol (DUONEB) 0.5-2.5 (3) MG/3ML nebulizer solution 3 mL (has no administration in time range)  methylPREDNISolone sodium succinate (SOLU-MEDROL) 125 mg/2 mL injection 125 mg (125 mg Intravenous Given 01/27/21 0916)    ED Course  I have reviewed the triage vital signs and the nursing notes.  Pertinent labs & imaging results that were available during my care of the patient were reviewed by me and considered in my medical decision making (see chart for details).    MDM Rules/Calculators/A&P                           70 year old male presents to the emergency department with concern for productive cough and body aches for the past 6 days.  He is afebrile on arrival, normal vitals.  EKG shows frequent PVCs, sometimes in a trigeminy pattern.  He is asymptomatic.  No chest pain, shortness of breath, lightheadedness or syncope.  He has inspiratory and expiratory wheezing on exam.  Blood work is  reassuring, calcium slightly/chronically low. Replacement given orally. Otherwise electrolytes are normal.  Chest x-ray shows no pneumonia.   Patient is  stable with no respiratory distress.  The patient's COVID swab came back positive.  Vitals are stable, he has been having symptoms for 6 days, no indication for specialized treatment. We will treat the patient symptomatically.  Patient at this time appears safe and stable for discharge and will be treated as an outpatient.  Discharge plan and strict return to ED precautions discussed, patient verbalizes understanding and agreement.  Final Clinical Impression(s) / ED Diagnoses Final diagnoses:  Cough    Rx / DC Orders ED Discharge Orders     None        Rozelle Logan, DO 01/27/21 1132    Ewa Hipp, Clabe Seal, DO 01/27/21 1134    Knut Rondinelli, Clabe Seal, DO 01/27/21 1153    Billi Bright, Clabe Seal, DO 01/27/21 1158

## 2021-02-06 DIAGNOSIS — J441 Chronic obstructive pulmonary disease with (acute) exacerbation: Secondary | ICD-10-CM | POA: Diagnosis not present

## 2021-02-06 DIAGNOSIS — I10 Essential (primary) hypertension: Secondary | ICD-10-CM | POA: Diagnosis not present

## 2021-02-06 DIAGNOSIS — U071 COVID-19: Secondary | ICD-10-CM | POA: Diagnosis not present

## 2021-02-25 DIAGNOSIS — M1991 Primary osteoarthritis, unspecified site: Secondary | ICD-10-CM | POA: Diagnosis not present

## 2021-02-25 DIAGNOSIS — I10 Essential (primary) hypertension: Secondary | ICD-10-CM | POA: Diagnosis not present

## 2021-02-25 DIAGNOSIS — J441 Chronic obstructive pulmonary disease with (acute) exacerbation: Secondary | ICD-10-CM | POA: Diagnosis not present

## 2021-02-25 DIAGNOSIS — K219 Gastro-esophageal reflux disease without esophagitis: Secondary | ICD-10-CM | POA: Diagnosis not present

## 2021-02-25 DIAGNOSIS — G894 Chronic pain syndrome: Secondary | ICD-10-CM | POA: Diagnosis not present

## 2021-04-04 DIAGNOSIS — J449 Chronic obstructive pulmonary disease, unspecified: Secondary | ICD-10-CM | POA: Diagnosis not present

## 2021-04-04 DIAGNOSIS — G894 Chronic pain syndrome: Secondary | ICD-10-CM | POA: Diagnosis not present

## 2021-04-04 DIAGNOSIS — I10 Essential (primary) hypertension: Secondary | ICD-10-CM | POA: Diagnosis not present

## 2021-04-04 DIAGNOSIS — M1991 Primary osteoarthritis, unspecified site: Secondary | ICD-10-CM | POA: Diagnosis not present

## 2021-04-06 DIAGNOSIS — J449 Chronic obstructive pulmonary disease, unspecified: Secondary | ICD-10-CM | POA: Diagnosis not present

## 2021-05-03 DIAGNOSIS — Z0001 Encounter for general adult medical examination with abnormal findings: Secondary | ICD-10-CM | POA: Diagnosis not present

## 2021-05-03 DIAGNOSIS — G894 Chronic pain syndrome: Secondary | ICD-10-CM | POA: Diagnosis not present

## 2021-05-03 DIAGNOSIS — J449 Chronic obstructive pulmonary disease, unspecified: Secondary | ICD-10-CM | POA: Diagnosis not present

## 2021-05-03 DIAGNOSIS — K219 Gastro-esophageal reflux disease without esophagitis: Secondary | ICD-10-CM | POA: Diagnosis not present

## 2021-05-03 DIAGNOSIS — E559 Vitamin D deficiency, unspecified: Secondary | ICD-10-CM | POA: Diagnosis not present

## 2021-05-03 DIAGNOSIS — E538 Deficiency of other specified B group vitamins: Secondary | ICD-10-CM | POA: Diagnosis not present

## 2021-05-03 DIAGNOSIS — E7849 Other hyperlipidemia: Secondary | ICD-10-CM | POA: Diagnosis not present

## 2021-05-03 DIAGNOSIS — I10 Essential (primary) hypertension: Secondary | ICD-10-CM | POA: Diagnosis not present

## 2021-05-03 DIAGNOSIS — Z13220 Encounter for screening for lipoid disorders: Secondary | ICD-10-CM | POA: Diagnosis not present

## 2021-05-07 DIAGNOSIS — J449 Chronic obstructive pulmonary disease, unspecified: Secondary | ICD-10-CM | POA: Diagnosis not present

## 2021-06-03 DIAGNOSIS — M1991 Primary osteoarthritis, unspecified site: Secondary | ICD-10-CM | POA: Diagnosis not present

## 2021-06-03 DIAGNOSIS — I10 Essential (primary) hypertension: Secondary | ICD-10-CM | POA: Diagnosis not present

## 2021-06-03 DIAGNOSIS — J449 Chronic obstructive pulmonary disease, unspecified: Secondary | ICD-10-CM | POA: Diagnosis not present

## 2021-06-03 DIAGNOSIS — G894 Chronic pain syndrome: Secondary | ICD-10-CM | POA: Diagnosis not present

## 2021-06-15 ENCOUNTER — Other Ambulatory Visit: Payer: Self-pay

## 2021-06-15 ENCOUNTER — Encounter (HOSPITAL_COMMUNITY): Payer: Self-pay | Admitting: Emergency Medicine

## 2021-06-15 ENCOUNTER — Emergency Department (HOSPITAL_COMMUNITY)
Admission: EM | Admit: 2021-06-15 | Discharge: 2021-06-15 | Disposition: A | Discharge status: Home / Self Care | Payer: Medicare Other | Attending: Emergency Medicine | Admitting: Emergency Medicine

## 2021-06-15 ENCOUNTER — Emergency Department (HOSPITAL_COMMUNITY): Payer: Medicare Other

## 2021-06-15 DIAGNOSIS — J449 Chronic obstructive pulmonary disease, unspecified: Secondary | ICD-10-CM | POA: Insufficient documentation

## 2021-06-15 DIAGNOSIS — S161XXA Strain of muscle, fascia and tendon at neck level, initial encounter: Secondary | ICD-10-CM | POA: Insufficient documentation

## 2021-06-15 DIAGNOSIS — M4312 Spondylolisthesis, cervical region: Secondary | ICD-10-CM | POA: Diagnosis not present

## 2021-06-15 DIAGNOSIS — S199XXA Unspecified injury of neck, initial encounter: Secondary | ICD-10-CM | POA: Diagnosis present

## 2021-06-15 DIAGNOSIS — M542 Cervicalgia: Secondary | ICD-10-CM | POA: Diagnosis not present

## 2021-06-15 DIAGNOSIS — I2510 Atherosclerotic heart disease of native coronary artery without angina pectoris: Secondary | ICD-10-CM | POA: Diagnosis not present

## 2021-06-15 DIAGNOSIS — Y92410 Unspecified street and highway as the place of occurrence of the external cause: Secondary | ICD-10-CM | POA: Diagnosis not present

## 2021-06-15 DIAGNOSIS — R29818 Other symptoms and signs involving the nervous system: Secondary | ICD-10-CM | POA: Diagnosis not present

## 2021-06-15 DIAGNOSIS — R519 Headache, unspecified: Secondary | ICD-10-CM | POA: Diagnosis not present

## 2021-06-15 DIAGNOSIS — Z7982 Long term (current) use of aspirin: Secondary | ICD-10-CM | POA: Insufficient documentation

## 2021-06-15 DIAGNOSIS — M25511 Pain in right shoulder: Secondary | ICD-10-CM | POA: Diagnosis not present

## 2021-06-15 DIAGNOSIS — Z87891 Personal history of nicotine dependence: Secondary | ICD-10-CM | POA: Diagnosis not present

## 2021-06-15 DIAGNOSIS — M25512 Pain in left shoulder: Secondary | ICD-10-CM | POA: Insufficient documentation

## 2021-06-15 DIAGNOSIS — Z79899 Other long term (current) drug therapy: Secondary | ICD-10-CM | POA: Insufficient documentation

## 2021-06-15 NOTE — Discharge Instructions (Signed)
You will be sore in the coming days.  You can take 600 mg ibuprofen every 6 hours as needed for pain control.  Please follow-up with your primary care doctor for further evaluation.  Return to the emergency department for worsening symptoms.
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
## 2021-07-05 DIAGNOSIS — J439 Emphysema, unspecified: Secondary | ICD-10-CM | POA: Diagnosis not present

## 2021-07-05 DIAGNOSIS — M1991 Primary osteoarthritis, unspecified site: Secondary | ICD-10-CM | POA: Diagnosis not present

## 2021-07-05 DIAGNOSIS — G894 Chronic pain syndrome: Secondary | ICD-10-CM | POA: Diagnosis not present

## 2021-07-05 DIAGNOSIS — J42 Unspecified chronic bronchitis: Secondary | ICD-10-CM | POA: Diagnosis not present

## 2021-07-05 DIAGNOSIS — J449 Chronic obstructive pulmonary disease, unspecified: Secondary | ICD-10-CM | POA: Diagnosis not present

## 2021-07-05 DIAGNOSIS — I10 Essential (primary) hypertension: Secondary | ICD-10-CM | POA: Diagnosis not present

## 2021-08-05 DIAGNOSIS — J449 Chronic obstructive pulmonary disease, unspecified: Secondary | ICD-10-CM | POA: Diagnosis not present

## 2021-08-05 DIAGNOSIS — J42 Unspecified chronic bronchitis: Secondary | ICD-10-CM | POA: Diagnosis not present

## 2021-08-05 DIAGNOSIS — J439 Emphysema, unspecified: Secondary | ICD-10-CM | POA: Diagnosis not present

## 2021-08-05 DIAGNOSIS — I10 Essential (primary) hypertension: Secondary | ICD-10-CM | POA: Diagnosis not present

## 2021-08-05 DIAGNOSIS — M1991 Primary osteoarthritis, unspecified site: Secondary | ICD-10-CM | POA: Diagnosis not present

## 2021-08-05 DIAGNOSIS — G894 Chronic pain syndrome: Secondary | ICD-10-CM | POA: Diagnosis not present

## 2021-09-02 DIAGNOSIS — J439 Emphysema, unspecified: Secondary | ICD-10-CM | POA: Diagnosis not present

## 2021-09-02 DIAGNOSIS — J449 Chronic obstructive pulmonary disease, unspecified: Secondary | ICD-10-CM | POA: Diagnosis not present

## 2021-09-02 DIAGNOSIS — J42 Unspecified chronic bronchitis: Secondary | ICD-10-CM | POA: Diagnosis not present

## 2021-09-02 DIAGNOSIS — G894 Chronic pain syndrome: Secondary | ICD-10-CM | POA: Diagnosis not present

## 2021-09-02 DIAGNOSIS — M1991 Primary osteoarthritis, unspecified site: Secondary | ICD-10-CM | POA: Diagnosis not present

## 2021-09-02 DIAGNOSIS — I10 Essential (primary) hypertension: Secondary | ICD-10-CM | POA: Diagnosis not present

## 2021-09-23 ENCOUNTER — Other Ambulatory Visit: Payer: Self-pay | Admitting: Urology

## 2021-10-03 DIAGNOSIS — J439 Emphysema, unspecified: Secondary | ICD-10-CM | POA: Diagnosis not present

## 2021-10-03 DIAGNOSIS — J449 Chronic obstructive pulmonary disease, unspecified: Secondary | ICD-10-CM | POA: Diagnosis not present

## 2021-10-03 DIAGNOSIS — J42 Unspecified chronic bronchitis: Secondary | ICD-10-CM | POA: Diagnosis not present

## 2021-11-18 DIAGNOSIS — J440 Chronic obstructive pulmonary disease with acute lower respiratory infection: Secondary | ICD-10-CM | POA: Diagnosis not present

## 2021-11-18 DIAGNOSIS — I10 Essential (primary) hypertension: Secondary | ICD-10-CM | POA: Diagnosis not present

## 2021-11-18 DIAGNOSIS — K219 Gastro-esophageal reflux disease without esophagitis: Secondary | ICD-10-CM | POA: Diagnosis not present

## 2021-11-29 DIAGNOSIS — G894 Chronic pain syndrome: Secondary | ICD-10-CM | POA: Diagnosis not present

## 2021-11-29 DIAGNOSIS — M1991 Primary osteoarthritis, unspecified site: Secondary | ICD-10-CM | POA: Diagnosis not present

## 2021-11-29 DIAGNOSIS — I700 Atherosclerosis of aorta: Secondary | ICD-10-CM | POA: Diagnosis not present

## 2021-12-18 ENCOUNTER — Encounter: Payer: Self-pay | Admitting: Urology

## 2021-12-18 ENCOUNTER — Ambulatory Visit (INDEPENDENT_AMBULATORY_CARE_PROVIDER_SITE_OTHER): Payer: Medicare Other | Admitting: Urology

## 2021-12-18 VITALS — BP 108/72 | HR 76

## 2021-12-18 DIAGNOSIS — N401 Enlarged prostate with lower urinary tract symptoms: Secondary | ICD-10-CM

## 2021-12-18 DIAGNOSIS — R339 Retention of urine, unspecified: Secondary | ICD-10-CM | POA: Diagnosis not present

## 2021-12-18 DIAGNOSIS — N5201 Erectile dysfunction due to arterial insufficiency: Secondary | ICD-10-CM

## 2021-12-18 LAB — URINALYSIS, ROUTINE W REFLEX MICROSCOPIC
Bilirubin, UA: NEGATIVE
Glucose, UA: NEGATIVE
Ketones, UA: NEGATIVE
Leukocytes,UA: NEGATIVE
Nitrite, UA: NEGATIVE
Protein,UA: NEGATIVE
RBC, UA: NEGATIVE
Specific Gravity, UA: 1.02 (ref 1.005–1.030)
Urobilinogen, Ur: 0.2 mg/dL (ref 0.2–1.0)
pH, UA: 5 (ref 5.0–7.5)

## 2021-12-18 MED ORDER — FINASTERIDE 5 MG PO TABS: 5.0000 mg | ORAL_TABLET | Freq: Every day | ORAL | 3 refills | Status: DC

## 2021-12-18 MED ORDER — VARDENAFIL HCL 20 MG PO TABS: 20.0000 mg | ORAL_TABLET | Freq: Every day | ORAL | 0 refills | Status: AC | PRN

## 2021-12-18 MED ORDER — TAMSULOSIN HCL 0.4 MG PO CAPS: 0.4000 mg | ORAL_CAPSULE | Freq: Two times a day (BID) | ORAL | 3 refills | Status: DC

## 2021-12-18 NOTE — Patient Instructions (Signed)

## 2021-12-18 NOTE — Progress Notes (Unsigned)
12/18/2021 9:26 AM   [name] 1951-01-24 [id number]  Referring provider: Elfredia Nevins, MD [address] [place],  [place] [address]  Followup BPH and erectile dysfunction   HPI: [name] is a 70yo here for followup for BPH and erectile dysfunction. IPSS 7 QOL2 on flomax BID and finasteride.  Nocturia 1-2x. Urine stream strong. No straining to urinate. No urinary hesitancy. No urinary frequency or urgency. He took sildenafil 100mg  prn which failed to give him a firm erection. He gets a semifirm erection.    PMH: Past Medical History:  Diagnosis Date   Anxiety    CAD (coronary artery disease)    Chronic pain    COPD (chronic obstructive pulmonary disease) (HCC)    emphysema   Dyslipidemia    History of tobacco abuse    80 pack year history    Hypertension    On home O2    4L N/C   Opioid dependence (HCC)    Respiratory failure (HCC)     Surgical History: Past Surgical History:  Procedure Laterality Date   CARDIAC CATHETERIZATION  08/10/2008   right & left - normal coronaries (Dr. 08/12/2008)   FLEXIBLE SIGMOIDOSCOPY  11/13/2020   Procedure: FLEXIBLE SIGMOIDOSCOPY;  Surgeon: 11/15/2020, Marguerita Merles, MD;  [facility];  Service: Gastroenterology;;   NM MYOCAR PERF WALL MOTION  07/2008   bruce myoview - mild ischemia in basal inferoseptal & mid inferoseptal regions; EF 63%   POLYPECTOMY  11/13/2020   Procedure: POLYPECTOMY;  Surgeon: 11/15/2020, MD;  Location: [facility];  Service: Gastroenterology;;   TRANSTHORACIC ECHOCARDIOGRAM  07/2008   EF=>55%; RV mildly dilated; RA mild-mod dilated; mild mitral annular calcification & mild Edward; AV mildly sclerotic    Home Medications:  Allergies as of 12/18/2021       Reactions   Ativan [lorazepam] Other (See Comments)   Dry mouth sinus and breathing problems   Trazodone And Nefazodone    Unknown reaction        Medication List        Accurate as of December 18, 2021  9:26 AM.  If you have any questions, ask your nurse or doctor.          albuterol (2.5 MG/3ML) 0.083% nebulizer solution Commonly known as: PROVENTIL Take 3 mLs (2.5 mg total) by nebulization every 6 (six) hours as needed for wheezing or shortness of breath. What changed: when to take this   albuterol 108 (90 Base) MCG/ACT inhaler Commonly known as: VENTOLIN HFA Inhale 2 puffs into the lungs every 6 (six) hours as needed for wheezing or shortness of breath. What changed: Another medication with the same name was changed. Make sure you understand how and when to take each.   amLODipine 10 MG tablet Commonly known as: NORVASC Take 1 tablet (10 mg total) by mouth daily.   aspirin EC 81 MG tablet Take 2 tablets (162 mg total) by mouth daily with breakfast.   CENTRUM ADULTS PO Take 1 capsule by mouth daily.   cephALEXin 500 MG capsule Commonly known as: KEFLEX Take 500 mg by mouth 4 (four) times daily.   dextromethorphan-guaiFENesin 30-600 MG 12hr tablet Commonly known as: MUCINEX DM Take 1 tablet by mouth 2 (two) times daily.   fexofenadine-pseudoephedrine 180-240 MG 24 hr tablet Commonly known as: ALLEGRA-D 24 Take 1 tablet by mouth daily.   finasteride 5 MG tablet Commonly known as: PROSCAR TAKE ONE TABLET BY MOUTH ONCE DAILY.   furosemide  20 MG tablet Commonly known as: LASIX Take 1 tablet (20 mg total) by mouth daily. Hold for 1 week What changed: additional instructions   guaiFENesin 600 MG 12 hr tablet Commonly known as: MUCINEX Take 600 mg by mouth 2 (two) times daily as needed (congestion).   loratadine 10 MG tablet Commonly known as: CLARITIN Take 10 mg by mouth daily.   omeprazole 40 MG capsule Commonly known as: PRILOSEC Take 40 mg by mouth daily.   ondansetron 8 MG tablet Commonly known as: ZOFRAN Take 8 mg by mouth every 8 (eight) hours as needed for nausea or vomiting.   oxyCODONE-acetaminophen 10-325 MG tablet Commonly known as: PERCOCET Take 1 tablet  by mouth 4 (four) times daily as needed for pain.   polyethylene glycol-electrolytes 420 g solution Commonly known as: TriLyte Take 4,000 mLs by mouth as directed.   potassium chloride SA 20 MEQ tablet Commonly known as: KLOR-CON M Take 20 mEq by mouth daily.   Propylene Glycol 0.6 % Soln Place 1 drop into both eyes 2 (two) times daily as needed (dry eyes).   sildenafil 100 MG tablet Commonly known as: VIAGRA Take 1 tablet (100 mg total) by mouth daily as needed for erectile dysfunction.   simvastatin 20 MG tablet Commonly known as: ZOCOR TAKE 1 TABLET BY MOUTH DAILY AT 6 PM. What changed: when to take this   tamsulosin 0.4 MG Caps capsule Commonly known as: FLOMAX Take 1 capsule (0.4 mg total) by mouth in the morning and at bedtime.   tiZANidine 4 MG tablet Commonly known as: ZANAFLEX Take 4 mg by mouth 3 (three) times daily as needed for muscle spasms.        Allergies:  Allergies  Allergen Reactions   Ativan [Lorazepam] Other (See Comments)    Dry mouth sinus and breathing problems    Trazodone And Nefazodone     Unknown reaction    Family History: Family History  Problem Relation Age of Onset   Leukemia Mother    Suicidality Father     Social History:  reports that he quit smoking about 14 years ago. His smoking use included cigarettes. He started smoking about 47 years ago. He has never used smokeless tobacco. He reports that he does not drink alcohol and does not use drugs.  ROS: All other review of systems were reviewed and are negative except what is noted above in HPI  Physical Exam: BP 108/72   Pulse 76   Constitutional:  Alert and oriented, No acute distress. HEENT: [place] AT, moist mucus membranes.  Trachea midline, no masses. Cardiovascular: No clubbing, cyanosis, or edema. Respiratory: Normal respiratory effort, no increased work of breathing. GI: Abdomen is soft, nontender, nondistended, no abdominal masses GU: No CVA tenderness.  Lymph: No  cervical or inguinal lymphadenopathy. Skin: No rashes, bruises or suspicious lesions. Neurologic: Grossly intact, no focal deficits, moving all 4 extremities. Psychiatric: Normal mood and affect.  Laboratory Data: Lab Results  Component Value Date   WBC 4.0 01/27/2021   HGB 14.5 01/27/2021   HCT 41.9 01/27/2021   MCV 93.7 01/27/2021   PLT 196 01/27/2021    Lab Results  Component Value Date   CREATININE 1.05 01/27/2021    Lab Results  Component Value Date   PSA 0.9 11/07/2019    No results found for: "TESTOSTERONE"  No results found for: "HGBA1C"  Urinalysis    Component Value Date/Time   COLORURINE YELLOW 02/25/2020 1531   APPEARANCEUR CLEAR 02/25/2020 1531  LABSPEC >1.046 (H) 02/25/2020 1531   PHURINE 5.0 02/25/2020 1531   GLUCOSEU NEGATIVE 02/25/2020 1531   HGBUR NEGATIVE 02/25/2020 1531   BILIRUBINUR NEGATIVE 02/25/2020 1531   KETONESUR 20 (A) 02/25/2020 1531   PROTEINUR NEGATIVE 02/25/2020 1531   NITRITE NEGATIVE 02/25/2020 1531   LEUKOCYTESUR NEGATIVE 02/25/2020 1531    Lab Results  Component Value Date   BACTERIA RARE (A) 06/30/2017    Pertinent Imaging:  No results found for this or any previous visit.  No results found for this or any previous visit.  No results found for this or any previous visit.  No results found for this or any previous visit.  No results found for this or any previous visit.  No results found for this or any previous visit.  No results found for this or any previous visit.  No results found for this or any previous visit.   Assessment & Plan:    1. Benign localized prostatic hyperplasia with lower urinary tract symptoms (LUTS) -Continue flomax BID and finasteride - Urinalysis, Routine w reflex microscopic  2. Urinary retention Continue flomax BID and finasteride  3. Erectile dysfunction due to arterial insufficiency -We will trial levitra 20mg  prn   No follow-ups on file.  , MD  [facility] [place]

## 2021-12-19 DIAGNOSIS — I10 Essential (primary) hypertension: Secondary | ICD-10-CM | POA: Diagnosis not present

## 2021-12-19 DIAGNOSIS — J449 Chronic obstructive pulmonary disease, unspecified: Secondary | ICD-10-CM | POA: Diagnosis not present

## 2021-12-19 DIAGNOSIS — K219 Gastro-esophageal reflux disease without esophagitis: Secondary | ICD-10-CM | POA: Diagnosis not present

## 2021-12-19 LAB — PSA: Prostate Specific Ag, Serum: 1 ng/mL (ref 0.0–4.0)

## 2021-12-25 ENCOUNTER — Telehealth: Payer: Self-pay

## 2021-12-25 NOTE — Telephone Encounter (Signed)
-----   Message from Malen Gauze, MD sent at 12/24/2021  9:49 AM EDT ----- normal ----- Message ----- From: Troy Sine, CMA Sent: 12/19/2021  11:56 AM EDT To: Malen Gauze, MD  Please Review

## 2021-12-25 NOTE — Telephone Encounter (Signed)
Made patient aware that his PSA was normal and patient voiced understanding. 

## 2021-12-26 ENCOUNTER — Telehealth: Payer: Self-pay | Admitting: *Deleted

## 2021-12-26 DIAGNOSIS — H25813 Combined forms of age-related cataract, bilateral: Secondary | ICD-10-CM | POA: Diagnosis not present

## 2021-12-26 NOTE — Patient Outreach (Signed)
  Care Coordination   12/26/2021 Name: [name] MRN: [id number] DOB: 04/03/1951   Care Coordination Outreach Attempts:  An unsuccessful telephone outreach was attempted today to offer the patient information about available care coordination services as a benefit of their health plan.   Follow Up Plan:  Additional outreach attempts will be made to offer the patient care coordination information and services.   Encounter Outcome:  No Answer  Care Coordination Interventions Activated:  No   Care Coordination Interventions:  No, not indicated    Demetrios Loll, BSN, RN-BC RN Care Coordinator [facility] Dial: [phone]

## 2021-12-31 ENCOUNTER — Encounter: Payer: Self-pay | Admitting: *Deleted

## 2021-12-31 ENCOUNTER — Telehealth: Payer: Self-pay | Admitting: *Deleted

## 2021-12-31 DIAGNOSIS — G894 Chronic pain syndrome: Secondary | ICD-10-CM | POA: Diagnosis not present

## 2021-12-31 NOTE — Patient Outreach (Signed)
  Care Coordination   Initial Visit Note   12/31/2021 Name: BAWI LAKINS MRN: [id number] DOB: 1951-03-16  Fabio Neighbors is a 71 y.o. year old male who sees Elfredia Nevins, MD for primary care. I spoke with  Fabio Neighbors by phone today.  What matters to the patients health and wellness today?  Ongoing self management of chronic medical conditions     Goals Addressed             This Visit's Progress    COMPLETED: Care Coordination Services (No follow-up needed)       Care Coordination Interventions: Reviewed medications with patient and discussed affordability Assessed social determinant of health barriers Assessed mobility and ability to perform ADLs Assessed family/Social support Provided patient/caregiver with verbal information on [facility] Care Coordination Services [phone]) Encouraged patient to request a referral for [facility] Care Coordination Services from PCP if services are needed in the future          SDOH assessments and interventions completed:  Yes  SDOH Interventions Today    Flowsheet Row Most Recent Value  SDOH Interventions   Transportation Interventions Intervention Not Indicated  Financial Strain Interventions Intervention Not Indicated        Care Coordination Interventions Activated:  Yes  Care Coordination Interventions:  Yes, provided   Follow up plan: No further intervention required.   Encounter Outcome:  Pt. Visit Completed   Demetrios Loll, BSN, RN-BC RN Care Coordinator [facility]  [facility] Direct Dial: [phone] Main #: [phone]

## 2022-01-08 DIAGNOSIS — H01001 Unspecified blepharitis right upper eyelid: Secondary | ICD-10-CM | POA: Diagnosis not present

## 2022-01-08 DIAGNOSIS — H11041 Peripheral pterygium, stationary, right eye: Secondary | ICD-10-CM | POA: Diagnosis not present

## 2022-01-08 DIAGNOSIS — H2513 Age-related nuclear cataract, bilateral: Secondary | ICD-10-CM | POA: Diagnosis not present

## 2022-01-08 DIAGNOSIS — H01004 Unspecified blepharitis left upper eyelid: Secondary | ICD-10-CM | POA: Diagnosis not present

## 2022-01-08 DIAGNOSIS — H43821 Vitreomacular adhesion, right eye: Secondary | ICD-10-CM | POA: Diagnosis not present

## 2022-01-22 NOTE — H&P (Signed)
Surgical History & Physical  Patient Name: [name]  DOB: Sep 04, 1950  Surgery: Cataract extraction with intraocular lens implant phacoemulsification; Left Eye Surgeon: Ronn Melena MD Surgery Date: 01/31/2022 Pre-Op Date: 01/08/2022  HPI: A 67 Yr. old male patient present for cataract eval per Dr. Hassell Done. 1. 1. The patient complains of difficulty when driving due to glare from headlights or sun, which began 8 months ago. The left eye is affected. The episode is constant. The condition's severity is worsening. Symptoms occur when the patient is driving. Patient states he has stopped driving at night due to this. This is negatively affecting the patient's quality of life and the patient is unable to function adequately in life with the current level of vision. HPI was performed by Ronn Melena .  Medical History: Cataracts   General Health  Enlarged prostate, Acid Reflux High Blood Pressure LDL Lung Problems  Review of Systems Negative Allergic/Immunologic Negative Cardiovascular Negative Constitutional Negative Ear, Nose, Mouth & Throat Negative Endocrine Negative Eyes Negative Gastrointestinal Negative Genitourinary Negative Hemotologic/Lymphatic Negative Integumentary Negative Musculoskeletal Negative Neurological Negative Psychiatry Negative Respiratory  Social Former smoker of Cigarettes   Medication Cholesterol pill, Potassium chloride, Flomax, Omeprazole, Oxycodone, Lasix  Sx/Procedures None  Drug Allergies  Trazadone  History & Physical: Heent: cataracts NECK: supple without bruits LUNGS: lungs clear to auscultation CV: regular rate and rhythm Abdomen: soft and non-tender  Impression & Plan: Assessment: 1.  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  CATARACT AGE-RELATED NUCLEAR; Both Eyes (H25.13) 4.  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  VITREOMACULAR  TRACTION VMT; Right Eye [phone])  Plan: 1.  Has preferred to wear only reading glasses. Will target best distance with cataract surgery, discussed possibility of glasses to optimize distance.  2.  Warm compresses once daily and lid scrubs once weekly.  3.  Cataracts are visually significant and account for the patient's complaints. Discussed all risks, benefits, procedures and recovery, including infection, loss of vision and eye, need for glasses after surgery or additional procedures. Patient understands changing glasses will not improve vision. Patient indicated understanding of procedure. All questions answered. Patient desires to have surgery, recommend phacoemulsification with intraocular lens. Patient to have preliminary testing necessary (Argos/IOL Master, Mac OCT, TOPO) Educational materials provided.  Plan - Target: -0.25 OU with OS first and OD to follow - RayOne lenses - On Flomax, mod dilation, may need ring. Will plan for Omidria - Deep set orbits - Ok to lie flat - no prior eye surgeries  4.  Stable, monitor. Not bothering patient.  5.  Very mild VMT OD, captured on OCT today. Monitor. No treatment needed.

## 2022-01-24 DIAGNOSIS — H2512 Age-related nuclear cataract, left eye: Secondary | ICD-10-CM | POA: Diagnosis not present

## 2022-01-27 ENCOUNTER — Telehealth: Payer: Self-pay | Admitting: *Deleted

## 2022-01-27 ENCOUNTER — Encounter (HOSPITAL_COMMUNITY)
Admission: RE | Admit: 2022-01-27 | Discharge: 2022-01-27 | Disposition: A | Discharge status: Home / Self Care | Payer: Medicare Other | Source: Ambulatory Visit | Attending: Optometry | Admitting: Optometry

## 2022-01-27 NOTE — Patient Outreach (Signed)
  Care Coordination   01/27/2022 Name: [name] MRN: [id number] DOB: Jul 16, 1950   Care Coordination Outreach Attempts:  A third unsuccessful outreach was attempted today to offer the patient with information about available care coordination services as a benefit of their health plan.   Follow Up Plan:  No further outreach attempts will be made at this time. We have been unable to contact the patient to offer or enroll patient in care coordination services  Encounter Outcome:  No Answer  Care Coordination Interventions Activated:  No   Care Coordination Interventions:  No, not indicated    Valente David, RN, MSN, [facility] Care Management Care Management Coordinator [phone]

## 2022-01-30 DIAGNOSIS — M1991 Primary osteoarthritis, unspecified site: Secondary | ICD-10-CM | POA: Diagnosis not present

## 2022-01-30 DIAGNOSIS — J440 Chronic obstructive pulmonary disease with acute lower respiratory infection: Secondary | ICD-10-CM | POA: Diagnosis not present

## 2022-01-30 DIAGNOSIS — G894 Chronic pain syndrome: Secondary | ICD-10-CM | POA: Diagnosis not present

## 2022-01-30 DIAGNOSIS — Z23 Encounter for immunization: Secondary | ICD-10-CM | POA: Diagnosis not present

## 2022-01-31 ENCOUNTER — Ambulatory Visit (HOSPITAL_BASED_OUTPATIENT_CLINIC_OR_DEPARTMENT_OTHER): Payer: Medicare Other | Admitting: Anesthesiology

## 2022-01-31 ENCOUNTER — Encounter (HOSPITAL_COMMUNITY)
Admission: RE | Disposition: A | Discharge status: Home / Self Care | Payer: Self-pay | Source: Home / Self Care | Attending: Optometry

## 2022-01-31 ENCOUNTER — Ambulatory Visit (HOSPITAL_COMMUNITY)
Admission: RE | Admit: 2022-01-31 | Discharge: 2022-01-31 | Disposition: A | Discharge status: Home / Self Care | Payer: Medicare Other | Attending: Optometry | Admitting: Optometry

## 2022-01-31 ENCOUNTER — Encounter (HOSPITAL_COMMUNITY): Payer: Self-pay | Admitting: Optometry

## 2022-01-31 ENCOUNTER — Ambulatory Visit (HOSPITAL_COMMUNITY): Payer: Medicare Other | Admitting: Anesthesiology

## 2022-01-31 DIAGNOSIS — J449 Chronic obstructive pulmonary disease, unspecified: Secondary | ICD-10-CM | POA: Diagnosis not present

## 2022-01-31 DIAGNOSIS — G894 Chronic pain syndrome: Secondary | ICD-10-CM | POA: Diagnosis not present

## 2022-01-31 DIAGNOSIS — Z79891 Long term (current) use of opiate analgesic: Secondary | ICD-10-CM | POA: Diagnosis not present

## 2022-01-31 DIAGNOSIS — I2510 Atherosclerotic heart disease of native coronary artery without angina pectoris: Secondary | ICD-10-CM | POA: Diagnosis not present

## 2022-01-31 DIAGNOSIS — Z87891 Personal history of nicotine dependence: Secondary | ICD-10-CM

## 2022-01-31 DIAGNOSIS — K219 Gastro-esophageal reflux disease without esophagitis: Secondary | ICD-10-CM | POA: Insufficient documentation

## 2022-01-31 DIAGNOSIS — F419 Anxiety disorder, unspecified: Secondary | ICD-10-CM | POA: Insufficient documentation

## 2022-01-31 DIAGNOSIS — I10 Essential (primary) hypertension: Secondary | ICD-10-CM

## 2022-01-31 DIAGNOSIS — H25812 Combined forms of age-related cataract, left eye: Secondary | ICD-10-CM

## 2022-01-31 DIAGNOSIS — H2512 Age-related nuclear cataract, left eye: Secondary | ICD-10-CM | POA: Insufficient documentation

## 2022-01-31 HISTORY — PX: CATARACT EXTRACTION W/PHACO: SHX586

## 2022-01-31 SURGERY — PHACOEMULSIFICATION, CATARACT, WITH IOL INSERTION
Anesthesia: Monitor Anesthesia Care | Site: Eye | Laterality: Left

## 2022-01-31 MED ORDER — PHENYLEPHRINE HCL 2.5 % OP SOLN
1.0000 [drp] | OPHTHALMIC | Status: AC | PRN
  Administered 2022-01-31 (×3): 1 [drp] via OPHTHALMIC

## 2022-01-31 MED ORDER — BSS IO SOLN
INTRAOCULAR | Status: DC | PRN
  Administered 2022-01-31: 15 mL via INTRAOCULAR

## 2022-01-31 MED ORDER — TETRACAINE HCL 0.5 % OP SOLN
1.0000 [drp] | OPHTHALMIC | Status: AC | PRN
  Administered 2022-01-31 (×3): 1 [drp] via OPHTHALMIC

## 2022-01-31 MED ORDER — SIGHTPATH DOSE#1 NA HYALUR & NA CHOND-NA HYALUR IO KIT
PACK | INTRAOCULAR | Status: DC | PRN
  Administered 2022-01-31: 1 via OPHTHALMIC

## 2022-01-31 MED ORDER — POVIDONE-IODINE 5 % OP SOLN
OPHTHALMIC | Status: DC | PRN
  Administered 2022-01-31: 1 via OPHTHALMIC

## 2022-01-31 MED ORDER — PHENYLEPHRINE-KETOROLAC 1-0.3 % IO SOLN
INTRAOCULAR | Status: AC
  Filled 2022-01-31: qty 4

## 2022-01-31 MED ORDER — EPINEPHRINE PF 1 MG/ML IJ SOLN
INTRAMUSCULAR | Status: AC
  Filled 2022-01-31: qty 1

## 2022-01-31 MED ORDER — FENTANYL CITRATE (PF) 100 MCG/2ML IJ SOLN
INTRAMUSCULAR | Status: DC | PRN
  Administered 2022-01-31: 50 ug via INTRAVENOUS

## 2022-01-31 MED ORDER — SODIUM CHLORIDE 0.9% FLUSH
INTRAVENOUS | Status: DC | PRN
  Administered 2022-01-31: 3 mL via INTRAVENOUS

## 2022-01-31 MED ORDER — SEVOFLURANE IN SOLN
RESPIRATORY_TRACT | Status: AC
  Filled 2022-01-31: qty 250

## 2022-01-31 MED ORDER — TROPICAMIDE 1 % OP SOLN
1.0000 [drp] | OPHTHALMIC | Status: AC | PRN
  Administered 2022-01-31 (×3): 1 [drp] via OPHTHALMIC
  Filled 2022-01-31: qty 3

## 2022-01-31 MED ORDER — STERILE WATER FOR IRRIGATION IR SOLN
Status: DC | PRN
  Administered 2022-01-31: 50 mL

## 2022-01-31 MED ORDER — PHENYLEPHRINE-KETOROLAC 1-0.3 % IO SOLN
INTRAOCULAR | Status: DC | PRN
  Administered 2022-01-31: 500 mL via OPHTHALMIC

## 2022-01-31 MED ORDER — MIDAZOLAM HCL 5 MG/5ML IJ SOLN
INTRAMUSCULAR | Status: DC | PRN
  Administered 2022-01-31: 1 mg via INTRAVENOUS

## 2022-01-31 MED ORDER — FENTANYL CITRATE (PF) 100 MCG/2ML IJ SOLN
INTRAMUSCULAR | Status: AC
  Filled 2022-01-31: qty 2

## 2022-01-31 MED ORDER — MIDAZOLAM HCL 2 MG/2ML IJ SOLN
INTRAMUSCULAR | Status: AC
  Filled 2022-01-31: qty 2

## 2022-01-31 MED ORDER — LIDOCAINE HCL 3.5 % OP GEL
1.0000 | Freq: Once | OPHTHALMIC | Status: AC
  Administered 2022-01-31: 1 via OPHTHALMIC

## 2022-01-31 MED ORDER — LIDOCAINE HCL (PF) 1 % IJ SOLN
INTRAMUSCULAR | Status: DC | PRN
  Administered 2022-01-31: 1 mL

## 2022-01-31 MED ORDER — MOXIFLOXACIN HCL 0.5 % OP SOLN
OPHTHALMIC | Status: DC | PRN
  Administered 2022-01-31: 2 [drp] via OPHTHALMIC

## 2022-01-31 SURGICAL SUPPLY — 16 items
CATARACT SUITE SIGHTPATH (MISCELLANEOUS) ×1 IMPLANT
CLOTH BEACON ORANGE TIMEOUT ST (SAFETY) ×1 IMPLANT
DRAPE HALF SHEET 40X57 (DRAPES) IMPLANT
DRSG TEGADERM 4X4.75 (GAUZE/BANDAGES/DRESSINGS) ×1 IMPLANT
EYE SHIELD UNIVERSAL CLEAR (GAUZE/BANDAGES/DRESSINGS) IMPLANT
FEE CATARACT SUITE SIGHTPATH (MISCELLANEOUS) ×1 IMPLANT
GLOVE BIOGEL PI IND STRL 7.0 (GLOVE) ×2 IMPLANT
GLOVE SURG SS PI 7.0 STRL IVOR (GLOVE) IMPLANT
LENS IOL RAYNER 20.5 (Intraocular Lens) ×1 IMPLANT
LENS IOL RAYONE EMV 20.5 (Intraocular Lens) IMPLANT
NDL HYPO 18GX1.5 BLUNT FILL (NEEDLE) ×1 IMPLANT
NEEDLE HYPO 18GX1.5 BLUNT FILL (NEEDLE) ×1 IMPLANT
PAD ARMBOARD 7.5X6 YLW CONV (MISCELLANEOUS) ×1 IMPLANT
SYR TB 1ML LL NO SAFETY (SYRINGE) ×1 IMPLANT
TAPE SURG TRANSPORE 1 IN (GAUZE/BANDAGES/DRESSINGS) IMPLANT
TAPE SURGICAL TRANSPORE 1 IN (GAUZE/BANDAGES/DRESSINGS) ×1

## 2022-01-31 NOTE — Interval H&P Note (Signed)
The H and P was reviewed and updated. The patient was examined.  No changes were found after exam.  The surgical eye was marked.    [name]

## 2022-01-31 NOTE — Anesthesia Postprocedure Evaluation (Signed)
Anesthesia Post Note  Patient: [name]  Procedure(s) Performed: CATARACT EXTRACTION PHACO AND INTRAOCULAR LENS PLACEMENT (IOC) (Left: Eye)  Patient location during evaluation: PACU Anesthesia Type: MAC Level of consciousness: awake and alert and oriented Pain management: pain level controlled Vital Signs Assessment: post-procedure vital signs reviewed and stable Respiratory status: spontaneous breathing, nonlabored ventilation and respiratory function stable Cardiovascular status: blood pressure returned to baseline and stable Postop Assessment: no apparent nausea or vomiting Anesthetic complications: no   No notable events documented.   Last Vitals:  Vitals:   01/31/22 0754 01/31/22 0853  BP: (!) 136/93 (!) 139/109  Pulse: 84 86  Resp: 16 16  Temp: 36.6 C 36.7 C  SpO2: 97% 99%    Last Pain:  Vitals:   01/31/22 0853  TempSrc: Oral  PainSc: 0-No pain                 [name] C Yancy Hascall

## 2022-01-31 NOTE — Transfer of Care (Signed)
Immediate Anesthesia Transfer of Care Note  Patient: [name]  Procedure(s) Performed: CATARACT EXTRACTION PHACO AND INTRAOCULAR LENS PLACEMENT (IOC) (Left: Eye)  Patient Location: Short Stay  Anesthesia Type:MAC  Level of Consciousness: awake, alert  and oriented  Airway & Oxygen Therapy: Patient Spontanous Breathing  Post-op Assessment: Report given to RN and Post -op Vital signs reviewed and stable  Post vital signs: Reviewed and stable  Last Vitals:  Vitals Value Taken Time  BP 127/82   Temp 98   Pulse 62   Resp 16   SpO2 92     Last Pain:  Vitals:   01/31/22 0754  TempSrc: Oral  PainSc: 0-No pain      Patients Stated Pain Goal: 9 (33/54/56 2563)  Complications: No notable events documented.

## 2022-01-31 NOTE — Op Note (Signed)
Date of procedure: 01/31/22  Pre-operative diagnosis: Visually significant age-related nuclear cataract, Left Eye (H25.12)  Post-operative diagnosis: Visually significant age-related nuclear cataract, Left Eye  Procedure: Removal of cataract via phacoemulsification and insertion of intra-ocular lens Rayner [id number] +20.5D into the capsular bag of the Left Eye  Attending surgeon: Ronn Melena, MD  Anesthesia: MAC, Topical Akten  Complications: None  Estimated Blood Loss: <49m (minimal)  Specimens: None  Implants:  Implant Name Type Inv. Item Serial No. Manufacturer Lot No. LRB No. Used Action  LENS IOL RAYNER 20.5 - S75 Intraocular Lens LENS IOL RAYNER 20.5 75 [employer] [id number] Implanted    Indications:  Visually significant age-related cataract, Left Eye  Procedure:  The patient was seen and identified in the pre-operative area. The operative eye was identified and dilated.  The operative eye was marked.  Topical anesthesia was administered to the operative eye.     The patient was then to the operative suite and placed in the supine position.  A timeout was performed confirming the patient, procedure to be performed, and all other relevant information.   The patient's face was prepped and draped in the usual fashion for intra-ocular surgery.  A lid speculum was placed into the operative eye and the surgical microscope moved into place and focused.  An inferotemporal paracentesis was created using a 20 gauge paracentesis blade.  BSS mixed with Omidria, followed by 1% lidocaine was injected into the anterior chamber.  Viscoelastic was injected into the anterior chamber.  A temporal clear-corneal main wound incision was created using a 2.435mmicrokeratome.  A continuous curvilinear capsulorrhexis was initiated using an irrigating cystitome and completed using capsulorrhexis forceps.  Hydrodissection and hydrodeliniation were performed.  Viscoelastic was injected into the  anterior chamber.  A phacoemulsification handpiece and a chopper as a second instrument were used to remove the nucleus and epinucleus. The irrigation/aspiration handpiece was used to remove any remaining cortical material.   The capsular bag was reinflated with viscoelastic, checked, and found to be intact.  The intraocular lens was inserted into the capsular bag.  The irrigation/aspiration handpiece was used to remove any remaining viscoelastic.  The clear corneal wound and paracentesis wounds were then hydrated and checked with Weck-Cels to be watertight.  The lid-speculum and drape was removed, and the patient's face was cleaned with a wet and dry 4x4.  Moxifloxacin was instilled onto the eye. A clear shield was taped over the eye. The patient was taken to the post-operative care unit in good condition, having tolerated the procedure well.  Post-Op Instructions: [facility] and will receive all other orders and instructions.

## 2022-01-31 NOTE — Anesthesia Preprocedure Evaluation (Signed)
Anesthesia Evaluation  Patient identified by MRN, date of birth, ID band Patient awake    Reviewed: Allergy & Precautions, NPO status , Patient's Chart, lab work & pertinent test results  Airway Mallampati: II  TM Distance: >3 FB Neck ROM: Full    Dental  (+) Dental Advisory Given, Edentulous Upper, Missing   Pulmonary pneumonia, resolved, COPD,  COPD inhaler, former smoker (used to smoke 2 packs/day),    Pulmonary exam normal breath sounds clear to auscultation       Cardiovascular Exercise Tolerance: Good hypertension, Pt. on medications + CAD  Normal cardiovascular exam Rhythm:Regular Rate:Normal     Neuro/Psych PSYCHIATRIC DISORDERS Anxiety negative neurological ROS     GI/Hepatic Neg liver ROS, GERD  Medicated and Controlled,  Endo/Other  negative endocrine ROS  Renal/GU negative Renal ROS  negative genitourinary   Musculoskeletal negative musculoskeletal ROS (+)   Abdominal   Peds negative pediatric ROS (+)  Hematology negative hematology ROS (+)   Anesthesia Other Findings Acute on chronic respiratory failure (HCC) Acute on chronic respiratory failure with hypoxia (HCC) Anxiety Benign prostatic hyperplasia with urinary obstruction C. difficile colitis CAP (community acquired pneumonia) COPD (chronic obstructive pulmonary disease) (HCC) COPD exacerbation (HCC) Chronic bilateral low back pain without sciatica (R>L) Chronic hip pain (B) (L>R) Chronic hip pain, left Chronic left sacroiliac pain Chronic pain syndrome Chronic respiratory failure with hypoxia (HCC) Chronic right hip pain Chronic right sacroiliac pain Chronic sacroiliac joint pain (B) (R>L) Coronary artery disease Cough with hemoptysis Dyslipidemia Elevated C-reactive protein (CRP) Encounter for pain management planning Encounter for therapeutic drug level monitoring Erectile dysfunction due to arterial insufficiency GERD  (gastroesophageal reflux disease) Long term current use of opiate analgesic Long term prescription opiate use PNA (pneumonia) SHOULDER IMPINGEMENT SYNDROME, LEFT SHOULDER PAIN Sepsis secondary to C. difficile colitis Tracheal mass Urinary retention Vitamin D insufficiency    Reproductive/Obstetrics negative OB ROS                            Anesthesia Physical Anesthesia Plan  ASA: 3  Anesthesia Plan: MAC   Post-op Pain Management: Minimal or no pain anticipated   Induction:   PONV Risk Score and Plan: Treatment may vary due to age or medical condition  Airway Management Planned: Natural Airway and Nasal Cannula  Additional Equipment:   Intra-op Plan:   Post-operative Plan:   Informed Consent: I have reviewed the patients History and Physical, chart, labs and discussed the procedure including the risks, benefits and alternatives for the proposed anesthesia with the patient or authorized representative who has indicated his/her understanding and acceptance.     Dental advisory given  Plan Discussed with: CRNA and Surgeon  Anesthesia Plan Comments:         Anesthesia Quick Evaluation

## 2022-01-31 NOTE — Discharge Instructions (Signed)
Please discharge patient when stable, will follow up today with Dr. Aurora Rody at the [facility] office immediately following discharge.  Leave shield in place until visit.  All paperwork with discharge instructions will be given at the office.  [facility] [place] Address:  [address]  [place], [place] [address]  

## 2022-02-04 ENCOUNTER — Encounter (HOSPITAL_COMMUNITY): Payer: Self-pay | Admitting: Optometry

## 2022-02-05 NOTE — H&P (Signed)
Surgical History & Physical  Patient Name: [name]  DOB: 1950-09-05  Surgery: Cataract extraction with intraocular lens implant phacoemulsification; Right Eye Surgeon: Ronn Melena MD Surgery Date: 02/14/2022 Pre-Op Date: 02/04/2022  HPI: A 4 Yr. old male patient present for 4 day post op OS. Patient states he is doing well without any problems. Using Combo drop TID OS. Patient is still having glare problems at night and during the day when driving due to the cataract OD. This is negatively affecting the patient's quality of life and the patient is unable to function adequately in life with the current level of vision. He would like to schedule sx OD.  Medical History: Cataract  Enlarged prostate, Acid Reflux High Blood Pressure LDL Lung Problems  Review of Systems Negative Allergic/Immunologic Negative Cardiovascular Negative Constitutional Negative Ear, Nose, Mouth & Throat Negative Endocrine Negative Eyes Negative Gastrointestinal Negative Genitourinary Negative Hemotologic/Lymphatic Negative Integumentary Negative Musculoskeletal Negative Neurological Negative Psychiatry Negative Respiratory  Social Former smoker of Cigarettes   Medication Prednisolone-Moxifloxacin-Bromfenac,  Cholesterol pill, Potassium chloride, Flomax, Omeprazole, Oxycodone, Lasix   Sx/Procedures CE/IOL OS   Drug Allergies  Trazadone   History & Physical: Heent: cataract OD NECK: supple without bruits LUNGS: lungs clear to auscultation CV: regular rate and rhythm Abdomen: soft and non-tender  Impression & Plan: Assessment: 1.  Pseudophakia- post-op; Left Eye (Z98.42) 2.  Myopia ; Left Eye (H52.12) 3.  CATARACT AGE-RELATED NUCLEAR; Right Eye (H25.11)  Plan: 1.  POD#4. Continue with eye drops as directed. Reviewed all post-op precautions. Call with increased redness, swelling, pain or loss of vision. Avoid swimming pools and hot tubs for 1 week.  2.   3.  Cataract is  visually significant and account for the patient's complaints. Discussed all risks, benefits, procedures and recovery, including infection, loss of vision and eye, need for glasses after surgery or additional procedures. Patient understands changing glasses will not improve vision. Patient indicated understanding of procedure. All questions answered. Patient desires to have surgery, recommend phacoemulsification with intraocular lens. Patient to have preliminary testing necessary (Argos/IOL Master, Mac OCT, TOPO) Educational materials provided.  Plan - Target: -0.25 OU, ready to proceed with OD - RayOne lenses - On Flomax, mod dilation, may need ring. Will plan for Omidria - Deep set orbits - Ok to lie flat - no prior eye surgeries

## 2022-02-07 DIAGNOSIS — H25811 Combined forms of age-related cataract, right eye: Secondary | ICD-10-CM | POA: Diagnosis not present

## 2022-02-10 ENCOUNTER — Encounter (HOSPITAL_COMMUNITY)
Admission: RE | Admit: 2022-02-10 | Discharge: 2022-02-10 | Disposition: A | Discharge status: Home / Self Care | Payer: Medicare Other | Source: Ambulatory Visit | Attending: Optometry | Admitting: Optometry

## 2022-02-12 ENCOUNTER — Encounter (HOSPITAL_COMMUNITY): Payer: Self-pay

## 2022-02-14 ENCOUNTER — Ambulatory Visit (HOSPITAL_COMMUNITY): Payer: Medicare Other | Admitting: Certified Registered Nurse Anesthetist

## 2022-02-14 ENCOUNTER — Encounter (HOSPITAL_COMMUNITY)
Admission: RE | Disposition: A | Discharge status: Home / Self Care | Payer: Self-pay | Source: Home / Self Care | Attending: Optometry

## 2022-02-14 ENCOUNTER — Ambulatory Visit (HOSPITAL_BASED_OUTPATIENT_CLINIC_OR_DEPARTMENT_OTHER): Payer: Medicare Other | Admitting: Certified Registered Nurse Anesthetist

## 2022-02-14 ENCOUNTER — Encounter (HOSPITAL_COMMUNITY): Payer: Self-pay | Admitting: Optometry

## 2022-02-14 ENCOUNTER — Other Ambulatory Visit: Payer: Self-pay

## 2022-02-14 ENCOUNTER — Ambulatory Visit (HOSPITAL_COMMUNITY)
Admission: RE | Admit: 2022-02-14 | Discharge: 2022-02-14 | Disposition: A | Discharge status: Home / Self Care | Payer: Medicare Other | Attending: Optometry | Admitting: Optometry

## 2022-02-14 DIAGNOSIS — Z87891 Personal history of nicotine dependence: Secondary | ICD-10-CM | POA: Insufficient documentation

## 2022-02-14 DIAGNOSIS — J441 Chronic obstructive pulmonary disease with (acute) exacerbation: Secondary | ICD-10-CM

## 2022-02-14 DIAGNOSIS — I2510 Atherosclerotic heart disease of native coronary artery without angina pectoris: Secondary | ICD-10-CM | POA: Insufficient documentation

## 2022-02-14 DIAGNOSIS — J449 Chronic obstructive pulmonary disease, unspecified: Secondary | ICD-10-CM | POA: Diagnosis not present

## 2022-02-14 DIAGNOSIS — H25811 Combined forms of age-related cataract, right eye: Secondary | ICD-10-CM | POA: Diagnosis not present

## 2022-02-14 DIAGNOSIS — H5212 Myopia, left eye: Secondary | ICD-10-CM | POA: Diagnosis not present

## 2022-02-14 DIAGNOSIS — Z9981 Dependence on supplemental oxygen: Secondary | ICD-10-CM | POA: Diagnosis not present

## 2022-02-14 DIAGNOSIS — J9620 Acute and chronic respiratory failure, unspecified whether with hypoxia or hypercapnia: Secondary | ICD-10-CM | POA: Diagnosis not present

## 2022-02-14 DIAGNOSIS — H2511 Age-related nuclear cataract, right eye: Secondary | ICD-10-CM

## 2022-02-14 DIAGNOSIS — I10 Essential (primary) hypertension: Secondary | ICD-10-CM | POA: Diagnosis not present

## 2022-02-14 DIAGNOSIS — K219 Gastro-esophageal reflux disease without esophagitis: Secondary | ICD-10-CM | POA: Insufficient documentation

## 2022-02-14 HISTORY — PX: CATARACT EXTRACTION W/PHACO: SHX586

## 2022-02-14 SURGERY — PHACOEMULSIFICATION, CATARACT, WITH IOL INSERTION
Anesthesia: Monitor Anesthesia Care | Site: Eye | Laterality: Right

## 2022-02-14 MED ORDER — POVIDONE-IODINE 5 % OP SOLN
OPHTHALMIC | Status: DC | PRN
  Administered 2022-02-14: 1 via OPHTHALMIC

## 2022-02-14 MED ORDER — SIGHTPATH DOSE#1 NA HYALUR & NA CHOND-NA HYALUR IO KIT
PACK | INTRAOCULAR | Status: DC | PRN
  Administered 2022-02-14: 1 via OPHTHALMIC

## 2022-02-14 MED ORDER — FENTANYL CITRATE (PF) 100 MCG/2ML IJ SOLN
INTRAMUSCULAR | Status: AC
  Filled 2022-02-14: qty 2

## 2022-02-14 MED ORDER — MOXIFLOXACIN HCL 0.5 % OP SOLN
OPHTHALMIC | Status: DC | PRN
  Administered 2022-02-14: 1 mL via OPHTHALMIC

## 2022-02-14 MED ORDER — LIDOCAINE HCL 3.5 % OP GEL
1.0000 | Freq: Once | OPHTHALMIC | Status: AC
  Administered 2022-02-14: 1 via OPHTHALMIC

## 2022-02-14 MED ORDER — PHENYLEPHRINE-KETOROLAC 1-0.3 % IO SOLN
INTRAOCULAR | Status: AC
  Filled 2022-02-14: qty 4

## 2022-02-14 MED ORDER — LIDOCAINE HCL (PF) 1 % IJ SOLN
INTRAMUSCULAR | Status: DC | PRN
  Administered 2022-02-14: 1 mL

## 2022-02-14 MED ORDER — STERILE WATER FOR IRRIGATION IR SOLN
Status: DC | PRN
  Administered 2022-02-14: 250 mL

## 2022-02-14 MED ORDER — MIDAZOLAM HCL 2 MG/2ML IJ SOLN
INTRAMUSCULAR | Status: DC | PRN
  Administered 2022-02-14: 1 mg via INTRAVENOUS

## 2022-02-14 MED ORDER — TETRACAINE HCL 0.5 % OP SOLN
1.0000 [drp] | OPHTHALMIC | Status: AC
  Administered 2022-02-14 (×3): 1 [drp] via OPHTHALMIC

## 2022-02-14 MED ORDER — PHENYLEPHRINE-KETOROLAC 1-0.3 % IO SOLN
INTRAOCULAR | Status: DC | PRN
  Administered 2022-02-14: 500 mL via OPHTHALMIC

## 2022-02-14 MED ORDER — MIDAZOLAM HCL 2 MG/2ML IJ SOLN
INTRAMUSCULAR | Status: AC
  Filled 2022-02-14: qty 2

## 2022-02-14 MED ORDER — PHENYLEPHRINE HCL 2.5 % OP SOLN
1.0000 [drp] | OPHTHALMIC | Status: AC
  Administered 2022-02-14 (×3): 1 [drp] via OPHTHALMIC

## 2022-02-14 MED ORDER — TROPICAMIDE 1 % OP SOLN
1.0000 [drp] | OPHTHALMIC | Status: AC
  Administered 2022-02-14 (×3): 1 [drp] via OPHTHALMIC
  Filled 2022-02-14: qty 3

## 2022-02-14 MED ORDER — FENTANYL CITRATE (PF) 100 MCG/2ML IJ SOLN
INTRAMUSCULAR | Status: DC | PRN
  Administered 2022-02-14: 50 ug via INTRAVENOUS

## 2022-02-14 MED ORDER — MOXIFLOXACIN HCL 5 MG/ML IO SOLN
INTRAOCULAR | Status: AC
  Filled 2022-02-14: qty 1

## 2022-02-14 MED ORDER — BSS IO SOLN
INTRAOCULAR | Status: DC | PRN
  Administered 2022-02-14: 15 mL via INTRAOCULAR

## 2022-02-14 SURGICAL SUPPLY — 15 items
CATARACT SUITE SIGHTPATH (MISCELLANEOUS) ×1 IMPLANT
CLOTH BEACON ORANGE TIMEOUT ST (SAFETY) ×1 IMPLANT
DRSG TEGADERM 4X4.75 (GAUZE/BANDAGES/DRESSINGS) ×1 IMPLANT
EYE SHIELD UNIVERSAL CLEAR (GAUZE/BANDAGES/DRESSINGS) IMPLANT
FEE CATARACT SUITE SIGHTPATH (MISCELLANEOUS) ×1 IMPLANT
GLOVE BIOGEL PI IND STRL 7.0 (GLOVE) ×2 IMPLANT
LENS IOL RAYNER 19.5 (Intraocular Lens) ×1 IMPLANT
LENS IOL RAYONE EMV 19.5 (Intraocular Lens) IMPLANT
NDL HYPO 18GX1.5 BLUNT FILL (NEEDLE) ×1 IMPLANT
NEEDLE HYPO 18GX1.5 BLUNT FILL (NEEDLE) ×1 IMPLANT
PAD ARMBOARD 7.5X6 YLW CONV (MISCELLANEOUS) ×1 IMPLANT
SYR TB 1ML LL NO SAFETY (SYRINGE) ×1 IMPLANT
TAPE SURG TRANSPORE 1 IN (GAUZE/BANDAGES/DRESSINGS) IMPLANT
TAPE SURGICAL TRANSPORE 1 IN (GAUZE/BANDAGES/DRESSINGS) ×1
WATER STERILE IRR 250ML POUR (IV SOLUTION) ×1 IMPLANT

## 2022-02-14 NOTE — Op Note (Signed)
Date of procedure: 02/14/22  Pre-operative diagnosis: Visually significant age-related nuclear cataract, Right Eye (H25.11)  Post-operative diagnosis: Visually significant age-related nuclear cataract, Right Eye  Procedure: Removal of cataract via phacoemulsification and insertion of intra-ocular lens Rayner [id number] +19.5D into the capsular bag of the Right Eye  Attending surgeon: Ronn Melena, MD  Anesthesia: MAC, Topical Akten  Complications: None  Estimated Blood Loss: <61m (minimal)  Specimens: None  Implants:  Implant Name Type Inv. Item Serial No. Manufacturer Lot No. LRB No. Used Action  LENS IOL RAYNER 19.5 - [id number] Lens LENS IOL RAYNER 19.5  SIGHTPATH [id number] 1 Implanted    Indications:  Visually significant age-related cataract, Right Eye  Procedure:  The patient was seen and identified in the pre-operative area. The operative eye was identified and dilated.  The operative eye was marked.  Topical anesthesia was administered to the operative eye.     The patient was then to the operative suite and placed in the supine position.  A timeout was performed confirming the patient, procedure to be performed, and all other relevant information.   The patient's face was prepped and draped in the usual fashion for intra-ocular surgery.  A lid speculum was placed into the operative eye and the surgical microscope moved into place and focused.  A superotemporal paracentesis was created using a 20 gauge paracentesis blade.  BSS mixed with Omidria, followed by 1% lidocaine was injected into the anterior chamber.  Viscoelastic was injected into the anterior chamber.  A temporal clear-corneal main wound incision was created using a 2.434mmicrokeratome.  A continuous curvilinear capsulorrhexis was initiated using an irrigating cystitome and completed using capsulorrhexis forceps.  Hydrodissection and hydrodeliniation were performed.  Viscoelastic was injected into  the anterior chamber.  A phacoemulsification handpiece and a chopper as a second instrument were used to remove the nucleus and epinucleus. The irrigation/aspiration handpiece was used to remove any remaining cortical material.   The capsular bag was reinflated with viscoelastic, checked, and found to be intact.  The intraocular lens was inserted into the capsular bag.  The irrigation/aspiration handpiece was used to remove any remaining viscoelastic.  The clear corneal wound and paracentesis wounds were then hydrated and checked with Weck-Cels to be watertight.  The lid-speculum and drape was removed, and the patient's face was cleaned with a wet and dry 4x4.  Moxifloxacin drops were instilled onto the eye. A clear shield was taped over the eye. The patient was taken to the post-operative care unit in good condition, having tolerated the procedure well.  Post-Op Instructions: [facility] and will receive all other orders and instructions.

## 2022-02-14 NOTE — Anesthesia Postprocedure Evaluation (Signed)
Anesthesia Post Note  Patient: LUCAN RINER  Procedure(s) Performed: CATARACT EXTRACTION PHACO AND INTRAOCULAR LENS PLACEMENT (IOC) (Right: Eye)  Patient location during evaluation: Phase II Anesthesia Type: MAC Level of consciousness: awake Pain management: pain level controlled Vital Signs Assessment: post-procedure vital signs reviewed and stable Respiratory status: spontaneous breathing and respiratory function stable Cardiovascular status: blood pressure returned to baseline and stable Postop Assessment: no headache and no apparent nausea or vomiting Anesthetic complications: no Comments: Late entry   No notable events documented.   Last Vitals:  Vitals:   02/14/22 0704 02/14/22 0816  BP: (!) 143/81 (!) 141/89  Pulse: 79 72  Resp: 12 12  Temp: 36.8 C 36.6 C  SpO2: 95% 98%    Last Pain:  Vitals:   02/14/22 0816  TempSrc: Oral  PainSc: 0-No pain                 Louann Sjogren

## 2022-02-14 NOTE — Anesthesia Preprocedure Evaluation (Signed)
Anesthesia Evaluation  Patient identified by MRN, date of birth, ID band Patient awake    Reviewed: Allergy & Precautions, H&P , NPO status , Patient's Chart, lab work & pertinent test results, reviewed documented beta blocker date and time   Airway Mallampati: II  TM Distance: >3 FB Neck ROM: full    Dental no notable dental hx. (+) Dental Advisory Given, Edentulous Upper, Missing   Pulmonary pneumonia, resolved, COPD,  COPD inhaler and oxygen dependent, former smoker,    Pulmonary exam normal breath sounds clear to auscultation       Cardiovascular Exercise Tolerance: Good hypertension, Pt. on medications + CAD  Normal cardiovascular exam Rhythm:regular Rate:Normal     Neuro/Psych PSYCHIATRIC DISORDERS Anxiety negative neurological ROS     GI/Hepatic Neg liver ROS, GERD  Medicated,  Endo/Other  negative endocrine ROS  Renal/GU negative Renal ROS  negative genitourinary   Musculoskeletal negative musculoskeletal ROS (+)   Abdominal   Peds negative pediatric ROS (+)  Hematology negative hematology ROS (+)   Anesthesia Other Findings Acute on chronic respiratory failure (HCC) Acute on chronic respiratory failure with hypoxia (HCC) Anxiety Benign prostatic hyperplasia with urinary obstruction C. difficile colitis CAP (community acquired pneumonia) COPD (chronic obstructive pulmonary disease) (HCC) COPD exacerbation (HCC) Chronic bilateral low back pain without sciatica (R>L) Chronic hip pain (B) (L>R) Chronic hip pain, left Chronic left sacroiliac pain Chronic pain syndrome Chronic respiratory failure with hypoxia (HCC) Chronic right hip pain Chronic right sacroiliac pain Chronic sacroiliac joint pain (B) (R>L) Coronary artery disease Cough with hemoptysis Dyslipidemia Elevated C-reactive protein (CRP) Encounter for pain management planning Encounter for therapeutic drug level monitoring Erectile  dysfunction due to arterial insufficiency GERD (gastroesophageal reflux disease) Long term current use of opiate analgesic Long term prescription opiate use PNA (pneumonia) SHOULDER IMPINGEMENT SYNDROME, LEFT SHOULDER PAIN Sepsis secondary to C. difficile colitis Tracheal mass Urinary retention Vitamin D insufficiency    Reproductive/Obstetrics negative OB ROS                             Anesthesia Physical  Anesthesia Plan  ASA: 3  Anesthesia Plan: MAC   Post-op Pain Management: Minimal or no pain anticipated   Induction:   PONV Risk Score and Plan: Treatment may vary due to age or medical condition  Airway Management Planned: Natural Airway and Nasal Cannula  Additional Equipment:   Intra-op Plan:   Post-operative Plan:   Informed Consent: I have reviewed the patients History and Physical, chart, labs and discussed the procedure including the risks, benefits and alternatives for the proposed anesthesia with the patient or authorized representative who has indicated his/her understanding and acceptance.     Dental Advisory Given  Plan Discussed with: CRNA  Anesthesia Plan Comments:         Anesthesia Quick Evaluation

## 2022-02-14 NOTE — Discharge Instructions (Signed)
Please discharge patient when stable, will follow up today with Dr. Aisha Greenberger at the [facility] [place] office immediately following discharge.  Leave shield in place until visit.  All paperwork with discharge instructions will be given at the office.  [facility] Address:  [address]  , [place] [address]  

## 2022-02-14 NOTE — Interval H&P Note (Signed)
History and Physical Interval Note:  02/14/2022 7:10 AM  The H and P was reviewed and updated. The patient was examined.  No changes were found after exam.  The surgical eye was marked.    [name]

## 2022-02-14 NOTE — Transfer of Care (Signed)
Immediate Anesthesia Transfer of Care Note  Patient: [name]  Procedure(s) Performed: CATARACT EXTRACTION PHACO AND INTRAOCULAR LENS PLACEMENT (IOC) (Right: Eye)  Patient Location: Short Stay  Anesthesia Type:MAC  Level of Consciousness: awake, alert  and oriented  Airway & Oxygen Therapy: Patient Spontanous Breathing  Post-op Assessment: Report given to RN and Post -op Vital signs reviewed and stable  Post vital signs: Reviewed and stable  Last Vitals:  Vitals Value Taken Time  BP 141/89 02/14/22 0816  Temp 36.6 C 02/14/22 0816  Pulse 72 02/14/22 0816  Resp 12 02/14/22 0816  SpO2 98 % 02/14/22 0816    Last Pain:  Vitals:   02/14/22 0816  TempSrc: Oral  PainSc: 0-No pain         Complications: No notable events documented.

## 2022-02-18 ENCOUNTER — Encounter (HOSPITAL_COMMUNITY): Payer: Self-pay | Admitting: Optometry

## 2022-03-03 DIAGNOSIS — M1991 Primary osteoarthritis, unspecified site: Secondary | ICD-10-CM | POA: Diagnosis not present

## 2022-03-03 DIAGNOSIS — J440 Chronic obstructive pulmonary disease with acute lower respiratory infection: Secondary | ICD-10-CM | POA: Diagnosis not present

## 2022-03-03 DIAGNOSIS — I10 Essential (primary) hypertension: Secondary | ICD-10-CM | POA: Diagnosis not present

## 2022-03-20 DIAGNOSIS — K219 Gastro-esophageal reflux disease without esophagitis: Secondary | ICD-10-CM | POA: Diagnosis not present

## 2022-03-20 DIAGNOSIS — J449 Chronic obstructive pulmonary disease, unspecified: Secondary | ICD-10-CM | POA: Diagnosis not present

## 2022-03-20 DIAGNOSIS — I10 Essential (primary) hypertension: Secondary | ICD-10-CM | POA: Diagnosis not present

## 2022-04-02 DIAGNOSIS — M1991 Primary osteoarthritis, unspecified site: Secondary | ICD-10-CM | POA: Diagnosis not present

## 2022-04-02 DIAGNOSIS — I10 Essential (primary) hypertension: Secondary | ICD-10-CM | POA: Diagnosis not present

## 2022-04-02 DIAGNOSIS — J440 Chronic obstructive pulmonary disease with acute lower respiratory infection: Secondary | ICD-10-CM | POA: Diagnosis not present

## 2022-05-05 DIAGNOSIS — Z0001 Encounter for general adult medical examination with abnormal findings: Secondary | ICD-10-CM | POA: Diagnosis not present

## 2022-05-05 DIAGNOSIS — E039 Hypothyroidism, unspecified: Secondary | ICD-10-CM | POA: Diagnosis not present

## 2022-05-05 DIAGNOSIS — I700 Atherosclerosis of aorta: Secondary | ICD-10-CM | POA: Diagnosis not present

## 2022-05-05 DIAGNOSIS — J449 Chronic obstructive pulmonary disease, unspecified: Secondary | ICD-10-CM | POA: Diagnosis not present

## 2022-05-05 DIAGNOSIS — E559 Vitamin D deficiency, unspecified: Secondary | ICD-10-CM | POA: Diagnosis not present

## 2022-05-05 DIAGNOSIS — I10 Essential (primary) hypertension: Secondary | ICD-10-CM | POA: Diagnosis not present

## 2022-05-05 DIAGNOSIS — D518 Other vitamin B12 deficiency anemias: Secondary | ICD-10-CM | POA: Diagnosis not present

## 2022-06-05 DIAGNOSIS — I700 Atherosclerosis of aorta: Secondary | ICD-10-CM | POA: Diagnosis not present

## 2022-06-05 DIAGNOSIS — G894 Chronic pain syndrome: Secondary | ICD-10-CM | POA: Diagnosis not present

## 2022-06-05 DIAGNOSIS — J449 Chronic obstructive pulmonary disease, unspecified: Secondary | ICD-10-CM | POA: Diagnosis not present

## 2022-06-05 DIAGNOSIS — I10 Essential (primary) hypertension: Secondary | ICD-10-CM | POA: Diagnosis not present

## 2022-07-24 DIAGNOSIS — I700 Atherosclerosis of aorta: Secondary | ICD-10-CM | POA: Diagnosis not present

## 2022-07-24 DIAGNOSIS — J440 Chronic obstructive pulmonary disease with acute lower respiratory infection: Secondary | ICD-10-CM | POA: Diagnosis not present

## 2022-07-24 DIAGNOSIS — E7489 Other specified disorders of carbohydrate metabolism: Secondary | ICD-10-CM | POA: Diagnosis not present

## 2022-07-24 DIAGNOSIS — D518 Other vitamin B12 deficiency anemias: Secondary | ICD-10-CM | POA: Diagnosis not present

## 2022-08-04 DIAGNOSIS — G894 Chronic pain syndrome: Secondary | ICD-10-CM | POA: Diagnosis not present

## 2022-08-26 DIAGNOSIS — I700 Atherosclerosis of aorta: Secondary | ICD-10-CM | POA: Diagnosis not present

## 2022-08-26 DIAGNOSIS — E7489 Other specified disorders of carbohydrate metabolism: Secondary | ICD-10-CM | POA: Diagnosis not present

## 2022-08-26 DIAGNOSIS — G894 Chronic pain syndrome: Secondary | ICD-10-CM | POA: Diagnosis not present

## 2022-08-26 DIAGNOSIS — I10 Essential (primary) hypertension: Secondary | ICD-10-CM | POA: Diagnosis not present

## 2022-08-26 DIAGNOSIS — J440 Chronic obstructive pulmonary disease with acute lower respiratory infection: Secondary | ICD-10-CM | POA: Diagnosis not present

## 2022-08-26 DIAGNOSIS — J42 Unspecified chronic bronchitis: Secondary | ICD-10-CM | POA: Diagnosis not present

## 2022-09-09 DIAGNOSIS — J449 Chronic obstructive pulmonary disease, unspecified: Secondary | ICD-10-CM | POA: Diagnosis not present

## 2022-09-09 DIAGNOSIS — J42 Unspecified chronic bronchitis: Secondary | ICD-10-CM | POA: Diagnosis not present

## 2022-10-06 DIAGNOSIS — J449 Chronic obstructive pulmonary disease, unspecified: Secondary | ICD-10-CM | POA: Diagnosis not present

## 2022-10-06 DIAGNOSIS — J439 Emphysema, unspecified: Secondary | ICD-10-CM | POA: Diagnosis not present

## 2022-10-06 DIAGNOSIS — J42 Unspecified chronic bronchitis: Secondary | ICD-10-CM | POA: Diagnosis not present

## 2022-10-30 DIAGNOSIS — G894 Chronic pain syndrome: Secondary | ICD-10-CM | POA: Diagnosis not present

## 2022-11-27 DIAGNOSIS — G894 Chronic pain syndrome: Secondary | ICD-10-CM | POA: Diagnosis not present

## 2022-12-13 ENCOUNTER — Emergency Department (HOSPITAL_COMMUNITY): Payer: 59

## 2022-12-13 ENCOUNTER — Other Ambulatory Visit: Payer: Self-pay

## 2022-12-13 ENCOUNTER — Emergency Department (HOSPITAL_COMMUNITY)
Admission: EM | Admit: 2022-12-13 | Discharge: 2022-12-13 | Disposition: A | Discharge status: Home / Self Care | Payer: 59 | Attending: Emergency Medicine | Admitting: Emergency Medicine

## 2022-12-13 ENCOUNTER — Encounter (HOSPITAL_COMMUNITY): Payer: Self-pay

## 2022-12-13 DIAGNOSIS — Z7982 Long term (current) use of aspirin: Secondary | ICD-10-CM | POA: Insufficient documentation

## 2022-12-13 DIAGNOSIS — I2510 Atherosclerotic heart disease of native coronary artery without angina pectoris: Secondary | ICD-10-CM | POA: Insufficient documentation

## 2022-12-13 DIAGNOSIS — M25559 Pain in unspecified hip: Secondary | ICD-10-CM | POA: Diagnosis not present

## 2022-12-13 DIAGNOSIS — I700 Atherosclerosis of aorta: Secondary | ICD-10-CM | POA: Diagnosis not present

## 2022-12-13 DIAGNOSIS — J449 Chronic obstructive pulmonary disease, unspecified: Secondary | ICD-10-CM | POA: Insufficient documentation

## 2022-12-13 DIAGNOSIS — M545 Low back pain, unspecified: Secondary | ICD-10-CM | POA: Insufficient documentation

## 2022-12-13 DIAGNOSIS — M47816 Spondylosis without myelopathy or radiculopathy, lumbar region: Secondary | ICD-10-CM | POA: Diagnosis not present

## 2022-12-13 LAB — URINALYSIS, ROUTINE W REFLEX MICROSCOPIC
Bilirubin Urine: NEGATIVE
Glucose, UA: NEGATIVE mg/dL
Hgb urine dipstick: NEGATIVE
Ketones, ur: NEGATIVE mg/dL
Leukocytes,Ua: NEGATIVE
Nitrite: NEGATIVE
Protein, ur: NEGATIVE mg/dL
Specific Gravity, Urine: 1.024 (ref 1.005–1.030)
pH: 5 (ref 5.0–8.0)

## 2022-12-13 LAB — COMPREHENSIVE METABOLIC PANEL
ALT: 17 U/L (ref 0–44)
AST: 18 U/L (ref 15–41)
Albumin: 4.1 g/dL (ref 3.5–5.0)
Alkaline Phosphatase: 96 U/L (ref 38–126)
Anion gap: 9 (ref 5–15)
BUN: 14 mg/dL (ref 8–23)
CO2: 25 mmol/L (ref 22–32)
Calcium: 9.2 mg/dL (ref 8.9–10.3)
Chloride: 105 mmol/L (ref 98–111)
Creatinine, Ser: 1.39 mg/dL — ABNORMAL HIGH (ref 0.61–1.24)
GFR, Estimated: 54 mL/min — ABNORMAL LOW (ref >60)
Glucose, Bld: 107 mg/dL — ABNORMAL HIGH (ref 70–99)
Potassium: 4.8 mmol/L (ref 3.5–5.1)
Sodium: 139 mmol/L (ref 135–145)
Total Bilirubin: 0.9 mg/dL (ref 0.3–1.2)
Total Protein: 7 g/dL (ref 6.5–8.1)

## 2022-12-13 LAB — CBC WITH DIFFERENTIAL/PLATELET
Abs Immature Granulocytes: 0.06 10*3/uL (ref 0.00–0.07)
Basophils Absolute: 0 10*3/uL (ref 0.0–0.1)
Basophils Relative: 0 %
Eosinophils Absolute: 0.1 10*3/uL (ref 0.0–0.5)
Eosinophils Relative: 1 %
HCT: 42 % (ref 39.0–52.0)
Hemoglobin: 14.1 g/dL (ref 13.0–17.0)
Immature Granulocytes: 1 %
Lymphocytes Relative: 12 %
Lymphs Abs: 1.1 10*3/uL (ref 0.7–4.0)
MCH: 31.3 pg (ref 26.0–34.0)
MCHC: 33.6 g/dL (ref 30.0–36.0)
MCV: 93.1 fL (ref 80.0–100.0)
Monocytes Absolute: 0.9 10*3/uL (ref 0.1–1.0)
Monocytes Relative: 9 %
Neutro Abs: 7.5 10*3/uL (ref 1.7–7.7)
Neutrophils Relative %: 77 %
Platelets: 274 10*3/uL (ref 150–400)
RBC: 4.51 MIL/uL (ref 4.22–5.81)
RDW: 12.4 % (ref 11.5–15.5)
WBC: 9.7 10*3/uL (ref 4.0–10.5)
nRBC: 0 % (ref 0.0–0.2)

## 2022-12-13 MED ORDER — IBUPROFEN 600 MG PO TABS: 600.0000 mg | ORAL_TABLET | Freq: Four times a day (QID) | ORAL | 0 refills | Status: AC | PRN

## 2022-12-13 NOTE — ED Triage Notes (Signed)
POV from home/ pt pushed mowed yard yesterday and woke up this am with lower back pain/ pt also c/o unable to urinate fully/ pt A&Ox4/ ambulatory

## 2022-12-13 NOTE — Discharge Instructions (Signed)
Are seen in the emergency room today for low back pain and urinary retention.   I would recommend following up with the urologist for increased urinary symptoms. You have no sign of a urine infection today in the ER.   I would recommend taking Ibuprofen with foot for the back pain. If you have breakthrough I would recommend taking the oxycodone.  You can alternate ice and heat as needed for your back pain as well.  Please follow-up with your primary care doctor to assure resolution of symptoms.   Please return to the ER if symptoms worsen or continue.

## 2022-12-13 NOTE — ED Provider Notes (Signed)
[place] EMERGENCY DEPARTMENT AT [facility] Provider Note   CSN: [id number] Arrival date & time: 12/13/22  1455     History  Chief Complaint  Patient presents with   Back Pain    [name] is a 72 y.o. male w/ pmhx of BPH, CAD, COPD, opioid dependence, and low back pain presenting with low back pain that started this morning after opperating push mower yesterday. He woke up with 7/10 pain in the center of his low back, pain does not radiate to either side. He also reports that he is having difficulty with urination - hesitency and dribbling. Pt reports able to ambulate into room and no change in gait. Denies incontinence, not reporting saddle anesthesia, no weakness, or change in sensation. Reports he took Oxycodone with some relief of pain. No trauma, or recent falls.   Back Pain      Home Medications Prior to Admission medications   Medication Sig Start Date End Date Taking? Authorizing Provider  albuterol (PROVENTIL) (2.5 MG/3ML) 0.083% nebulizer solution Take 3 mLs (2.5 mg total) by nebulization every 6 (six) hours as needed for wheezing or shortness of breath. Patient taking differently: Take 2.5 mg by nebulization 3 (three) times daily. 11/29/17   Vassie Loll, MD  albuterol (VENTOLIN HFA) 108 (90 Base) MCG/ACT inhaler Inhale 2 puffs into the lungs every 6 (six) hours as needed for wheezing or shortness of breath. 01/27/21   Horton, Clabe Seal, DO  amLODipine (NORVASC) 10 MG tablet Take 1 tablet (10 mg total) by mouth daily. 02/27/20   Shon Hale, MD  aspirin EC 81 MG tablet Take 2 tablets (162 mg total) by mouth daily with breakfast. 02/27/20   Emokpae, Courage, MD  cephALEXin (KEFLEX) 500 MG capsule Take 500 mg by mouth 4 (four) times daily.    [provider]  dextromethorphan-guaiFENesin (MUCINEX DM) 30-600 MG 12hr tablet Take 1 tablet by mouth 2 (two) times daily. 01/27/21   Horton, Kristie M, DO  fexofenadine-pseudoephedrine (ALLEGRA-D 24)  180-240 MG 24 hr tablet Take 1 tablet by mouth daily.    [provider]  finasteride (PROSCAR) 5 MG tablet Take 1 tablet (5 mg total) by mouth daily. 12/18/21   McKenzie, Mardene Celeste, MD  furosemide (LASIX) 20 MG tablet Take 1 tablet (20 mg total) by mouth daily. Hold for 1 week Patient taking differently: Take 20 mg by mouth daily. 03/05/20   Shon Hale, MD  guaiFENesin (MUCINEX) 600 MG 12 hr tablet Take 600 mg by mouth 2 (two) times daily as needed (congestion).    [provider]  loratadine (CLARITIN) 10 MG tablet Take 10 mg by mouth daily.    [provider]  Multiple Vitamins-Minerals (CENTRUM ADULTS PO) Take 1 capsule by mouth daily.    [provider]  omeprazole (PRILOSEC) 40 MG capsule Take 40 mg by mouth daily. 01/10/13   [provider]  ondansetron (ZOFRAN) 8 MG tablet Take 8 mg by mouth every 8 (eight) hours as needed for nausea or vomiting. 11/21/17   [provider]  oxyCODONE-acetaminophen (PERCOCET) 10-325 MG tablet Take 1 tablet by mouth 4 (four) times daily as needed for pain.    [provider]  polyethylene glycol-electrolytes (TRILYTE) 420 g solution Take 4,000 mLs by mouth as directed. 11/13/20   Dolores Frame, MD  potassium chloride SA (KLOR-CON) 20 MEQ tablet Take 20 mEq by mouth daily. 08/13/19   [provider]  Propylene Glycol 0.6 % SOLN Place 1  drop into both eyes 2 (two) times daily as needed (dry eyes).    [provider]  sildenafil (VIAGRA) 100 MG tablet Take 1 tablet (100 mg total) by mouth daily as needed for erectile dysfunction. 12/18/20   McKenzie, Mardene Celeste, MD  simvastatin (ZOCOR) 20 MG tablet TAKE 1 TABLET BY MOUTH DAILY AT 6 PM. Patient taking differently: Take 20 mg by mouth daily at 6 PM. 09/21/15   Laqueta Linden, MD  tamsulosin (FLOMAX) 0.4 MG CAPS capsule Take 1 capsule (0.4 mg total) by mouth in the morning and at bedtime. 12/18/21   McKenzie, Mardene Celeste, MD   tiZANidine (ZANAFLEX) 4 MG tablet Take 4 mg by mouth 3 (three) times daily as needed for muscle spasms. 01/03/17   [provider]  vardenafil (LEVITRA) 20 MG tablet Take 1 tablet (20 mg total) by mouth daily as needed for erectile dysfunction. 12/18/21   McKenzie, Mardene Celeste, MD      Allergies    Ativan [lorazepam] and Trazodone and nefazodone    Review of Systems   Review of Systems  Genitourinary:  Positive for difficulty urinating.  Musculoskeletal:  Positive for back pain.    Physical Exam Updated Vital Signs BP (!) 158/88 (BP Location: Right Arm)   Pulse (!) 58   Temp 98.4 F (36.9 C)   Resp 18   Ht 5\' 9"  (1.753 m)   Wt 82.6 kg   SpO2 99%   BMI 26.88 kg/m  Physical Exam Vitals and nursing note reviewed.  Constitutional:      General: He is not in acute distress.    Appearance: He is not toxic-appearing.  HENT:     Head: Normocephalic and atraumatic.  Eyes:     General: No scleral icterus.    Conjunctiva/sclera: Conjunctivae normal.  Cardiovascular:     Rate and Rhythm: Normal rate and regular rhythm.     Pulses: Normal pulses.     Heart sounds: Normal heart sounds.  Pulmonary:     Effort: Pulmonary effort is normal. No respiratory distress.     Breath sounds: Normal breath sounds.  Abdominal:     General: Abdomen is flat. Bowel sounds are normal.     Palpations: Abdomen is soft.     Tenderness: There is no abdominal tenderness.  Musculoskeletal:     Comments: No erythema or edema to surrounding area or sign of rash. Pain to palpation over mid-low back bilaterally. No deformity or midline tenderness.   Skin:    General: Skin is warm and dry.     Findings: No lesion.  Neurological:     General: No focal deficit present.     Mental Status: He is alert and oriented to person, place, and time. Mental status is at baseline.     Sensory: No sensory deficit.     Motor: No weakness.     ED Results / Procedures / Treatments   Labs (all labs ordered are  listed, but only abnormal results are displayed) Labs Reviewed - No data to display  EKG None  Radiology No results found.  Procedures Procedures    Medications Ordered in ED Medications - No data to display  ED Course/ Medical Decision Making/ A&P                                 Medical Decision Making Amount and/or Complexity of Data Reviewed Labs: ordered. Radiology: ordered.  Risk  Prescription drug management.   This patient presents to the ED for concern of low back and urinary retention, this involves an extensive number of treatment options, and is a complaint that carries with it a high risk of complications and morbidity.  The differential diagnosis includes cauda equina syndrome, fracture, back strain, BPH, UTI, urinary retention    Co morbidities that complicate the patient evaluation  Opioid dependence Chronic pain  COPD CAD   Additional history obtained:  Additional history obtained from chart reviw    Lab Tests:  I Ordered, and personally interpreted labs.  The pertinent results include:   Cbc unremarkable  Cmp unremarkable  UA unremarkable    Imaging Studies ordered:  I ordered imaging studies including lumbar spine xray   Obtaining bladder scan - 43ml  I independently visualized and interpreted imaging which showed no acute fracture, and degenerative changes of lumbar disc, worse at L4-5 I agree with the radiologist interpretation   Cardiac Monitoring: / EKG:  Vitals monitored    Consultations Obtained:  No   Problem List / ED Course / Critical interventions / Medication management  Pt reports presenting with with low back pain that started this morning after opperating a push mower yesterday. The pain is in the center of his lower back, pain does not radiate to either side. Also reports he is having worsening symptoms of having difficulty with urination. Denies change in gait,  incontinence,  saddle anesthesia, weakness, or  change in sensation.  Patient had bladder scan upon arrival which showed 43 mL of urine in the bladder, which suggests he is not retaining urine.   Given patient's symptoms and past medical history of BPH, I encouraged him to follow-up with his primary care doctor for further evaluation of back pain and to assure resolution of symptoms. Patient also reports that he has a follow-up with the urologist on Friday due to BPH and will further address LUTS that are chronic for him  Pt reports he took Oxycodone prior to arrival to ED.  Reevaluation of the patient after these medicines showed that the patient improved I have reviewed the patients home medicines and have made adjustments as needed  Plan  Patient educated on concerning symptoms regarding low back pain and when to return to the ER. Pt expresses understanding and agrees to plan.  Follow-up with primary care for back pain Take ibuprofen that was sent to pharmacy as prescribed Alternate Tylenol and use ice and heat for back pain.  Use oxycodone for breakthrough back pain Follow-up with urology for lower urinary tract symptoms including hesitancy        Final Clinical Impression(s) / ED Diagnoses Final diagnoses:  None    Rx / DC Orders ED Discharge Orders     None         Raford Pitcher Evalee Jefferson 12/13/22 2008    Eber Hong, MD 12/14/22 1353

## 2022-12-18 DIAGNOSIS — I700 Atherosclerosis of aorta: Secondary | ICD-10-CM | POA: Diagnosis not present

## 2022-12-18 DIAGNOSIS — I10 Essential (primary) hypertension: Secondary | ICD-10-CM | POA: Diagnosis not present

## 2022-12-18 DIAGNOSIS — G894 Chronic pain syndrome: Secondary | ICD-10-CM | POA: Diagnosis not present

## 2022-12-18 DIAGNOSIS — J449 Chronic obstructive pulmonary disease, unspecified: Secondary | ICD-10-CM | POA: Diagnosis not present

## 2022-12-19 ENCOUNTER — Other Ambulatory Visit: Payer: Self-pay | Admitting: Urology

## 2022-12-19 ENCOUNTER — Ambulatory Visit: Payer: 59 | Admitting: Urology

## 2022-12-19 DIAGNOSIS — N401 Enlarged prostate with lower urinary tract symptoms: Secondary | ICD-10-CM

## 2023-01-14 DIAGNOSIS — G894 Chronic pain syndrome: Secondary | ICD-10-CM | POA: Diagnosis not present

## 2023-01-14 DIAGNOSIS — Z23 Encounter for immunization: Secondary | ICD-10-CM | POA: Diagnosis not present

## 2023-01-14 DIAGNOSIS — M1991 Primary osteoarthritis, unspecified site: Secondary | ICD-10-CM | POA: Diagnosis not present

## 2023-01-14 DIAGNOSIS — J449 Chronic obstructive pulmonary disease, unspecified: Secondary | ICD-10-CM | POA: Diagnosis not present

## 2023-01-14 DIAGNOSIS — I10 Essential (primary) hypertension: Secondary | ICD-10-CM | POA: Diagnosis not present

## 2023-01-22 DIAGNOSIS — E559 Vitamin D deficiency, unspecified: Secondary | ICD-10-CM | POA: Diagnosis not present

## 2023-01-22 DIAGNOSIS — I10 Essential (primary) hypertension: Secondary | ICD-10-CM | POA: Diagnosis not present

## 2023-01-22 DIAGNOSIS — J449 Chronic obstructive pulmonary disease, unspecified: Secondary | ICD-10-CM | POA: Diagnosis not present

## 2023-01-22 DIAGNOSIS — G894 Chronic pain syndrome: Secondary | ICD-10-CM | POA: Diagnosis not present

## 2023-01-22 DIAGNOSIS — R5383 Other fatigue: Secondary | ICD-10-CM | POA: Diagnosis not present

## 2023-02-18 DIAGNOSIS — I700 Atherosclerosis of aorta: Secondary | ICD-10-CM | POA: Diagnosis not present

## 2023-02-18 DIAGNOSIS — J449 Chronic obstructive pulmonary disease, unspecified: Secondary | ICD-10-CM | POA: Diagnosis not present

## 2023-02-18 DIAGNOSIS — I10 Essential (primary) hypertension: Secondary | ICD-10-CM | POA: Diagnosis not present

## 2023-02-18 DIAGNOSIS — J440 Chronic obstructive pulmonary disease with acute lower respiratory infection: Secondary | ICD-10-CM | POA: Diagnosis not present

## 2023-02-18 DIAGNOSIS — G894 Chronic pain syndrome: Secondary | ICD-10-CM | POA: Diagnosis not present

## 2023-02-20 ENCOUNTER — Ambulatory Visit (INDEPENDENT_AMBULATORY_CARE_PROVIDER_SITE_OTHER): Payer: 59 | Admitting: Urology

## 2023-02-20 VITALS — BP 132/77 | HR 75

## 2023-02-20 DIAGNOSIS — N401 Enlarged prostate with lower urinary tract symptoms: Secondary | ICD-10-CM | POA: Diagnosis not present

## 2023-02-20 DIAGNOSIS — R339 Retention of urine, unspecified: Secondary | ICD-10-CM | POA: Diagnosis not present

## 2023-02-20 DIAGNOSIS — N5201 Erectile dysfunction due to arterial insufficiency: Secondary | ICD-10-CM

## 2023-02-20 MED ORDER — FINASTERIDE 5 MG PO TABS: 5.0000 mg | ORAL_TABLET | Freq: Every day | ORAL | 3 refills | Status: DC

## 2023-02-20 MED ORDER — TAMSULOSIN HCL 0.4 MG PO CAPS: 0.4000 mg | ORAL_CAPSULE | Freq: Two times a day (BID) | ORAL | 3 refills | Status: DC

## 2023-02-20 NOTE — Progress Notes (Unsigned)
02/20/2023 10:06 AM   [name] [birth year] [id number]  Referring provider: Elfredia Nevins, MD [address] [place],  [place] [address]  No chief complaint on file.   HPI: IPSS 8 QOL 2 on flomax 0.4mg  daily and finasteride 5mg  daily.    PMH: Past Medical History:  Diagnosis Date   Anxiety    CAD (coronary artery disease)    Chronic pain    COPD (chronic obstructive pulmonary disease) (HCC)    emphysema   Dyslipidemia    History of tobacco abuse    80 pack year history    Hypertension    On home O2    4L N/C   Opioid dependence (HCC)    Respiratory failure (HCC)     Surgical History: Past Surgical History:  Procedure Laterality Date   CARDIAC CATHETERIZATION  08/10/2008   right & left - normal coronaries (Dr. Claudia Desanctis)   CATARACT EXTRACTION W/PHACO Left 01/31/2022   Procedure: CATARACT EXTRACTION PHACO AND INTRAOCULAR LENS PLACEMENT (IOC);  Surgeon: Pecolia Ades, MD;  Location: [facility];  Service: Ophthalmology;  Laterality: Left;  CDE 5.79   CATARACT EXTRACTION W/PHACO Right 02/14/2022   Procedure: CATARACT EXTRACTION PHACO AND INTRAOCULAR LENS PLACEMENT (IOC);  Surgeon: Pecolia Ades, MD;  Location: [facility];  Service: Ophthalmology;  Laterality: Right;  CDE 5.58   FLEXIBLE SIGMOIDOSCOPY  11/13/2020   Procedure: FLEXIBLE SIGMOIDOSCOPY;  Surgeon: Marguerita Merles, Reuel Boom, MD;  Location: [facility];  Service: Gastroenterology;;   NM MYOCAR PERF WALL MOTION  07/2008   bruce myoview - mild ischemia in basal inferoseptal & mid inferoseptal regions; EF 63%   POLYPECTOMY  11/13/2020   Procedure: POLYPECTOMY;  Surgeon: Dolores Frame, MD;  Location: [facility];  Service: Gastroenterology;;   TRANSTHORACIC ECHOCARDIOGRAM  07/2008   EF=>55%; RV mildly dilated; RA mild-mod dilated; mild mitral annular calcification & mild MR; AV mildly sclerotic    Home Medications:  Allergies as of 02/20/2023       Reactions   Ativan [lorazepam] Other  (See Comments)   Dry mouth sinus and breathing problems   Trazodone And Nefazodone    Unknown reaction        Medication List        Accurate as of February 20, 2023 10:06 AM. If you have any questions, ask your nurse or doctor.          albuterol (2.5 MG/3ML) 0.083% nebulizer solution Commonly known as: PROVENTIL Take 3 mLs (2.5 mg total) by nebulization every 6 (six) hours as needed for wheezing or shortness of breath. What changed: when to take this   albuterol 108 (90 Base) MCG/ACT inhaler Commonly known as: VENTOLIN HFA Inhale 2 puffs into the lungs every 6 (six) hours as needed for wheezing or shortness of breath. What changed: Another medication with the same name was changed. Make sure you understand how and when to take each.   amLODipine 10 MG tablet Commonly known as: NORVASC Take 1 tablet (10 mg total) by mouth daily.   aspirin EC 81 MG tablet Take 2 tablets (162 mg total) by mouth daily with breakfast.   CENTRUM ADULTS PO Take 1 capsule by mouth daily.   cephALEXin 500 MG capsule Commonly known as: KEFLEX Take 500 mg by mouth 4 (four) times daily.   dextromethorphan-guaiFENesin 30-600 MG 12hr tablet Commonly known as: MUCINEX DM Take 1 tablet by mouth 2 (two) times daily.   fexofenadine-pseudoephedrine 180-240 MG 24 hr tablet Commonly known as: ALLEGRA-D 24  Take 1 tablet by mouth daily.   finasteride 5 MG tablet Commonly known as: PROSCAR Take 1 tablet (5 mg total) by mouth daily.   furosemide 20 MG tablet Commonly known as: LASIX Take 1 tablet (20 mg total) by mouth daily. Hold for 1 week What changed: additional instructions   guaiFENesin 600 MG 12 hr tablet Commonly known as: MUCINEX Take 600 mg by mouth 2 (two) times daily as needed (congestion).   ibuprofen 600 MG tablet Commonly known as: ADVIL Take 1 tablet (600 mg total) by mouth every 6 (six) hours as needed.   loratadine 10 MG tablet Commonly known as: CLARITIN Take 10 mg by  mouth daily.   omeprazole 40 MG capsule Commonly known as: PRILOSEC Take 40 mg by mouth daily.   ondansetron 8 MG tablet Commonly known as: ZOFRAN Take 8 mg by mouth every 8 (eight) hours as needed for nausea or vomiting.   oxyCODONE-acetaminophen 10-325 MG tablet Commonly known as: PERCOCET Take 1 tablet by mouth 4 (four) times daily as needed for pain.   polyethylene glycol-electrolytes 420 g solution Commonly known as: TriLyte Take 4,000 mLs by mouth as directed.   potassium chloride SA 20 MEQ tablet Commonly known as: KLOR-CON M Take 20 mEq by mouth daily.   Propylene Glycol 0.6 % Soln Place 1 drop into both eyes 2 (two) times daily as needed (dry eyes).   sildenafil 100 MG tablet Commonly known as: VIAGRA Take 1 tablet (100 mg total) by mouth daily as needed for erectile dysfunction.   simvastatin 20 MG tablet Commonly known as: ZOCOR TAKE 1 TABLET BY MOUTH DAILY AT 6 PM. What changed: when to take this   tamsulosin 0.4 MG Caps capsule Commonly known as: FLOMAX TAKE 1 CAPUSLE BY MOUTH ONCE IN THE MORNING AND AT BEDTIME.( WILL NEED OFFICE VISIT FOR FUTURE REFILLS)   tiZANidine 4 MG tablet Commonly known as: ZANAFLEX Take 4 mg by mouth 3 (three) times daily as needed for muscle spasms.   vardenafil 20 MG tablet Commonly known as: LEVITRA Take 1 tablet (20 mg total) by mouth daily as needed for erectile dysfunction.        Allergies:  Allergies  Allergen Reactions   Ativan [Lorazepam] Other (See Comments)    Dry mouth sinus and breathing problems    Trazodone And Nefazodone     Unknown reaction    Family History: Family History  Problem Relation Age of Onset   Leukemia Mother    Suicidality Father     Social History:  reports that he quit smoking about 15 years ago. His smoking use included cigarettes. He started smoking about 48 years ago. He has never used smokeless tobacco. He reports that he does not drink alcohol and does not use  drugs.  ROS: All other review of systems were reviewed and are negative except what is noted above in HPI  Physical Exam: BP 132/77   Pulse 75   Constitutional:  Alert and oriented, No acute distress. HEENT: [place] AT, moist mucus membranes.  Trachea midline, no masses. Cardiovascular: No clubbing, cyanosis, or edema. Respiratory: Normal respiratory effort, no increased work of breathing. GI: Abdomen is soft, nontender, nondistended, no abdominal masses GU: No CVA tenderness.  Lymph: No cervical or inguinal lymphadenopathy. Skin: No rashes, bruises or suspicious lesions. Neurologic: Grossly intact, no focal deficits, moving all 4 extremities. Psychiatric: Normal mood and affect.  Laboratory Data: Lab Results  Component Value Date   WBC 9.7 12/13/2022   HGB  14.1 12/13/2022   HCT 42.0 12/13/2022   MCV 93.1 12/13/2022   PLT 274 12/13/2022    Lab Results  Component Value Date   CREATININE 1.39 (H) 12/13/2022    Lab Results  Component Value Date   PSA 0.9 11/07/2019    No results found for: "TESTOSTERONE"  No results found for: "HGBA1C"  Urinalysis    Component Value Date/Time   COLORURINE YELLOW 12/13/2022 1620   APPEARANCEUR CLEAR 12/13/2022 1620   APPEARANCEUR Clear 12/18/2021 0921   LABSPEC 1.024 12/13/2022 1620   PHURINE 5.0 12/13/2022 1620   GLUCOSEU NEGATIVE 12/13/2022 1620   HGBUR NEGATIVE 12/13/2022 1620   BILIRUBINUR NEGATIVE 12/13/2022 1620   BILIRUBINUR Negative 12/18/2021 0921   KETONESUR NEGATIVE 12/13/2022 1620   PROTEINUR NEGATIVE 12/13/2022 1620   NITRITE NEGATIVE 12/13/2022 1620   LEUKOCYTESUR NEGATIVE 12/13/2022 1620    Lab Results  Component Value Date   LABMICR Comment 12/18/2021   BACTERIA RARE (A) 06/30/2017    Pertinent Imaging: *** No results found for this or any previous visit.  No results found for this or any previous visit.  No results found for this or any previous visit.  No results found for this or any previous  visit.  No results found for this or any previous visit.  No valid procedures specified. No results found for this or any previous visit.  No results found for this or any previous visit.   Assessment & Plan:    1. Benign localized prostatic hyperplasia with lower urinary tract symptoms (LUTS) ***  2. Urinary retention ***  3. Erectile dysfunction due to arterial insufficiency ***   No follow-ups on file.  Wilkie Aye, MD  [facility] [place]

## 2023-02-21 LAB — PSA: Prostate Specific Ag, Serum: 1.2 ng/mL (ref 0.0–4.0)

## 2023-02-24 ENCOUNTER — Encounter: Payer: Self-pay | Admitting: Urology

## 2023-02-24 NOTE — Progress Notes (Signed)
Letter sent.

## 2023-02-24 NOTE — Patient Instructions (Signed)

## 2023-03-16 DIAGNOSIS — I10 Essential (primary) hypertension: Secondary | ICD-10-CM | POA: Diagnosis not present

## 2023-03-16 DIAGNOSIS — G894 Chronic pain syndrome: Secondary | ICD-10-CM | POA: Diagnosis not present

## 2023-03-16 DIAGNOSIS — E7489 Other specified disorders of carbohydrate metabolism: Secondary | ICD-10-CM | POA: Diagnosis not present

## 2023-03-16 DIAGNOSIS — J449 Chronic obstructive pulmonary disease, unspecified: Secondary | ICD-10-CM | POA: Diagnosis not present

## 2023-03-16 DIAGNOSIS — M1991 Primary osteoarthritis, unspecified site: Secondary | ICD-10-CM | POA: Diagnosis not present

## 2023-03-21 DIAGNOSIS — I2510 Atherosclerotic heart disease of native coronary artery without angina pectoris: Secondary | ICD-10-CM | POA: Diagnosis not present

## 2023-03-21 DIAGNOSIS — J449 Chronic obstructive pulmonary disease, unspecified: Secondary | ICD-10-CM | POA: Diagnosis not present

## 2023-03-23 ENCOUNTER — Encounter (HOSPITAL_COMMUNITY): Payer: Self-pay | Admitting: Emergency Medicine

## 2023-03-23 ENCOUNTER — Emergency Department (HOSPITAL_COMMUNITY): Payer: 59

## 2023-03-23 ENCOUNTER — Other Ambulatory Visit: Payer: Self-pay

## 2023-03-23 ENCOUNTER — Emergency Department (HOSPITAL_COMMUNITY)
Admission: EM | Admit: 2023-03-23 | Discharge: 2023-03-23 | Disposition: A | Discharge status: Home / Self Care | Payer: 59 | Attending: Emergency Medicine | Admitting: Emergency Medicine

## 2023-03-23 DIAGNOSIS — R051 Acute cough: Secondary | ICD-10-CM | POA: Insufficient documentation

## 2023-03-23 DIAGNOSIS — Z7982 Long term (current) use of aspirin: Secondary | ICD-10-CM | POA: Diagnosis not present

## 2023-03-23 DIAGNOSIS — J449 Chronic obstructive pulmonary disease, unspecified: Secondary | ICD-10-CM | POA: Diagnosis not present

## 2023-03-23 DIAGNOSIS — R058 Other specified cough: Secondary | ICD-10-CM | POA: Diagnosis not present

## 2023-03-23 DIAGNOSIS — R059 Cough, unspecified: Secondary | ICD-10-CM | POA: Diagnosis present

## 2023-03-23 MED ORDER — ALBUTEROL SULFATE HFA 108 (90 BASE) MCG/ACT IN AERS
2.0000 | INHALATION_SPRAY | Freq: Once | RESPIRATORY_TRACT | Status: AC
  Administered 2023-03-23: 2 via RESPIRATORY_TRACT
  Filled 2023-03-23: qty 6.7

## 2023-03-23 MED ORDER — PREDNISONE 20 MG PO TABS: ORAL_TABLET | ORAL | 0 refills | Status: DC

## 2023-03-23 MED ORDER — DOXYCYCLINE HYCLATE 100 MG PO CAPS: 100.0000 mg | ORAL_CAPSULE | Freq: Two times a day (BID) | ORAL | 0 refills | Status: DC

## 2023-03-23 MED ORDER — PREDNISONE 50 MG PO TABS
60.0000 mg | ORAL_TABLET | Freq: Once | ORAL | Status: AC
  Administered 2023-03-23: 60 mg via ORAL
  Filled 2023-03-23: qty 1

## 2023-03-23 MED ORDER — DOXYCYCLINE HYCLATE 100 MG PO TABS
100.0000 mg | ORAL_TABLET | Freq: Once | ORAL | Status: AC
  Administered 2023-03-23: 100 mg via ORAL
  Filled 2023-03-23: qty 1

## 2023-03-23 NOTE — ED Triage Notes (Signed)
Pt with c/o cough x 4-5 days. States he "believes he has a touch of Pneumonia". Reports yellow sputum with cough.

## 2023-03-23 NOTE — ED Provider Notes (Signed)
[place] EMERGENCY DEPARTMENT AT [facility] Provider Note   CSN: [id number] Arrival date & time: 03/23/23  [id number]     History {Add pertinent medical, surgical, social history, OB history to HPI:1} Chief Complaint  Patient presents with   Cough    [name] is a 72 y.o. male.  73 year old male with history of COPD no longer smokes and presents ER today with for 5 days of progressively worsening cough now with yellow mucus.  Some chills but no fevers.  No chest pain besides with coughing.  No back pain.  Does not wear oxygen at home.  Has inhalers which he has been using as instructed.  Does not seem to be improving like he should.  Feels like he did when he had pneumonia in the past.  No lower extremity swelling.  No other associated symptoms.   Cough      Home Medications Prior to Admission medications   Medication Sig Start Date End Date Taking? Authorizing Provider  albuterol (PROVENTIL) (2.5 MG/3ML) 0.083% nebulizer solution Take 3 mLs (2.5 mg total) by nebulization every 6 (six) hours as needed for wheezing or shortness of breath. Patient taking differently: Take 2.5 mg by nebulization 3 (three) times daily. 11/29/17   Vassie Loll, MD  albuterol (VENTOLIN HFA) 108 (90 Base) MCG/ACT inhaler Inhale 2 puffs into the lungs every 6 (six) hours as needed for wheezing or shortness of breath. 01/27/21   Horton, Clabe Seal, DO  amLODipine (NORVASC) 10 MG tablet Take 1 tablet (10 mg total) by mouth daily. 02/27/20   Shon Hale, MD  aspirin EC 81 MG tablet Take 2 tablets (162 mg total) by mouth daily with breakfast. 02/27/20   Emokpae, Courage, MD  cephALEXin (KEFLEX) 500 MG capsule Take 500 mg by mouth 4 (four) times daily.    [provider]  dextromethorphan-guaiFENesin (MUCINEX DM) 30-600 MG 12hr tablet Take 1 tablet by mouth 2 (two) times daily. 01/27/21   Horton, Kristie M, DO  fexofenadine-pseudoephedrine (ALLEGRA-D 24) 180-240 MG 24 hr tablet Take 1  tablet by mouth daily.    [provider]  finasteride (PROSCAR) 5 MG tablet Take 1 tablet (5 mg total) by mouth daily. 02/20/23   McKenzie, Mardene Celeste, MD  furosemide (LASIX) 20 MG tablet Take 1 tablet (20 mg total) by mouth daily. Hold for 1 week Patient taking differently: Take 20 mg by mouth daily. 03/05/20   Shon Hale, MD  guaiFENesin (MUCINEX) 600 MG 12 hr tablet Take 600 mg by mouth 2 (two) times daily as needed (congestion).    [provider]  ibuprofen (ADVIL) 600 MG tablet Take 1 tablet (600 mg total) by mouth every 6 (six) hours as needed. 12/13/22   Barrett, Horald Chestnut, PA-C  loratadine (CLARITIN) 10 MG tablet Take 10 mg by mouth daily.    [provider]  Multiple Vitamins-Minerals (CENTRUM ADULTS PO) Take 1 capsule by mouth daily.    [provider]  omeprazole (PRILOSEC) 40 MG capsule Take 40 mg by mouth daily. 01/10/13   [provider]  ondansetron (ZOFRAN) 8 MG tablet Take 8 mg by mouth every 8 (eight) hours as needed for nausea or vomiting. 11/21/17   [provider]  oxyCODONE-acetaminophen (PERCOCET) 10-325 MG tablet Take 1 tablet by mouth 4 (four) times daily as needed for pain.    [provider]  polyethylene glycol-electrolytes (TRILYTE) 420 g solution Take 4,000 mLs by mouth as directed. 11/13/20   Dolores Frame, MD  potassium chloride SA (KLOR-CON) 20 MEQ tablet Take 20 mEq by mouth daily. 08/13/19   [provider]  Propylene Glycol 0.6 % SOLN Place 1 drop into both eyes 2 (two) times daily as needed (dry eyes).    [provider]  sildenafil (VIAGRA) 100 MG tablet Take 1 tablet (100 mg total) by mouth daily as needed for erectile dysfunction. 12/18/20   McKenzie, Mardene Celeste, MD  simvastatin (ZOCOR) 20 MG tablet TAKE 1 TABLET BY MOUTH DAILY AT 6 PM. Patient taking differently: Take 20 mg by mouth daily at 6 PM. 09/21/15   Laqueta Linden, MD  tamsulosin (FLOMAX) 0.4 MG CAPS capsule  Take 1 capsule (0.4 mg total) by mouth in the morning and at bedtime. 02/20/23   McKenzie, Mardene Celeste, MD  tiZANidine (ZANAFLEX) 4 MG tablet Take 4 mg by mouth 3 (three) times daily as needed for muscle spasms. 01/03/17   [provider]  vardenafil (LEVITRA) 20 MG tablet Take 1 tablet (20 mg total) by mouth daily as needed for erectile dysfunction. 12/18/21   McKenzie, Mardene Celeste, MD      Allergies    Ativan [lorazepam] and Trazodone and nefazodone    Review of Systems   Review of Systems  Respiratory:  Positive for cough.     Physical Exam Updated Vital Signs BP (!) 160/64 (BP Location: Left Arm)   Pulse 74   Temp 97.9 F (36.6 C) (Oral)   Resp 20   Ht 5\' 9"  (1.753 m)   Wt 80.7 kg   SpO2 93%   BMI 26.29 kg/m  Physical Exam Vitals and nursing note reviewed.  Constitutional:      Appearance: He is well-developed.  HENT:     Head: Normocephalic and atraumatic.  Cardiovascular:     Rate and Rhythm: Normal rate.  Pulmonary:     Effort: Pulmonary effort is normal. No respiratory distress.     Breath sounds: Decreased air movement present.     Comments: Newly decreased air movement but no wheezing Abdominal:     General: There is no distension.  Musculoskeletal:        General: Normal range of motion.     Cervical back: Normal range of motion.  Skin:    General: Skin is warm and dry.  Neurological:     Mental Status: He is alert.     ED Results / Procedures / Treatments   Labs (all labs ordered are listed, but only abnormal results are displayed) Labs Reviewed - No data to display  EKG None  Radiology No results found.  Procedures Procedures  {Document cardiac monitor, telemetry assessment procedure when appropriate:1}  Medications Ordered in ED Medications - No data to display  ED Course/ Medical Decision Making/ A&P   {   Click here for ABCD2, HEART and other calculatorsREFRESH Note before signing :1}                              Medical  Decision Making  ***  {Document critical care time when appropriate:1} {Document review of labs and clinical decision tools ie heart score, Chads2Vasc2 etc:1}  {Document your independent review of radiology images, and any outside records:1} {Document your discussion with family members, caretakers, and with consultants:1} {Document social determinants of health affecting pt's care:1} {Document your decision making why or why not admission, treatments were needed:1} Final Clinical Impression(s) / ED Diagnoses Final diagnoses:  None  Rx / DC Orders ED Discharge Orders     None

## 2023-03-23 NOTE — ED Notes (Signed)
X-ray at bedside

## 2023-04-10 DIAGNOSIS — I10 Essential (primary) hypertension: Secondary | ICD-10-CM | POA: Diagnosis not present

## 2023-04-10 DIAGNOSIS — I700 Atherosclerosis of aorta: Secondary | ICD-10-CM | POA: Diagnosis not present

## 2023-04-10 DIAGNOSIS — G894 Chronic pain syndrome: Secondary | ICD-10-CM | POA: Diagnosis not present

## 2023-04-10 DIAGNOSIS — E7489 Other specified disorders of carbohydrate metabolism: Secondary | ICD-10-CM | POA: Diagnosis not present

## 2023-04-10 DIAGNOSIS — J449 Chronic obstructive pulmonary disease, unspecified: Secondary | ICD-10-CM | POA: Diagnosis not present

## 2023-06-01 DIAGNOSIS — G894 Chronic pain syndrome: Secondary | ICD-10-CM | POA: Diagnosis not present

## 2023-06-01 DIAGNOSIS — M1991 Primary osteoarthritis, unspecified site: Secondary | ICD-10-CM | POA: Diagnosis not present

## 2023-06-01 DIAGNOSIS — J449 Chronic obstructive pulmonary disease, unspecified: Secondary | ICD-10-CM | POA: Diagnosis not present

## 2023-06-01 DIAGNOSIS — I10 Essential (primary) hypertension: Secondary | ICD-10-CM | POA: Diagnosis not present

## 2023-07-27 DIAGNOSIS — J449 Chronic obstructive pulmonary disease, unspecified: Secondary | ICD-10-CM | POA: Diagnosis not present

## 2023-07-27 DIAGNOSIS — G894 Chronic pain syndrome: Secondary | ICD-10-CM | POA: Diagnosis not present

## 2023-07-27 DIAGNOSIS — M1991 Primary osteoarthritis, unspecified site: Secondary | ICD-10-CM | POA: Diagnosis not present

## 2023-08-12 ENCOUNTER — Emergency Department (HOSPITAL_COMMUNITY)
Admission: EM | Admit: 2023-08-12 | Discharge: 2023-08-12 | Disposition: A | Discharge status: Home / Self Care | Attending: Emergency Medicine | Admitting: Emergency Medicine

## 2023-08-12 ENCOUNTER — Emergency Department (HOSPITAL_COMMUNITY)

## 2023-08-12 ENCOUNTER — Other Ambulatory Visit: Payer: Self-pay

## 2023-08-12 ENCOUNTER — Encounter (HOSPITAL_COMMUNITY): Payer: Self-pay | Admitting: Emergency Medicine

## 2023-08-12 DIAGNOSIS — J449 Chronic obstructive pulmonary disease, unspecified: Secondary | ICD-10-CM | POA: Diagnosis not present

## 2023-08-12 DIAGNOSIS — J441 Chronic obstructive pulmonary disease with (acute) exacerbation: Secondary | ICD-10-CM | POA: Diagnosis not present

## 2023-08-12 DIAGNOSIS — R600 Localized edema: Secondary | ICD-10-CM | POA: Insufficient documentation

## 2023-08-12 DIAGNOSIS — Z79899 Other long term (current) drug therapy: Secondary | ICD-10-CM | POA: Diagnosis not present

## 2023-08-12 DIAGNOSIS — Z7982 Long term (current) use of aspirin: Secondary | ICD-10-CM | POA: Diagnosis not present

## 2023-08-12 DIAGNOSIS — Z7952 Long term (current) use of systemic steroids: Secondary | ICD-10-CM | POA: Diagnosis not present

## 2023-08-12 DIAGNOSIS — I2510 Atherosclerotic heart disease of native coronary artery without angina pectoris: Secondary | ICD-10-CM | POA: Diagnosis not present

## 2023-08-12 DIAGNOSIS — I10 Essential (primary) hypertension: Secondary | ICD-10-CM | POA: Insufficient documentation

## 2023-08-12 DIAGNOSIS — Z87891 Personal history of nicotine dependence: Secondary | ICD-10-CM | POA: Diagnosis not present

## 2023-08-12 DIAGNOSIS — R0602 Shortness of breath: Secondary | ICD-10-CM | POA: Diagnosis not present

## 2023-08-12 LAB — CBC
HCT: 43.5 % (ref 39.0–52.0)
Hemoglobin: 14.8 g/dL (ref 13.0–17.0)
MCH: 31.6 pg (ref 26.0–34.0)
MCHC: 34 g/dL (ref 30.0–36.0)
MCV: 92.8 fL (ref 80.0–100.0)
Platelets: 279 10*3/uL (ref 150–400)
RBC: 4.69 MIL/uL (ref 4.22–5.81)
RDW: 12.3 % (ref 11.5–15.5)
WBC: 9.9 10*3/uL (ref 4.0–10.5)
nRBC: 0 % (ref 0.0–0.2)

## 2023-08-12 LAB — BLOOD GAS, VENOUS
Acid-Base Excess: 6.9 mmol/L — ABNORMAL HIGH (ref 0.0–2.0)
Bicarbonate: 32 mmol/L — ABNORMAL HIGH (ref 20.0–28.0)
Drawn by: 442
O2 Saturation: 76.5 %
Patient temperature: 36.4
pCO2, Ven: 45 mmHg (ref 44–60)
pH, Ven: 7.46 — ABNORMAL HIGH (ref 7.25–7.43)
pO2, Ven: 39 mmHg (ref 32–45)

## 2023-08-12 LAB — BASIC METABOLIC PANEL WITH GFR
Anion gap: 9 (ref 5–15)
BUN: 8 mg/dL (ref 8–23)
CO2: 25 mmol/L (ref 22–32)
Calcium: 9 mg/dL (ref 8.9–10.3)
Chloride: 102 mmol/L (ref 98–111)
Creatinine, Ser: 0.84 mg/dL (ref 0.61–1.24)
GFR, Estimated: >60 mL/min (ref >60)
Glucose, Bld: 119 mg/dL — ABNORMAL HIGH (ref 70–99)
Potassium: 4 mmol/L (ref 3.5–5.1)
Sodium: 136 mmol/L (ref 135–145)

## 2023-08-12 LAB — RESP PANEL BY RT-PCR (RSV, FLU A&B, COVID)  RVPGX2
Influenza A by PCR: NEGATIVE
Influenza B by PCR: NEGATIVE
Resp Syncytial Virus by PCR: NEGATIVE
SARS Coronavirus 2 by RT PCR: NEGATIVE

## 2023-08-12 LAB — BRAIN NATRIURETIC PEPTIDE: B Natriuretic Peptide: 21 pg/mL (ref 0.0–100.0)

## 2023-08-12 LAB — TROPONIN I (HIGH SENSITIVITY): Troponin I (High Sensitivity): 4 ng/L (ref <18)

## 2023-08-12 MED ORDER — AMOXICILLIN-POT CLAVULANATE 875-125 MG PO TABS: 1.0000 | ORAL_TABLET | Freq: Two times a day (BID) | ORAL | 0 refills | Status: DC

## 2023-08-12 MED ORDER — IPRATROPIUM-ALBUTEROL 0.5-2.5 (3) MG/3ML IN SOLN
3.0000 mL | Freq: Once | RESPIRATORY_TRACT | Status: AC
  Administered 2023-08-12: 3 mL via RESPIRATORY_TRACT
  Filled 2023-08-12: qty 3

## 2023-08-12 MED ORDER — PREDNISONE 50 MG PO TABS: 50.0000 mg | ORAL_TABLET | Freq: Every day | ORAL | 0 refills | Status: DC

## 2023-08-12 MED ORDER — METHYLPREDNISOLONE SODIUM SUCC 125 MG IJ SOLR
125.0000 mg | Freq: Once | INTRAMUSCULAR | Status: AC
  Administered 2023-08-12: 125 mg via INTRAVENOUS
  Filled 2023-08-12: qty 2

## 2023-08-12 NOTE — ED Notes (Signed)
 Walked around nurses station with pt and pulse ox. O2 got as high as 92% but stayed between 90 and 91% majority of the time walking until pt was talking then it dropped to 87% momentarily.

## 2023-08-12 NOTE — ED Triage Notes (Addendum)
 Pt c/o of being sob x 4 days. Hx of COPD. Sats 95 on RA. Pt took 1 albuterol  tx earlier this morning w/ no relief. Pt in NAD.

## 2023-08-12 NOTE — Discharge Instructions (Addendum)
 We evaluated you for your shortness of breath.  Your symptoms improved with treatment in the emergency department.  We believe that you have a mild exacerbation of COPD.  We have prescribed you a course of steroids.  Please start these tomorrow as you received steroids in the emergency department today.  We have also prescribed you a course of antibiotics which you can start today.  Please follow-up closely with your primary doctor.  If you have any new or worsening symptoms, please return to the emergency department.

## 2023-08-12 NOTE — ED Provider Notes (Signed)
 [place] EMERGENCY DEPARTMENT AT [facility] Provider Note  CSN: [id number] Arrival date & time: 08/12/23 0710  Chief Complaint(s) Shortness of Breath  HPI [name] is a 73 y.o. male history of COPD, hyperlipidemia presenting to the emergency department with shortness of breath.  Patient reports he has been short of breath for the past 4 days, having some cough with productive sputum.  Reports some wheezing, chest tightness.  No fevers or chills.  No chest pain.  No lightheadedness or dizziness.  No fainting.  No recent travel or surgeries.  Tried to use his albuterol  at home today without improvement.   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   Vitamin D  insufficiency 04/01/2016   Elevated C-reactive protein (CRP) 03/28/2016   Chronic pain syndrome 03/26/2016   Chronic bilateral low back pain without sciatica (R>L) 03/26/2016   Long term current use of opiate analgesic 03/26/2016   Long term prescription opiate use 03/26/2016   Encounter for therapeutic drug level monitoring 03/26/2016   Encounter for pain management planning 03/26/2016   Chronic sacroiliac joint pain (B) (R>L) 03/26/2016   Chronic hip pain, left 03/26/2016   Chronic right hip pain 03/26/2016   Chronic hip pain (B) (L>R)  03/26/2016   Chronic left sacroiliac pain 03/26/2016   Chronic right sacroiliac pain 03/26/2016   PNA (pneumonia) 12/25/2015   Chronic respiratory failure with hypoxia (HCC) 12/25/2015   COPD exacerbation (HCC)    Sepsis secondary to C. difficile colitis 07/03/2015   CAP (community acquired pneumonia) 07/03/2015   Acute on chronic respiratory failure with hypoxia (HCC) 07/03/2015   GERD (gastroesophageal reflux disease) 07/03/2015   Coronary artery disease 10/13/2013   Anxiety 10/13/2013   Dyslipidemia 06/23/2013   COPD (chronic obstructive pulmonary disease) (HCC) 06/23/2013   SHOULDER PAIN 08/16/2008   SHOULDER IMPINGEMENT SYNDROME, LEFT 08/16/2008   Home Medication(s) Prior to Admission medications   Medication Sig Start Date End Date Taking? Authorizing Provider  amoxicillin -clavulanate (AUGMENTIN ) 875-125 MG tablet Take 1 tablet by mouth every 12 (twelve) hours. 08/12/23  Yes Mordecai Applebaum, MD  predniSONE  (DELTASONE ) 50 MG tablet Take 1 tablet (50 mg total) by mouth daily with breakfast. 08/13/23  Yes Mordecai Applebaum, MD  albuterol  (PROVENTIL ) (2.5 MG/3ML) 0.083% nebulizer solution Take 3 mLs (2.5 mg total) by nebulization every 6 (six) hours as needed for wheezing or shortness of breath. Patient taking differently: Take 2.5 mg by nebulization 3 (three) times daily. 11/29/17   Justina Oman, MD  albuterol  (VENTOLIN  HFA) 108 [phone] Base) MCG/ACT inhaler Inhale 2 puffs into the lungs every 6 (six) hours as needed for wheezing or shortness of breath. 01/27/21   Horton, Sidra Dredge, DO  amLODipine  (NORVASC ) 10  MG tablet Take 1 tablet (10 mg total) by mouth daily. 02/27/20   Colin Dawley, MD  aspirin  EC 81 MG tablet Take 2 tablets (162 mg total) by mouth daily with breakfast. 02/27/20   Colin Dawley, MD  cephALEXin  (KEFLEX ) 500 MG capsule Take 500 mg by mouth 4 (four) times daily.    [provider]  dextromethorphan -guaiFENesin  (MUCINEX  DM) 30-600 MG 12hr tablet Take 1  tablet by mouth 2 (two) times daily. 01/27/21   Horton, Kristie M, DO  doxycycline  (VIBRAMYCIN ) 100 MG capsule Take 1 capsule (100 mg total) by mouth 2 (two) times daily. One po bid x 7 days 03/23/23   Mesner, Jason, MD  fexofenadine-pseudoephedrine (ALLEGRA-D 24) 180-240 MG 24 hr tablet Take 1 tablet by mouth daily.    [provider]  finasteride  (PROSCAR ) 5 MG tablet Take 1 tablet (5 mg total) by mouth daily. 02/20/23   McKenzie, Arden Beck, MD  furosemide  (LASIX ) 20 MG tablet Take 1 tablet (20 mg total) by mouth daily. Hold for 1 week Patient taking differently: Take 20 mg by mouth daily. 03/05/20   Colin Dawley, MD  guaiFENesin  (MUCINEX ) 600 MG 12 hr tablet Take 600 mg by mouth 2 (two) times daily as needed (congestion).    [provider]  ibuprofen  (ADVIL ) 600 MG tablet Take 1 tablet (600 mg total) by mouth every 6 (six) hours as needed. 12/13/22   Barrett, Kandace Organ, PA-C  loratadine  (CLARITIN ) 10 MG tablet Take 10 mg by mouth daily.    [provider]  Multiple Vitamins-Minerals (CENTRUM ADULTS PO) Take 1 capsule by mouth daily.    [provider]  omeprazole (PRILOSEC) 40 MG capsule Take 40 mg by mouth daily. 01/10/13   [provider]  ondansetron  (ZOFRAN ) 8 MG tablet Take 8 mg by mouth every 8 (eight) hours as needed for nausea or vomiting. 11/21/17   [provider]  oxyCODONE -acetaminophen  (PERCOCET) 10-325 MG tablet Take 1 tablet by mouth 4 (four) times daily as needed for pain.    [provider]  polyethylene glycol-electrolytes (TRILYTE) 420 g solution Take 4,000 mLs by mouth as directed. 11/13/20   Castaneda Mayorga, Daniel, MD  potassium chloride  SA (KLOR-CON ) 20 MEQ tablet Take 20 mEq by mouth daily. 08/13/19   [provider]  Propylene Glycol 0.6 % SOLN Place 1 drop into both eyes 2 (two) times daily as needed (dry eyes).    [provider]  sildenafil  (VIAGRA ) 100 MG tablet Take 1 tablet (100 mg total) by  mouth daily as needed for erectile dysfunction. 12/18/20   McKenzie, Arden Beck, MD  simvastatin  (ZOCOR ) 20 MG tablet TAKE 1 TABLET BY MOUTH DAILY AT 6 PM. Patient taking differently: Take 20 mg by mouth daily at 6 PM. 09/21/15   Flavia Hughs, MD  tamsulosin  (FLOMAX ) 0.4 MG CAPS capsule Take 1 capsule (0.4 mg total) by mouth in the morning and at bedtime. 02/20/23   McKenzie, Arden Beck, MD  tiZANidine  (ZANAFLEX ) 4 MG tablet Take 4 mg by mouth 3 (three) times daily as needed for muscle spasms. 01/03/17   [provider]  vardenafil  (LEVITRA ) 20 MG tablet Take 1 tablet (20 mg total) by mouth daily as needed for erectile dysfunction. 12/18/21   McKenzie, Arden Beck, MD  Past Surgical History Past Surgical History:  Procedure Laterality Date   CARDIAC CATHETERIZATION  08/10/2008   right & left - normal coronaries (Dr. Tammy Fallen)   CATARACT EXTRACTION W/PHACO Left 01/31/2022   Procedure: CATARACT EXTRACTION PHACO AND INTRAOCULAR LENS PLACEMENT (IOC);  Surgeon: Ardeth Krabbe, MD;  Location: [facility];  Service: Ophthalmology;  Laterality: Left;  CDE 5.79   CATARACT EXTRACTION W/PHACO Right 02/14/2022   Procedure: CATARACT EXTRACTION PHACO AND INTRAOCULAR LENS PLACEMENT (IOC);  Surgeon: Ardeth Krabbe, MD;  Location: [facility];  Service: Ophthalmology;  Laterality: Right;  CDE 5.58   FLEXIBLE SIGMOIDOSCOPY  11/13/2020   Procedure: FLEXIBLE SIGMOIDOSCOPY;  Surgeon: Umberto Ganong, Bearl Limes, MD;  Location: [facility];  Service: Gastroenterology;;   NM MYOCAR PERF WALL MOTION  07/2008   bruce myoview - mild ischemia in basal inferoseptal & mid inferoseptal regions; EF 63%   POLYPECTOMY  11/13/2020   Procedure: POLYPECTOMY;  Surgeon: Urban Garden, MD;  Location: [facility];  Service: Gastroenterology;;   TRANSTHORACIC ECHOCARDIOGRAM  07/2008    EF=>55%; RV mildly dilated; RA mild-mod dilated; mild mitral annular calcification & mild MR; AV mildly sclerotic   Family History Family History  Problem Relation Age of Onset   Leukemia Mother    Suicidality Father     Social History Social History   Tobacco Use   Smoking status: Former    Current packs/day: 0.00    Types: Cigarettes    Start date: 04/21/1974    Quit date: 06/22/2007    Years since quitting: 16.1   Smokeless tobacco: Never  Vaping Use   Vaping status: Never Used  Substance Use Topics   Alcohol  use: No    Alcohol /week: 0.0 standard drinks of alcohol    Drug use: No   Allergies Ativan  [lorazepam ] and Trazodone and nefazodone  Review of Systems Review of Systems  All other systems reviewed and are negative.   Physical Exam Vital Signs  I have reviewed the triage vital signs BP 118/88   Pulse 63   Temp (!) 97.5 F (36.4 C) (Oral)   Resp (!) 25   Ht 5\' 9"  (1.753 m)   Wt 81.6 kg   SpO2 93%   BMI 26.58 kg/m  Physical Exam Vitals and nursing note reviewed.  Constitutional:      General: He is not in acute distress.    Appearance: Normal appearance.  HENT:     Mouth/Throat:     Mouth: Mucous membranes are moist.  Eyes:     Conjunctiva/sclera: Conjunctivae normal.  Neck:     Vascular: No hepatojugular reflux or JVD.  Cardiovascular:     Rate and Rhythm: Normal rate and regular rhythm.  Pulmonary:     Effort: Pulmonary effort is normal. No respiratory distress.     Breath sounds: Wheezing (trace diffuse) present.  Abdominal:     General: Abdomen is flat.     Palpations: Abdomen is soft.     Tenderness: There is no abdominal tenderness.  Musculoskeletal:     Right lower leg: No tenderness. Edema present.     Left lower leg: No tenderness. Edema present.     Comments: Trace edema b/l    Skin:    General: Skin is warm and dry.     Capillary Refill: Capillary refill takes less than 2 seconds.  Neurological:     Mental Status: He is alert  and oriented to person, place, and time. Mental status is at baseline.  Psychiatric:  Mood and Affect: Mood normal.        Behavior: Behavior normal.     ED Results and Treatments Labs (all labs ordered are listed, but only abnormal results are displayed) Labs Reviewed  BASIC METABOLIC PANEL WITH GFR - Abnormal; Notable for the following components:      Result Value   Glucose, Bld 119 (*)    All other components within normal limits  BLOOD GAS, VENOUS - Abnormal; Notable for the following components:   pH, Ven 7.46 (*)    Bicarbonate 32.0 (*)    Acid-Base Excess 6.9 (*)    All other components within normal limits  RESP PANEL BY RT-PCR (RSV, FLU A&B, COVID)  RVPGX2  CBC  BRAIN NATRIURETIC PEPTIDE  TROPONIN I (HIGH SENSITIVITY)                                                                                                                          Radiology DG Chest 2 View Result Date: 08/12/2023 CLINICAL DATA:  COPD.  Shortness of breath EXAM: CHEST - 2 VIEW COMPARISON:  03/23/2023 FINDINGS: Normal heart size and mediastinal contours. Chronic interstitial coarsening with hyperinflation consistent with the history. No acute infiltrate or edema. No effusion or pneumothorax. No acute osseous findings. Midthoracic vertebral body wedging is stable and mild. IMPRESSION: No acute or interval finding. Electronically Signed   By: Ronnette Coke M.D.   On: 08/12/2023 07:59    Pertinent labs & imaging results that were available during my care of the patient were reviewed by me and considered in my medical decision making (see MDM for details).  Medications Ordered in ED Medications  ipratropium-albuterol  (DUONEB) 0.5-2.5 (3) MG/3ML nebulizer solution 3 mL (3 mLs Nebulization Given 08/12/23 0817)  methylPREDNISolone  sodium succinate (SOLU-MEDROL ) 125 mg/2 mL injection 125 mg (125 mg Intravenous Given 08/12/23 0753)                                                                                                                                      Procedures Procedures  (including critical care time)  Medical Decision Making / ED Course   MDM:  73 year old presenting with shortness of breath.  Suspect mild COPD exacerbation.  Seems less likely to be CHF.  Chest x-ray is clear.  Has some minimal lower extremity swelling but no JVD.  Also considered pneumonia, chest x-ray clear with no infiltrate,  no focal pulmonary findings.  Doubt PE, no tachycardia, risk factors, no pleuritic pain.  Chest x-ray without evidence of pneumothorax.  Will give breathing treatment, steroids.  Will reassess.  Initially had some mild hypoxia but off oxygen  currently.  Will see if this improves following treatment.  Clinical Course as of 08/12/23 8119  Wed Aug 12, 2023  0921 Patient reports his symptoms have resolved after receiving breathing treatment.  Has had some intermittent low oxygen  levels but patient reports that he feels fine.  Ambulated with nursing and primarily 90 to 92%.  Had transient desaturation but felt fine.  Do not think he needs to be admitted for oxygen  at this time.  He reports that he was previously on oxygen  but has since been told that as long as his oxygen  level is above 90 he should be okay.  Advise close follow-up with his PMD and strict return precautions for any worsening.  Will prescribe steroids and antibiotics for COPD exacerbation. Will discharge patient to home. All questions answered. Patient comfortable with plan of discharge. Return precautions discussed with patient and specified on the after visit summary.  [WS]    Clinical Course User Index [WS] Mordecai Applebaum, MD     Additional history obtained: -Additional history obtained from ems -External records from outside source obtained and reviewed including: Chart review including previous notes, labs, imaging, consultation notes including prior notes    Lab Tests: -I ordered, reviewed, and interpreted  labs.   The pertinent results include:   Labs Reviewed  BASIC METABOLIC PANEL WITH GFR - Abnormal; Notable for the following components:      Result Value   Glucose, Bld 119 (*)    All other components within normal limits  BLOOD GAS, VENOUS - Abnormal; Notable for the following components:   pH, Ven 7.46 (*)    Bicarbonate 32.0 (*)    Acid-Base Excess 6.9 (*)    All other components within normal limits  RESP PANEL BY RT-PCR (RSV, FLU A&B, COVID)  RVPGX2  CBC  BRAIN NATRIURETIC PEPTIDE  TROPONIN I (HIGH SENSITIVITY)    Notable for no hypercapnea, normal troponin, normal BNP  EKG   EKG Interpretation Date/Time:  Wednesday August 12 2023 07:27:11 EDT Ventricular Rate:  86 PR Interval:  155 QRS Duration:  107 QT Interval:  380 QTC Calculation: 455 R Axis:   23  Text Interpretation: Sinus rhythm Anteroseptal infarct, age indeterminate Baseline wander Confirmed by Hiawatha Lout ([id number]) on 08/12/2023 8:07:03 AM         Imaging Studies ordered: I ordered imaging studies including CXR On my interpretation imaging demonstrates no focal finding I independently visualized and interpreted imaging. I agree with the radiologist interpretation   Medicines ordered and prescription drug management: Meds ordered this encounter  Medications   ipratropium-albuterol  (DUONEB) 0.5-2.5 (3) MG/3ML nebulizer solution 3 mL   methylPREDNISolone  sodium succinate (SOLU-MEDROL ) 125 mg/2 mL injection 125 mg   predniSONE  (DELTASONE ) 50 MG tablet    Sig: Take 1 tablet (50 mg total) by mouth daily with breakfast.    Dispense:  5 tablet    Refill:  0   amoxicillin -clavulanate (AUGMENTIN ) 875-125 MG tablet    Sig: Take 1 tablet by mouth every 12 (twelve) hours.    Dispense:  14 tablet    Refill:  0    -I have reviewed the patients home medicines and have made adjustments as needed   Cardiac Monitoring: The patient was maintained on a cardiac monitor.  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    Final Clinical Impression(s) / ED Diagnoses Final diagnoses:  COPD exacerbation (HCC)     This chart was dictated using voice recognition software.  Despite best efforts to proofread,  errors can occur which can change the documentation meaning.    Mordecai Applebaum, MD 08/12/23 [phone]

## 2023-08-28 DIAGNOSIS — G894 Chronic pain syndrome: Secondary | ICD-10-CM | POA: Diagnosis not present

## 2023-08-28 DIAGNOSIS — J329 Chronic sinusitis, unspecified: Secondary | ICD-10-CM | POA: Diagnosis not present

## 2023-08-28 DIAGNOSIS — J449 Chronic obstructive pulmonary disease, unspecified: Secondary | ICD-10-CM | POA: Diagnosis not present

## 2023-08-28 DIAGNOSIS — I10 Essential (primary) hypertension: Secondary | ICD-10-CM | POA: Diagnosis not present

## 2023-09-04 DIAGNOSIS — J449 Chronic obstructive pulmonary disease, unspecified: Secondary | ICD-10-CM | POA: Diagnosis not present

## 2023-09-04 DIAGNOSIS — M1991 Primary osteoarthritis, unspecified site: Secondary | ICD-10-CM | POA: Diagnosis not present

## 2023-09-04 DIAGNOSIS — I10 Essential (primary) hypertension: Secondary | ICD-10-CM | POA: Diagnosis not present

## 2023-09-04 DIAGNOSIS — G894 Chronic pain syndrome: Secondary | ICD-10-CM | POA: Diagnosis not present

## 2023-09-19 DIAGNOSIS — J449 Chronic obstructive pulmonary disease, unspecified: Secondary | ICD-10-CM | POA: Diagnosis not present

## 2023-09-19 DIAGNOSIS — I700 Atherosclerosis of aorta: Secondary | ICD-10-CM | POA: Diagnosis not present

## 2023-09-21 ENCOUNTER — Other Ambulatory Visit: Payer: Self-pay | Admitting: Urology

## 2023-09-21 DIAGNOSIS — N401 Enlarged prostate with lower urinary tract symptoms: Secondary | ICD-10-CM

## 2023-09-24 DIAGNOSIS — G894 Chronic pain syndrome: Secondary | ICD-10-CM | POA: Diagnosis not present

## 2023-09-24 DIAGNOSIS — M1991 Primary osteoarthritis, unspecified site: Secondary | ICD-10-CM | POA: Diagnosis not present

## 2023-09-24 DIAGNOSIS — J449 Chronic obstructive pulmonary disease, unspecified: Secondary | ICD-10-CM | POA: Diagnosis not present

## 2023-09-24 DIAGNOSIS — Z6824 Body mass index (BMI) 24.0-24.9, adult: Secondary | ICD-10-CM | POA: Diagnosis not present

## 2023-09-24 DIAGNOSIS — I10 Essential (primary) hypertension: Secondary | ICD-10-CM | POA: Diagnosis not present

## 2023-11-10 DIAGNOSIS — I10 Essential (primary) hypertension: Secondary | ICD-10-CM | POA: Diagnosis not present

## 2023-11-10 DIAGNOSIS — J449 Chronic obstructive pulmonary disease, unspecified: Secondary | ICD-10-CM | POA: Diagnosis not present

## 2023-11-10 DIAGNOSIS — G894 Chronic pain syndrome: Secondary | ICD-10-CM | POA: Diagnosis not present

## 2023-11-10 DIAGNOSIS — M1991 Primary osteoarthritis, unspecified site: Secondary | ICD-10-CM | POA: Diagnosis not present

## 2023-11-18 ENCOUNTER — Ambulatory Visit: Payer: Self-pay

## 2023-11-18 VITALS — BP 123/77 | HR 73 | Ht 69.0 in | Wt 174.0 lb

## 2023-11-18 DIAGNOSIS — G894 Chronic pain syndrome: Secondary | ICD-10-CM

## 2023-11-18 DIAGNOSIS — Z79891 Long term (current) use of opiate analgesic: Secondary | ICD-10-CM

## 2023-11-18 NOTE — Progress Notes (Signed)
 New Patient Office Visit  Subjective    Patient ID: [name], male    DOB: Aug 26, 1950  Age: 73 y.o. MRN: [id number]  CC:  Chief Complaint  Patient presents with   Establish Care    HPI [name] presents to establish care.  He was a patient of Dr. Auther.     Past Medical History:  Diagnosis Date   Anxiety    CAD (coronary artery disease)    Chronic pain    COPD (chronic obstructive pulmonary disease) (HCC)    emphysema   Dyslipidemia    History of tobacco abuse    80 pack year history    Hypertension    On home O2    4L N/C   Opioid dependence (HCC)    Respiratory failure (HCC)     Past Surgical History:  Procedure Laterality Date   CARDIAC CATHETERIZATION  08/10/2008   right & left - normal coronaries (Dr. HILARIO Jester)   CATARACT EXTRACTION W/PHACO Left 01/31/2022   Procedure: CATARACT EXTRACTION PHACO AND INTRAOCULAR LENS PLACEMENT (IOC);  Surgeon: Juli Blunt, MD;  Location: [facility];  Service: Ophthalmology;  Laterality: Left;  CDE 5.79   CATARACT EXTRACTION W/PHACO Right 02/14/2022   Procedure: CATARACT EXTRACTION PHACO AND INTRAOCULAR LENS PLACEMENT (IOC);  Surgeon: Juli Blunt, MD;  Location: [facility];  Service: Ophthalmology;  Laterality: Right;  CDE 5.58   FLEXIBLE SIGMOIDOSCOPY  11/13/2020   Procedure: FLEXIBLE SIGMOIDOSCOPY;  Surgeon: Eartha Flavors, Toribio, MD;  Location: [facility];  Service: Gastroenterology;;   NM MYOCAR PERF WALL MOTION  07/2008   bruce myoview - mild ischemia in basal inferoseptal & mid inferoseptal regions; EF 63%   POLYPECTOMY  11/13/2020   Procedure: POLYPECTOMY;  Surgeon: Eartha Flavors, Toribio, MD;  [facility]: [facility];  Service: Gastroenterology;;   TRANSTHORACIC ECHOCARDIOGRAM  07/2008   EF=>55%; RV mildly dilated; RA mild-mod dilated; mild mitral annular calcification & mild MR; AV mildly sclerotic    Family History  Problem Relation Age of Onset   Leukemia Mother    Suicidality Father      Social History   Socioeconomic History   Marital status: Married    Spouse name: Not on file   Number of children: 3   Years of education: 9   Highest education level: Not on file  Occupational History    Employer: UNEMPLOYED  Tobacco Use   Smoking status: Former    Current packs/day: 0.00    Types: Cigarettes    Start date: 04/21/1974    Quit date: 06/22/2007    Years since quitting: 16.4   Smokeless tobacco: Never  Vaping Use   Vaping status: Never Used  Substance and Sexual Activity   Alcohol  use: No    Alcohol /week: 0.0 standard drinks of alcohol    Drug use: No   Sexual activity: Not on file  Other Topics Concern   Not on file  Social History Narrative   Not on file   Social Drivers of Health   Financial Resource Strain: Low Risk  (12/31/2021)   Overall Financial Resource Strain (CARDIA)    Difficulty of Paying Living Expenses: Not very hard  Food Insecurity: Not on file  Transportation Needs: No Transportation Needs (12/31/2021)   PRAPARE - Administrator, Civil Service (Medical): No    Lack of Transportation (Non-Medical): No  Physical Activity: Not on file  Stress: Not on file  Social Connections: Not on file  Intimate Partner Violence: Not  on file    ROS      Objective    BP 123/77   Pulse 73   Ht 5' 9 (1.753 m)   Wt 174 lb (78.9 kg)   SpO2 (!) 89%   BMI 25.70 kg/m   Physical Exam Vitals and nursing note reviewed.  Constitutional:      Appearance: Normal appearance.  HENT:     Head: Normocephalic.  Eyes:     Extraocular Movements: Extraocular movements intact.     Pupils: Pupils are equal, round, and reactive to light.  Cardiovascular:     Rate and Rhythm: Normal rate and regular rhythm.  Pulmonary:     Effort: Pulmonary effort is normal.     Breath sounds: Normal breath sounds.  Musculoskeletal:     Cervical back: Normal range of motion and neck supple.  Neurological:     Mental Status: He is alert and oriented to  person, place, and time.  Psychiatric:        Mood and Affect: Mood normal.        Thought Content: Thought content normal.         Assessment & Plan:   Problem List Items Addressed This Visit       Other   Chronic pain syndrome - Primary (Chronic)   He has been getting Percocet filled by Dr. Bertell, therefore I have agreed to take over prescribing this.  He last had this filled on 10/30/2023.  He states he does not need refills at this time.  We will obtain urine tox screen at this time.  He has signed controlled substance agreement and copy was returned to him.      Relevant Orders   ToxASSURE Select 13 (MW), Urine (Completed)   Long term current use of opiate analgesic (Chronic)   He has been getting Percocet filled by Dr. Bertell, therefore I have agreed to take over prescribing this.  He last had this filled on 10/30/2023.  He states he does not need refills at this time.  We will obtain urine tox screen at this time.  He has signed controlled substance agreement and copy was returned to him.      Relevant Orders   ToxASSURE Select 13 (MW), Urine (Completed)    Return in about 4 months (around 03/20/2024).   Leita Longs, FNP

## 2023-11-19 DIAGNOSIS — G894 Chronic pain syndrome: Secondary | ICD-10-CM | POA: Diagnosis not present

## 2023-11-19 DIAGNOSIS — Z79891 Long term (current) use of opiate analgesic: Secondary | ICD-10-CM | POA: Diagnosis not present

## 2023-11-23 LAB — TOXASSURE SELECT 13 (MW), URINE

## 2023-11-24 ENCOUNTER — Ambulatory Visit: Payer: Self-pay

## 2023-11-24 NOTE — Assessment & Plan Note (Signed)
 He has been getting Percocet filled by Dr. Bertell, therefore I have agreed to take over prescribing this.  He last had this filled on 10/30/2023.  He states he does not need refills at this time.  We will obtain urine tox screen at this time.  He has signed controlled substance agreement and copy was returned to him.

## 2023-12-29 ENCOUNTER — Other Ambulatory Visit: Payer: Self-pay

## 2023-12-29 ENCOUNTER — Telehealth: Payer: Self-pay

## 2023-12-29 DIAGNOSIS — G894 Chronic pain syndrome: Secondary | ICD-10-CM

## 2023-12-29 MED ORDER — OXYCODONE-ACETAMINOPHEN 10-325 MG PO TABS: 1.0000 | ORAL_TABLET | Freq: Four times a day (QID) | ORAL | 0 refills | Status: DC | PRN

## 2023-12-29 NOTE — Telephone Encounter (Signed)
 Patient advised.

## 2023-12-29 NOTE — Telephone Encounter (Signed)
 Prescription Request  12/29/2023  LOV: 11/18/2023  What is the name of the medication or equipment? oxyCODONE -acetaminophen  (PERCOCET) 10-325 MG tablet [[id number]   Have you contacted your pharmacy to request a refill? No   Which pharmacy would you like this sent to? [facility]   Patient notified that their request is being sent to the clinical staff for review and that they should receive a response within 2 business days.   Please advise at walked into offce

## 2023-12-29 NOTE — Telephone Encounter (Signed)
 [name]

## 2024-01-28 ENCOUNTER — Ambulatory Visit

## 2024-01-28 VITALS — BP 113/64 | HR 68 | Ht 69.0 in | Wt 182.1 lb

## 2024-01-28 DIAGNOSIS — E785 Hyperlipidemia, unspecified: Secondary | ICD-10-CM | POA: Diagnosis not present

## 2024-01-28 DIAGNOSIS — Z23 Encounter for immunization: Secondary | ICD-10-CM

## 2024-01-28 DIAGNOSIS — R0789 Other chest pain: Secondary | ICD-10-CM

## 2024-01-28 DIAGNOSIS — G894 Chronic pain syndrome: Secondary | ICD-10-CM | POA: Diagnosis not present

## 2024-01-28 DIAGNOSIS — F1120 Opioid dependence, uncomplicated: Secondary | ICD-10-CM

## 2024-01-28 DIAGNOSIS — I2510 Atherosclerotic heart disease of native coronary artery without angina pectoris: Secondary | ICD-10-CM

## 2024-01-28 DIAGNOSIS — I2583 Coronary atherosclerosis due to lipid rich plaque: Secondary | ICD-10-CM

## 2024-01-28 MED ORDER — OXYCODONE-ACETAMINOPHEN 10-325 MG PO TABS: 1.0000 | ORAL_TABLET | Freq: Four times a day (QID) | ORAL | 0 refills | Status: DC | PRN

## 2024-01-28 NOTE — Progress Notes (Signed)
 Established Patient Office Visit  Subjective   Patient ID: [name], male    DOB: 26-Sep-1950  Age: 73 y.o. MRN: [id number]  Chief Complaint  Patient presents with   Medical Management of Chronic Issues    2 month    HPI Discussed the use of AI scribe software for clinical note transcription with the patient, who gave verbal consent to proceed.  History of Present Illness   [name] is a 73 year old male who presents with right-sided chest pain after an injury.  Right-sided chest pain - Onset Monday after reaching over to retrieve a shoe and feeling a 'pop' - Pain localized to the right side of the chest - Described as sore and persistent since the incident - Anticipates pain may last two to three weeks - No pain with deep inspiration  Recent trauma - Sustained injury to right chest while reaching for a shoe - No additional traumatic events reported        Patient Active Problem List   Diagnosis Date Noted   Rib pain on right side 02/01/2024   Opioid dependence, uncomplicated (HCC) 01/28/2024   C. 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   Vitamin D  insufficiency 04/01/2016   Elevated C-reactive protein (CRP) 03/28/2016   Chronic pain syndrome 03/26/2016   Chronic bilateral low back pain without sciatica (R>L) 03/26/2016   Long term current use of opiate analgesic 03/26/2016   Long term prescription opiate use 03/26/2016   Encounter for therapeutic drug level monitoring 03/26/2016   Encounter for pain management planning 03/26/2016   Chronic sacroiliac joint pain (B) (R>L) 03/26/2016   Chronic hip pain, left 03/26/2016   Chronic right hip pain 03/26/2016   Chronic hip pain (B) (L>R) 03/26/2016   Chronic left sacroiliac pain 03/26/2016    Chronic right sacroiliac pain 03/26/2016   PNA (pneumonia) 12/25/2015   COPD exacerbation (HCC)    Sepsis secondary to C. difficile colitis 07/03/2015   CAP (community acquired pneumonia) 07/03/2015   GERD (gastroesophageal reflux disease) 07/03/2015   Coronary artery disease 10/13/2013   Anxiety 10/13/2013   Dyslipidemia 06/23/2013   COPD (chronic obstructive pulmonary disease) (HCC) 06/23/2013   SHOULDER PAIN 08/16/2008   SHOULDER IMPINGEMENT SYNDROME, LEFT 08/16/2008    ROS    Objective:     BP 113/64   Pulse 68   Ht 5' 9 (1.753 m)   Wt 182 lb 1.3 oz (82.6 kg)   SpO2 (!) 89%   BMI 26.89 kg/m  BP Readings from Last 3 Encounters:  01/28/24 113/64  11/18/23 123/77  08/12/23 118/88   Wt Readings from Last 3 Encounters:  01/28/24 182 lb 1.3 oz (82.6 kg)  11/18/23 174 lb (78.9 kg)  08/12/23 180 lb (81.6 kg)   Physical Exam Vitals and nursing note reviewed.  Constitutional:      Appearance: Normal appearance.  HENT:     Head: Normocephalic.  Eyes:     Extraocular Movements: Extraocular movements intact.     Pupils: Pupils are equal, round, and reactive to light.  Cardiovascular:     Rate and Rhythm: Normal rate and regular rhythm.  Pulmonary:     Effort: Pulmonary effort is normal.     Breath sounds: Normal breath sounds.  Musculoskeletal:     Cervical back: Normal range  of motion and neck supple.  Neurological:     Mental Status: He is alert and oriented to person, place, and time.  Psychiatric:        Mood and Affect: Mood normal.        Thought Content: Thought content normal.       The ASCVD Risk score (Arnett DK, et al., 2019) failed to calculate for the following reasons:   The valid total cholesterol range is 130 to 320 mg/dL    Assessment & Plan:   Problem List Items Addressed This Visit       Cardiovascular and Mediastinum   Coronary artery disease   Check CBC today.  This is managed by cardiology      Relevant Orders   CBC  (Completed)     Other   Chronic pain syndrome (Chronic)   Percocet 10-325 mg reviewed today.  PDMP was reviewed.      Relevant Medications   oxyCODONE -acetaminophen  (PERCOCET) 10-325 MG tablet   Dyslipidemia   Relevant Orders   CMP14+EGFR (Completed)   Lipid Profile (Completed)   Opioid dependence, uncomplicated (HCC)   Long-term use of Percocet for chronic pain.  PDMP was reviewed today      Rib pain on right side - Primary   Right rib pain Significant pain after reaching incident. Possible rib fracture considered. - Order x-ray of right ribs to assess for fracture. - Prescribe pain medication for pain management.      Relevant Orders   DG Ribs Unilateral W/Chest Right   Other Visit Diagnoses       Immunization due       Relevant Orders   Flu vaccine HIGH DOSE PF(Fluzone Trivalent) (Completed)       No follow-ups on file.    Leita Longs, FNP

## 2024-01-29 LAB — CMP14+EGFR
ALT: 18 IU/L (ref 0–44)
AST: 16 IU/L (ref 0–40)
Albumin: 4.5 g/dL (ref 3.8–4.8)
Alkaline Phosphatase: 83 IU/L (ref 47–123)
BUN/Creatinine Ratio: 18 (ref 10–24)
BUN: 20 mg/dL (ref 8–27)
Bilirubin Total: 0.7 mg/dL (ref 0.0–1.2)
CO2: 23 mmol/L (ref 20–29)
Calcium: 9.5 mg/dL (ref 8.6–10.2)
Chloride: 105 mmol/L (ref 96–106)
Creatinine, Ser: 1.09 mg/dL (ref 0.76–1.27)
Globulin, Total: 2 g/dL (ref 1.5–4.5)
Glucose: 108 mg/dL — ABNORMAL HIGH (ref 70–99)
Potassium: 4.7 mmol/L (ref 3.5–5.2)
Sodium: 143 mmol/L (ref 134–144)
Total Protein: 6.5 g/dL (ref 6.0–8.5)
eGFR: 72 mL/min/1.73 (ref >59)

## 2024-01-29 LAB — LIPID PANEL
Chol/HDL Ratio: 2.4 ratio (ref 0.0–5.0)
Cholesterol, Total: 111 mg/dL (ref 100–199)
HDL: 47 mg/dL (ref >39)
LDL Chol Calc (NIH): 50 mg/dL (ref 0–99)
Triglycerides: 62 mg/dL (ref 0–149)
VLDL Cholesterol Cal: 14 mg/dL (ref 5–40)

## 2024-01-29 LAB — CBC
Hematocrit: 42.6 % (ref 37.5–51.0)
Hemoglobin: 13.8 g/dL (ref 13.0–17.7)
MCH: 31.4 pg (ref 26.6–33.0)
MCHC: 32.4 g/dL (ref 31.5–35.7)
MCV: 97 fL (ref 79–97)
Platelets: 243 x10E3/uL (ref 150–450)
RBC: 4.4 x10E6/uL (ref 4.14–5.80)
RDW: 12.1 % (ref 11.6–15.4)
WBC: 6.5 x10E3/uL (ref 3.4–10.8)

## 2024-02-01 ENCOUNTER — Ambulatory Visit: Payer: Self-pay

## 2024-02-01 DIAGNOSIS — R0789 Other chest pain: Secondary | ICD-10-CM | POA: Insufficient documentation

## 2024-02-01 NOTE — Assessment & Plan Note (Signed)
 Long-term use of Percocet for chronic pain.  PDMP was reviewed today

## 2024-02-01 NOTE — Assessment & Plan Note (Signed)
 Percocet 10-325 mg reviewed today.  PDMP was reviewed.

## 2024-02-01 NOTE — Assessment & Plan Note (Addendum)
 Check CBC today.  This is managed by cardiology

## 2024-02-01 NOTE — Assessment & Plan Note (Addendum)
 Right rib pain Significant pain after reaching incident. Possible rib fracture considered. - Order x-ray of right ribs to assess for fracture. - Prescribe pain medication for pain management.

## 2024-02-03 ENCOUNTER — Other Ambulatory Visit: Payer: Self-pay

## 2024-02-03 NOTE — Telephone Encounter (Unsigned)
 Copied from CRM [id number]. Topic: Clinical - Medication Refill >> Feb 03, 2024  9:27 AM Tanazia G wrote: Medication:  rosuvastatin (CRESTOR) 20 MG tablet  Has the patient contacted their pharmacy? Yes (Agent: If no, request that the patient contact the pharmacy for the refill. If patient does not wish to contact the pharmacy document the reason why and proceed with request.) (Agent: If yes, when and what did the pharmacy advise?)  This is the patient's preferred pharmacy:  [facility] - [place], [place] - [address] [place] [address] Phone: [phone] Fax: [phone]  Is this the correct pharmacy for this prescription? Yes If no, delete pharmacy and type the correct one.   Has the prescription been filled recently? Yes  Is the patient out of the medication? Yes  Has the patient been seen for an appointment in the last year OR does the patient have an upcoming appointment? Yes  Can we respond through MyChart? Yes  Agent: Please be advised that Rx refills may take up to 3 business days. We ask that you follow-up with your pharmacy.

## 2024-02-05 ENCOUNTER — Other Ambulatory Visit: Payer: Self-pay

## 2024-02-11 ENCOUNTER — Ambulatory Visit

## 2024-02-11 VITALS — BP 130/70 | Ht 69.0 in | Wt 175.0 lb

## 2024-02-11 DIAGNOSIS — Z1211 Encounter for screening for malignant neoplasm of colon: Secondary | ICD-10-CM

## 2024-02-11 DIAGNOSIS — Z0000 Encounter for general adult medical examination without abnormal findings: Secondary | ICD-10-CM

## 2024-02-11 NOTE — Progress Notes (Signed)
 Please attest and cosign this visit due to patients primary care provider not being immediately available at the time the visit was completed.   Subjective:   [name] is a 73 y.o. who presents for a Medicare Wellness preventive visit. As a reminder, Annual Wellness Visits don't include a physical exam, and some assessments may be limited, especially if this visit is performed virtually. We may recommend an in-person follow-up visit with your provider if needed.  Visit Complete: Virtual I connected with  [name] on 02/11/24 by a audio enabled telemedicine application and verified that I am speaking with the correct person using two identifiers.  Patient Location: Home  Provider Location: Home Office  I discussed the limitations of evaluation and management by telemedicine. The patient expressed understanding and agreed to proceed.  Vital Signs: Because this visit was a virtual/telehealth visit, some criteria may be missing or patient reported. Any vitals not documented were not able to be obtained and vitals that have been documented are patient reported. VideoDeclined- This patient declined Librarian, academic. Therefore the visit was completed with audio only.  Persons Participating in Visit: Patient.  AWV Questionnaire: No: Patient Medicare AWV questionnaire was not completed prior to this visit. Cardiac Risk Factors include: advanced age (>61men, >74 women);dyslipidemia;hypertension;male gender;Other (see comment), Risk factor comments: copd     Objective:    Today's Vitals   02/11/24 1534 02/11/24 1535  BP: 130/70   Weight: 175 lb (79.4 kg)   Height: 5' 9 (1.753 m)   PainSc: 4  4   PainLoc: Rib Cage    Body mass index is 25.84 kg/m.    02/11/2024    3:45 PM 08/12/2023    7:20 AM 02/14/2022    7:07 AM 02/12/2022    4:38 PM 01/31/2022    8:04 AM 01/27/2022    2:59 PM 06/15/2021    8:48 AM  Advanced Directives  Does Patient Have a  Medical Advance Directive? No No No No No No No  Would patient like information on creating a medical advance directive? No - Patient declined  No - Patient declined No - Patient declined No - Patient declined No - Patient declined No - Patient declined    Current Medications (verified) Outpatient Encounter Medications as of 02/11/2024  Medication Sig   albuterol  (PROVENTIL ) (2.5 MG/3ML) 0.083% nebulizer solution Take 3 mLs (2.5 mg total) by nebulization every 6 (six) hours as needed for wheezing or shortness of breath. (Patient taking differently: Take 2.5 mg by nebulization 3 (three) times daily.)   albuterol  (VENTOLIN  HFA) 108 (90 Base) MCG/ACT inhaler Inhale 2 puffs into the lungs every 6 (six) hours as needed for wheezing or shortness of breath.   amLODipine  (NORVASC ) 10 MG tablet Take 1 tablet (10 mg total) by mouth daily.   aspirin  EC 81 MG tablet Take 2 tablets (162 mg total) by mouth daily with breakfast.   finasteride  (PROSCAR ) 5 MG tablet Take 1 tablet (5 mg total) by mouth daily.   furosemide  (LASIX ) 20 MG tablet Take 1 tablet (20 mg total) by mouth daily. Hold for 1 week (Patient taking differently: Take 20 mg by mouth daily.)   ibuprofen  (ADVIL ) 600 MG tablet Take 1 tablet (600 mg total) by mouth every 6 (six) hours as needed.   Multiple Vitamins-Minerals (CENTRUM ADULTS PO) Take 1 capsule by mouth daily.   omeprazole (PRILOSEC) 40 MG capsule Take 40 mg by mouth daily.   ondansetron  (ZOFRAN ) 8 MG  tablet Take 8 mg by mouth every 8 (eight) hours as needed for nausea or vomiting.   oxyCODONE -acetaminophen  (PERCOCET) 10-325 MG tablet Take 1 tablet by mouth 4 (four) times daily as needed for pain.   polyethylene glycol-electrolytes (TRILYTE) 420 g solution Take 4,000 mLs by mouth as directed.   potassium chloride  SA (KLOR-CON ) 20 MEQ tablet Take 20 mEq by mouth daily.   rosuvastatin (CRESTOR) 20 MG tablet TAKE 1 TABLET BY MOUTH ONCE DAILY.   sildenafil  (VIAGRA ) 100 MG tablet Take 1  tablet (100 mg total) by mouth daily as needed for erectile dysfunction.   tamsulosin  (FLOMAX ) 0.4 MG CAPS capsule TAKE 1 CAPUSLE BY MOUTH ONCE IN THE MORNING AND AT BEDTIME.( WILL NEED OFFICE VISIT FOR FUTURE REFILLS)   tiZANidine  (ZANAFLEX ) 4 MG tablet Take 4 mg by mouth 3 (three) times daily as needed for muscle spasms.   vardenafil  (LEVITRA ) 20 MG tablet Take 1 tablet (20 mg total) by mouth daily as needed for erectile dysfunction.   No facility-administered encounter medications on file as of 02/11/2024.    Allergies (verified) Ativan  [lorazepam ] and Trazodone and nefazodone   History: Past Medical History:  Diagnosis Date   Anxiety    CAD (coronary artery disease)    Chronic pain    COPD (chronic obstructive pulmonary disease) (HCC)    emphysema   Dyslipidemia    History of tobacco abuse    80 pack year history    Hypertension    On home O2    4L N/C   Opioid dependence (HCC)    Respiratory failure (HCC)    Past Surgical History:  Procedure Laterality Date   CARDIAC CATHETERIZATION  08/10/2008   right & left - normal coronaries (Dr. HILARIO Jester)   CATARACT EXTRACTION W/PHACO Left 01/31/2022   Procedure: CATARACT EXTRACTION PHACO AND INTRAOCULAR LENS PLACEMENT (IOC);  Surgeon: Juli Blunt, MD;  Location: [facility];  Service: Ophthalmology;  Laterality: Left;  CDE 5.79   CATARACT EXTRACTION W/PHACO Right 02/14/2022   Procedure: CATARACT EXTRACTION PHACO AND INTRAOCULAR LENS PLACEMENT (IOC);  Surgeon: Juli Blunt, MD;  Location: [facility];  Service: Ophthalmology;  Laterality: Right;  CDE 5.58   FLEXIBLE SIGMOIDOSCOPY  11/13/2020   Procedure: FLEXIBLE SIGMOIDOSCOPY;  Surgeon: Eartha Flavors, Toribio, MD;  Location: [facility];  Service: Gastroenterology;;   NM MYOCAR PERF WALL MOTION  07/2008   bruce myoview - mild ischemia in basal inferoseptal & mid inferoseptal regions; EF 63%   POLYPECTOMY  11/13/2020   Procedure: POLYPECTOMY;  Surgeon: Eartha Flavors, Toribio,  MD;  Location: [facility];  Service: Gastroenterology;;   TRANSTHORACIC ECHOCARDIOGRAM  07/2008   EF=>55%; RV mildly dilated; RA mild-mod dilated; mild mitral annular calcification & mild MR; AV mildly sclerotic   Family History  Problem Relation Age of Onset   Leukemia Mother    Suicidality Father    Social History   Socioeconomic History   Marital status: Married    Spouse name: Not on file   Number of children: 3   Years of education: 9   Highest education level: Not on file  Occupational History    Employer: UNEMPLOYED  Tobacco Use   Smoking status: Former    Current packs/day: 0.00    Types: Cigarettes    Start date: 04/21/1974    Quit date: 06/22/2007    Years since quitting: 16.6   Smokeless tobacco: Never  Vaping Use   Vaping status: Never Used  Substance and Sexual Activity   Alcohol   use: No    Alcohol /week: 0.0 standard drinks of alcohol    Drug use: No   Sexual activity: Not on file  Other Topics Concern   Not on file  Social History Narrative   Not on file   Social Drivers of Health   Financial Resource Strain: Low Risk  (02/11/2024)   Overall Financial Resource Strain (CARDIA)    Difficulty of Paying Living Expenses: Not hard at all  Food Insecurity: No Food Insecurity (02/11/2024)   Hunger Vital Sign    Worried About Running Out of Food in the Last Year: Never true    Ran Out of Food in the Last Year: Never true  Transportation Needs: No Transportation Needs (02/11/2024)   PRAPARE - Administrator, Civil Service (Medical): No    Lack of Transportation (Non-Medical): No  Physical Activity: Sufficiently Active (02/11/2024)   Exercise Vital Sign    Days of Exercise per Week: 7 days    Minutes of Exercise per Session: 30 min  Stress: No Stress Concern Present (02/11/2024)   [employer] of Occupational Health - Occupational Stress Questionnaire    Feeling of Stress: Not at all  Social Connections: Moderately Isolated (02/11/2024)    Social Connection and Isolation Panel    Frequency of Communication with Friends and Family: More than three times a week    Frequency of Social Gatherings with Friends and Family: Twice a week    Attends Religious Services: Never    Database administrator or Organizations: No    Attends Engineer, structural: Never    Marital Status: Married    Tobacco Counseling Counseling given: Yes   Clinical Intake: Pre-visit preparation completed: Yes Pain : 0-10 Pain Score: 4  Pain Type: Acute pain Pain Location: Rib cage Pain Orientation: Right Pain Descriptors / Indicators: Aching Pain Onset: 1 to 4 weeks ago Pain Frequency: Constant   BMI - recorded: 25.84 Nutritional Status: BMI 25 -29 Overweight Nutritional Risks: None Diabetes: No No results found for: HGBA1C  How often do you need to have someone help you when you read instructions, pamphlets, or other written materials from your doctor or pharmacy?: 1 - Never Interpreter Needed?: No Information entered by :: Niambi Smoak W CMA (AAMA)  Activities of Daily Living     02/11/2024    3:34 PM  In your present state of health, do you have any difficulty performing the following activities:  Hearing? 0  Vision? 0  Difficulty concentrating or making decisions? 0  Walking or climbing stairs? 0  Dressing or bathing? 0  Doing errands, shopping? 0  Preparing Food and eating ? N  Using the Toilet? N  In the past six months, have you accidently leaked urine? N  Do you have problems with loss of bowel control? N  Managing your Medications? N  Managing your Finances? N  Housekeeping or managing your Housekeeping? N   Patient Care Team: Bevely Doffing, FNP as PCP - General (Family Medicine) Sherrilee Belvie CROME, MD as Consulting Physician (Urology) Delford Maude BROCKS, MD as Consulting Physician (Cardiology) Darroll Anes, DO (Optometry)  I have updated your Care Teams any recent Medical Services you may have received from other  providers in the past year.     Assessment:   This is a routine wellness examination for [place].  Hearing/Vision screen Hearing Screening - Comments:: Patient denies any hearing difficulties.   Vision Screening - Comments:: Wears rx glasses - up to date with routine eye exams  with  Oneil Kawasaki   Goals Addressed               This Visit's Progress     remain active and healthy (pt-stated)          Depression Screen     02/11/2024    3:38 PM 11/18/2023    4:08 PM 03/26/2016   11:23 AM  PHQ 2/9 Scores  PHQ - 2 Score 0 0 0  PHQ- 9 Score 5 6      Fall Risk     02/11/2024    3:46 PM 11/18/2023    4:08 PM 03/26/2016   11:23 AM  Fall Risk   Falls in the past year? 0 0 No   Number falls in past yr: 0 0   Injury with Fall? 0 0   Risk for fall due to : No Fall Risks No Fall Risks   Follow up Falls evaluation completed;Education provided;Falls prevention discussed Falls evaluation completed      Data saved with a previous flowsheet row definition    MEDICARE RISK AT HOME:  Medicare Risk at Home Any stairs in or around the home?: Yes If so, are there any without handrails?: No Home free of loose throw rugs in walkways, pet beds, electrical cords, etc?: Yes Adequate lighting in your home to reduce risk of falls?: Yes Life alert?: No Use of a cane, walker or w/c?: No Grab bars in the bathroom?: No Shower chair or bench in shower?: No Elevated toilet seat or a handicapped toilet?: No  TIMED UP AND GO: Was the test performed?  No  Cognitive Function: 6CIT completed        02/11/2024    3:47 PM  6CIT Screen  What Year? 0 points  What month? 0 points  What time? 0 points  Count back from 20 0 points  Months in reverse 0 points  Repeat phrase 0 points  Total Score 0 points    Immunizations Immunization History  Administered Date(s) Administered   INFLUENZA, HIGH DOSE SEASONAL PF 01/28/2024   Influenza-Unspecified 01/19/2015   Moderna Sars-Covid-2  Vaccination 07/08/2019, 08/20/2019   Pneumococcal Polysaccharide-23 07/04/2015    Screening Tests Health Maintenance  Topic Date Due   Hepatitis C Screening  Never done   DTaP/Tdap/Td (1 - Tdap) Never done   Colonoscopy  Never done   Zoster Vaccines- Shingrix (1 of 2) Never done   COVID-19 Vaccine (3 - 2025-26 season) 12/21/2023   Medicare Annual Wellness (AWV)  02/10/2025   Pneumococcal Vaccine: 50+ Years  Completed   Influenza Vaccine  Completed   Meningococcal B Vaccine  Aged Out    Health Maintenance Health Maintenance Due  Topic Date Due   Hepatitis C Screening  Never done   DTaP/Tdap/Td (1 - Tdap) Never done   Colonoscopy  Never done   Zoster Vaccines- Shingrix (1 of 2) Never done   COVID-19 Vaccine (3 - 2025-26 season) 12/21/2023   Health Maintenance Items Addressed: Referral sent to GI for colonoscopy  Additional Screening: Vision Screening: Recommended annual ophthalmology exams for early detection of glaucoma and other disorders of the eye. Would you like a referral to an eye doctor? No    Dental Screening: Recommended annual dental exams for proper oral hygiene  Community Resource Referral / Chronic Care Management: CRR required this visit?  No   CCM required this visit?  No  Plan:   I have personally reviewed and noted the following in the patient's chart:  Medical and social history Use of alcohol , tobacco or illicit drugs  Current medications and supplements including opioid prescriptions. Patient is currently taking opioid prescriptions. Information provided to patient regarding non-opioid alternatives. Patient advised to discuss non-opioid treatment plan with their provider. Functional ability and status Nutritional status Physical activity Advanced directives List of other physicians Hospitalizations, surgeries, and ER visits in previous 12 months Vitals Screenings to include cognitive, depression, and falls Referrals and appointments  In  addition, I have reviewed and discussed with patient certain preventive protocols, quality metrics, and best practice recommendations. A written personalized care plan for preventive services as well as general preventive health recommendations were provided to patient.   Laray Rivkin, CMA   02/11/2024   After Visit Summary: (MyChart) Due to this being a telephonic visit, the after visit summary with patients personalized plan was offered to patient via MyChart   Notes: Nothing significant to report at this time.

## 2024-02-11 NOTE — Patient Instructions (Signed)
 Mr. Tallarico,  Thank you for taking the time for your Medicare Wellness Visit. I appreciate your continued commitment to your health goals. Please review the care plan we discussed, and feel free to reach out if I can assist you further.  Medicare recommends these wellness visits once per year to help you and your care team stay ahead of potential health issues. These visits are designed to focus on prevention, allowing your provider to concentrate on managing your acute and chronic conditions during your regular appointments.  Please note that Annual Wellness Visits do not include a physical exam. Some assessments may be limited, especially if the visit was conducted virtually. If needed, we may recommend a separate in-person follow-up with your provider.  Wishing you excellent health and many blessings in the year to come!  -Rubee Vega, CMA  Ongoing Care Seeing your primary care provider every 3 to 6 months helps us  monitor your health and provide consistent, personalized care.   Recommended Screenings:  Health Maintenance  Topic Date Due   Hepatitis C Screening  Never done   DTaP/Tdap/Td vaccine (1 - Tdap) Never done   Colon Cancer Screening  Never done   Zoster (Shingles) Vaccine (1 of 2) Never done   COVID-19 Vaccine (3 - 2025-26 season) 12/21/2023   Medicare Annual Wellness Visit  02/10/2025   Pneumococcal Vaccine for age over 103  Completed   Flu Shot  Completed   Meningitis B Vaccine  Aged Out       02/11/2024    3:45 PM  Advanced Directives  Does Patient Have a Medical Advance Directive? No  Would patient like information on creating a medical advance directive? No - Patient declined   Advance Care Planning is important because it: Ensures you receive medical care that aligns with your values, goals, and preferences. Provides guidance to your family and loved ones, reducing the emotional burden of decision-making during critical moments.  Vision: Annual vision screenings are  recommended for early detection of glaucoma, cataracts, and diabetic retinopathy. These exams can also reveal signs of chronic conditions such as diabetes and high blood pressure.  Dental: Annual dental screenings help detect early signs of oral cancer, gum disease, and other conditions linked to overall health, including heart disease and diabetes.  Please see the attached documents for additional preventive care recommendations.

## 2024-02-12 ENCOUNTER — Encounter (INDEPENDENT_AMBULATORY_CARE_PROVIDER_SITE_OTHER): Payer: Self-pay | Admitting: *Deleted

## 2024-02-19 ENCOUNTER — Other Ambulatory Visit: Payer: 59

## 2024-02-19 DIAGNOSIS — N401 Enlarged prostate with lower urinary tract symptoms: Secondary | ICD-10-CM

## 2024-02-20 LAB — PSA: Prostate Specific Ag, Serum: 1.2 ng/mL (ref 0.0–4.0)

## 2024-02-23 ENCOUNTER — Ambulatory Visit: Payer: Self-pay | Admitting: Urology

## 2024-02-26 ENCOUNTER — Ambulatory Visit (INDEPENDENT_AMBULATORY_CARE_PROVIDER_SITE_OTHER): Payer: 59 | Admitting: Urology

## 2024-02-26 ENCOUNTER — Encounter: Payer: Self-pay | Admitting: Urology

## 2024-02-26 VITALS — BP 114/68 | HR 55

## 2024-02-26 DIAGNOSIS — N401 Enlarged prostate with lower urinary tract symptoms: Secondary | ICD-10-CM | POA: Diagnosis not present

## 2024-02-26 DIAGNOSIS — R339 Retention of urine, unspecified: Secondary | ICD-10-CM

## 2024-02-26 DIAGNOSIS — R338 Other retention of urine: Secondary | ICD-10-CM | POA: Diagnosis not present

## 2024-02-26 DIAGNOSIS — N5201 Erectile dysfunction due to arterial insufficiency: Secondary | ICD-10-CM | POA: Diagnosis not present

## 2024-02-26 MED ORDER — SILODOSIN 8 MG PO CAPS: 8.0000 mg | ORAL_CAPSULE | Freq: Every day | ORAL | 11 refills | Status: DC

## 2024-02-26 MED ORDER — FINASTERIDE 5 MG PO TABS
5.0000 mg | ORAL_TABLET | Freq: Every day | ORAL | 3 refills | Status: AC
Start: 2024-02-26 — End: ?

## 2024-02-26 NOTE — Patient Instructions (Signed)

## 2024-02-26 NOTE — Progress Notes (Signed)
 02/26/2024 8:35 AM   [name] [birth year] [id number]  Referring provider: Bertell Satterfield, [name] [address] [place],  [place] [address]  Followup BPH   HPI: [name] is a 72yo here for followup for BPH with urinary retention and erectile dysfunction. PSA 1.2 which is stable. He remains on flomax  0.4mg  BID and finasteride  5mg  daily. IPSS 8 QOL 2. Nocturia 1x. He has urinary frequency every 1-2 hours which are large volumes. He is no longer interested in treatment for his erections.    PMH: Past Medical History:  Diagnosis Date   Anxiety    CAD (coronary artery disease)    Chronic pain    COPD (chronic obstructive pulmonary disease) (HCC)    emphysema   Dyslipidemia    History of tobacco abuse    80 pack year history    Hypertension    On home O2    4L N/C   Opioid dependence (HCC)    Respiratory failure (HCC)     Surgical History: Past Surgical History:  Procedure Laterality Date   CARDIAC CATHETERIZATION  08/10/2008   right & left - normal coronaries (Dr. HILARIO [name])   CATARACT EXTRACTION W/PHACO Left 01/31/2022   Procedure: CATARACT EXTRACTION PHACO AND INTRAOCULAR LENS PLACEMENT (IOC);  Surgeon: Edward Blunt, [name];  Location: [facility];  Service: Ophthalmology;  Laterality: Left;  CDE 5.79   CATARACT EXTRACTION W/PHACO Right 02/14/2022   Procedure: CATARACT EXTRACTION PHACO AND INTRAOCULAR LENS PLACEMENT (IOC);  Surgeon: Edward Blunt, [name];  Location: [facility];  Service: Ophthalmology;  Laterality: Right;  CDE 5.58   FLEXIBLE SIGMOIDOSCOPY  11/13/2020   Procedure: FLEXIBLE SIGMOIDOSCOPY;  Surgeon: [name], Toribio, [name];  Location: [facility];  Service: Gastroenterology;;   NM MYOCAR PERF WALL MOTION  07/2008   bruce [name] - mild ischemia in basal inferoseptal & mid inferoseptal regions; EF 63%   POLYPECTOMY  11/13/2020   Procedure: POLYPECTOMY;  Surgeon: [name] Toribio, [name];  Location: [facility];  Service:  Gastroenterology;;   TRANSTHORACIC ECHOCARDIOGRAM  07/2008   EF=>55%; RV mildly dilated; RA mild-mod dilated; mild mitral annular calcification & mild MR; AV mildly sclerotic    Home Medications:  Allergies as of 02/26/2024       Reactions   Ativan  [lorazepam ] Other (See Comments)   Dry mouth sinus and breathing problems   Trazodone And Nefazodone    Unknown reaction        Medication List        Accurate as of February 26, 2024  8:35 AM. If you have any questions, ask your nurse or doctor.          albuterol  (2.5 MG/3ML) 0.083% nebulizer solution Commonly known as: PROVENTIL  Take 3 mLs (2.5 mg total) by nebulization every 6 (six) hours as needed for wheezing or shortness of breath. What changed: when to take this   albuterol  108 (90 Base) MCG/ACT inhaler Commonly known as: VENTOLIN  HFA Inhale 2 puffs into the lungs every 6 (six) hours as needed for wheezing or shortness of breath. What changed: Another medication with the same name was changed. Make sure you understand how and when to take each.   amLODipine  10 MG tablet Commonly known as: NORVASC  Take 1 tablet (10 mg total) by mouth daily.   aspirin  EC 81 MG tablet Take 2 tablets (162 mg total) by mouth daily with breakfast.   CENTRUM ADULTS PO Take 1 capsule by mouth daily.   finasteride  5 MG tablet Commonly  known as: PROSCAR  Take 1 tablet (5 mg total) by mouth daily.   furosemide  20 MG tablet Commonly known as: LASIX  Take 1 tablet (20 mg total) by mouth daily. Hold for 1 week What changed: additional instructions   ibuprofen  600 MG tablet Commonly known as: ADVIL  Take 1 tablet (600 mg total) by mouth every 6 (six) hours as needed.   omeprazole 40 MG capsule Commonly known as: PRILOSEC Take 40 mg by mouth daily.   ondansetron  8 MG tablet Commonly known as: ZOFRAN  Take 8 mg by mouth every 8 (eight) hours as needed for nausea or vomiting.   oxyCODONE -acetaminophen  10-325 MG tablet Commonly known as:  PERCOCET Take 1 tablet by mouth 4 (four) times daily as needed for pain.   polyethylene glycol-electrolytes 420 g solution Commonly known as: TriLyte Take 4,000 mLs by mouth as directed.   potassium chloride  SA 20 MEQ tablet Commonly known as: KLOR-CON  M Take 20 mEq by mouth daily.   rosuvastatin 20 MG tablet Commonly known as: CRESTOR TAKE 1 TABLET BY MOUTH ONCE DAILY.   sildenafil  100 MG tablet Commonly known as: VIAGRA  Take 1 tablet (100 mg total) by mouth daily as needed for erectile dysfunction.   tamsulosin  0.4 MG Caps capsule Commonly known as: FLOMAX  TAKE 1 CAPUSLE BY MOUTH ONCE IN THE MORNING AND AT BEDTIME.( WILL NEED OFFICE VISIT FOR FUTURE REFILLS)   tiZANidine  4 MG tablet Commonly known as: ZANAFLEX  Take 4 mg by mouth 3 (three) times daily as needed for muscle spasms.   vardenafil  20 MG tablet Commonly known as: LEVITRA  Take 1 tablet (20 mg total) by mouth daily as needed for erectile dysfunction.        Allergies:  Allergies  Allergen Reactions   Ativan  [Lorazepam ] Other (See Comments)    Dry mouth sinus and breathing problems    Trazodone And Nefazodone     Unknown reaction    Family History: Family History  Problem Relation Age of Onset   Leukemia Mother    Suicidality Father     Social History:  reports that he quit smoking about 16 years ago. His smoking use included cigarettes. He started smoking about 49 years ago. He has never used smokeless tobacco. He reports that he does not drink alcohol  and does not use drugs.  ROS: All other review of systems were reviewed and are negative except what is noted above in HPI  Physical Exam: BP 114/68   Pulse (!) 55   Constitutional:  Alert and oriented, No acute distress. HEENT: [place] AT, moist mucus membranes.  Trachea midline, no masses. Cardiovascular: No clubbing, cyanosis, or edema. Respiratory: Normal respiratory effort, no increased work of breathing. GI: Abdomen is soft, nontender,  nondistended, no abdominal masses GU: No CVA tenderness.  Lymph: No cervical or inguinal lymphadenopathy. Skin: No rashes, bruises or suspicious lesions. Neurologic: Grossly intact, no focal deficits, moving all 4 extremities. Psychiatric: Normal mood and affect.  Laboratory Data: Lab Results  Component Value Date   WBC 6.5 01/28/2024   HGB 13.8 01/28/2024   HCT 42.6 01/28/2024   MCV 97 01/28/2024   PLT 243 01/28/2024    Lab Results  Component Value Date   CREATININE 1.09 01/28/2024    Lab Results  Component Value Date   PSA 0.9 11/07/2019    No results found for: TESTOSTERONE  No results found for: HGBA1C  Urinalysis    Component Value Date/Time   COLORURINE YELLOW 12/13/2022 1620   APPEARANCEUR CLEAR 12/13/2022 1620  APPEARANCEUR Clear 12/18/2021 0921   LABSPEC 1.024 12/13/2022 1620   PHURINE 5.0 12/13/2022 1620   GLUCOSEU NEGATIVE 12/13/2022 1620   HGBUR NEGATIVE 12/13/2022 1620   BILIRUBINUR NEGATIVE 12/13/2022 1620   BILIRUBINUR Negative 12/18/2021 0921   KETONESUR NEGATIVE 12/13/2022 1620   PROTEINUR NEGATIVE 12/13/2022 1620   NITRITE NEGATIVE 12/13/2022 1620   LEUKOCYTESUR NEGATIVE 12/13/2022 1620    Lab Results  Component Value Date   LABMICR Comment 12/18/2021   BACTERIA RARE (A) 06/30/2017    Pertinent Imaging:  No results found for this or any previous visit.  No results found for this or any previous visit.  No results found for this or any previous visit.  No results found for this or any previous visit.  No results found for this or any previous visit.  No results found for this or any previous visit.  No results found for this or any previous visit.  No results found for this or any previous visit.   Assessment & Plan:    1. Benign localized prostatic hyperplasia with lower urinary tract symptoms (LUTS) (Primary) -switch to rapaflo 8mg  daily - Urinalysis, Routine w reflex microscopic  2. Urinary retention Finasteride   5mg  daily  3. Erectile dysfunction due to arterial insufficiency Patient defers therapy at this time   No follow-ups on file.  Belvie Clara, [name]  [facility] [place]

## 2024-02-29 ENCOUNTER — Other Ambulatory Visit: Payer: Self-pay | Admitting: Urology

## 2024-02-29 DIAGNOSIS — N401 Enlarged prostate with lower urinary tract symptoms: Secondary | ICD-10-CM

## 2024-02-29 NOTE — Telephone Encounter (Signed)
 Return call to pt. Pt state's that the medication is causing him not to breath well. Pt  state's he only took the medication twice due to not being able to breath. Pt want an alternative medication. Pt is aware a message will be sent to MD, McKenzie on advisement. Voiced udnerstanding

## 2024-02-29 NOTE — Telephone Encounter (Signed)
 Patient states [name] prescribed Silodosin and he cannot breath good with the medication. He has COPD. He would like a call back [phone]

## 2024-03-01 MED ORDER — ALFUZOSIN HCL ER 10 MG PO TB24: 10.0000 mg | ORAL_TABLET | Freq: Two times a day (BID) | ORAL | 3 refills | Status: AC

## 2024-03-01 NOTE — Telephone Encounter (Signed)
 Tried calling pt with no answer,  LVM Try uroxatral 10mg  BID

## 2024-03-02 ENCOUNTER — Other Ambulatory Visit: Payer: Self-pay

## 2024-03-02 NOTE — Telephone Encounter (Signed)
 Verbal from Dr. Sherrilee to D/C Rapaflo and flomax . Pt is made aware of medication change.

## 2024-03-23 ENCOUNTER — Telehealth: Payer: Self-pay

## 2024-03-23 NOTE — Telephone Encounter (Signed)
 Prescription Request  03/23/2024  LOV: Visit date not found  What is the name of the medication or equipment? oxyCODONE -acetaminophen  (PERCOCET) 10-325 MG tablet   Have you contacted your pharmacy to request a refill? Yes   Which pharmacy would you like this sent to?   [facility]     Patient notified that their request is being sent to the clinical staff for review and that they should receive a response within 2 business days.   Please advise at Mobile [phone] (mobile)

## 2024-03-28 ENCOUNTER — Other Ambulatory Visit: Payer: Self-pay

## 2024-03-28 DIAGNOSIS — G894 Chronic pain syndrome: Secondary | ICD-10-CM

## 2024-03-28 MED ORDER — OXYCODONE-ACETAMINOPHEN 10-325 MG PO TABS: 1.0000 | ORAL_TABLET | Freq: Four times a day (QID) | ORAL | 0 refills | Status: DC | PRN

## 2024-03-28 NOTE — Telephone Encounter (Signed)
 Refill sent to pharmacy.

## 2024-03-29 NOTE — Telephone Encounter (Signed)
 Noted, Pt advised with verbal understanding

## 2024-04-11 ENCOUNTER — Other Ambulatory Visit: Payer: Self-pay

## 2024-04-23 ENCOUNTER — Other Ambulatory Visit: Payer: Self-pay

## 2024-04-27 ENCOUNTER — Other Ambulatory Visit: Payer: Self-pay

## 2024-04-27 DIAGNOSIS — G894 Chronic pain syndrome: Secondary | ICD-10-CM

## 2024-04-29 ENCOUNTER — Other Ambulatory Visit: Payer: Self-pay

## 2024-05-05 ENCOUNTER — Other Ambulatory Visit: Payer: Self-pay

## 2024-05-06 ENCOUNTER — Other Ambulatory Visit: Payer: Self-pay

## 2024-05-25 ENCOUNTER — Other Ambulatory Visit: Payer: Self-pay

## 2024-05-25 DIAGNOSIS — G894 Chronic pain syndrome: Secondary | ICD-10-CM

## 2024-05-27 ENCOUNTER — Other Ambulatory Visit: Payer: Self-pay

## 2024-07-28 ENCOUNTER — Ambulatory Visit

## 2025-02-14 ENCOUNTER — Ambulatory Visit

## 2025-02-17 ENCOUNTER — Other Ambulatory Visit

## 2025-02-24 ENCOUNTER — Ambulatory Visit: Admitting: Urology
